# Patient Record
Sex: Male | Born: 1960 | Race: White | Hispanic: No | Marital: Single | Smoking: Former smoker
Health system: Southern US, Community
[De-identification: ages and names within clinical notes are randomized; demographics above are authoritative.]

## PROBLEM LIST (undated history)

## (undated) DIAGNOSIS — C20 Malignant neoplasm of rectum: Secondary | ICD-10-CM

## (undated) DIAGNOSIS — M179 Osteoarthritis of knee, unspecified: Secondary | ICD-10-CM

## (undated) DIAGNOSIS — K625 Hemorrhage of anus and rectum: Secondary | ICD-10-CM

## (undated) DIAGNOSIS — C801 Malignant (primary) neoplasm, unspecified: Secondary | ICD-10-CM

## (undated) DIAGNOSIS — R45851 Suicidal ideations: Secondary | ICD-10-CM

## (undated) DIAGNOSIS — F4320 Adjustment disorder, unspecified: Secondary | ICD-10-CM

## (undated) DIAGNOSIS — F418 Other specified anxiety disorders: Secondary | ICD-10-CM

## (undated) HISTORY — DX: Malignant (primary) neoplasm, unspecified: C80.1

## (undated) HISTORY — PX: ENTEROSTOMY CLOSURE: SUR260

## (undated) HISTORY — PX: HAND TENDON SURGERY: SHX663

## (undated) HISTORY — PX: CREATION / REVISION OF ILEOSTOMY / JEJUNOSTOMY: SUR354

## (undated) HISTORY — PX: PROCTECTOMY: SHX315

## (undated) HISTORY — PX: TONSILLECTOMY: SUR1361

## (undated) HISTORY — PX: FLEXIBLE SIGMOIDOSCOPY: SHX1649

---

## 2003-03-28 HISTORY — PX: OPEN REDUCTION INTERNAL FIXATION (ORIF) TIBIA/FIBULA FRACTURE: SHX5992

## 2004-01-24 ENCOUNTER — Emergency Department: Payer: Self-pay | Admitting: Emergency Medicine

## 2004-06-07 ENCOUNTER — Emergency Department: Payer: Self-pay | Admitting: Emergency Medicine

## 2005-12-02 ENCOUNTER — Emergency Department: Payer: Self-pay | Admitting: Emergency Medicine

## 2007-04-08 ENCOUNTER — Emergency Department: Payer: Self-pay | Admitting: Emergency Medicine
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
## 2008-10-23 ENCOUNTER — Emergency Department: Payer: Self-pay | Admitting: Emergency Medicine

## 2009-11-15 ENCOUNTER — Emergency Department: Payer: Self-pay | Admitting: Emergency Medicine
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
## 2010-03-06 ENCOUNTER — Emergency Department: Payer: Self-pay | Admitting: Emergency Medicine
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
## 2010-03-15 ENCOUNTER — Emergency Department: Payer: Self-pay | Admitting: Emergency Medicine

## 2010-10-11 ENCOUNTER — Emergency Department: Payer: Self-pay | Admitting: Emergency Medicine

## 2015-09-29 ENCOUNTER — Ambulatory Visit
Admission: RE | Admit: 2015-09-29 | Discharge: 2015-09-29 | Disposition: A | Discharge status: Home / Self Care | Payer: Disability Insurance | Source: Ambulatory Visit | Attending: Internal Medicine | Admitting: Internal Medicine

## 2015-09-29 ENCOUNTER — Other Ambulatory Visit: Payer: Self-pay | Admitting: Internal Medicine

## 2015-09-29 DIAGNOSIS — M25562 Pain in left knee: Secondary | ICD-10-CM

## 2015-09-29 DIAGNOSIS — Z9889 Other specified postprocedural states: Secondary | ICD-10-CM | POA: Insufficient documentation

## 2015-09-29 DIAGNOSIS — M1711 Unilateral primary osteoarthritis, right knee: Secondary | ICD-10-CM | POA: Insufficient documentation

## 2015-09-29 DIAGNOSIS — Z96653 Presence of artificial knee joint, bilateral: Secondary | ICD-10-CM

## 2015-09-29 DIAGNOSIS — M25561 Pain in right knee: Secondary | ICD-10-CM

## 2015-09-29 DIAGNOSIS — M2341 Loose body in knee, right knee: Secondary | ICD-10-CM | POA: Diagnosis not present
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
## 2016-07-25 ENCOUNTER — Emergency Department: Payer: Medicaid Other

## 2016-07-25 ENCOUNTER — Emergency Department
Admission: EM | Admit: 2016-07-25 | Discharge: 2016-07-25 | Disposition: A | Discharge status: Home / Self Care | Payer: Medicaid Other | Attending: Emergency Medicine | Admitting: Emergency Medicine

## 2016-07-25 DIAGNOSIS — S0240DA Maxillary fracture, left side, initial encounter for closed fracture: Secondary | ICD-10-CM

## 2016-07-25 DIAGNOSIS — Y999 Unspecified external cause status: Secondary | ICD-10-CM | POA: Diagnosis not present

## 2016-07-25 DIAGNOSIS — S060X9A Concussion with loss of consciousness of unspecified duration, initial encounter: Secondary | ICD-10-CM

## 2016-07-25 DIAGNOSIS — Y9259 Other trade areas as the place of occurrence of the external cause: Secondary | ICD-10-CM | POA: Diagnosis not present

## 2016-07-25 DIAGNOSIS — Z23 Encounter for immunization: Secondary | ICD-10-CM | POA: Insufficient documentation

## 2016-07-25 DIAGNOSIS — Y9389 Activity, other specified: Secondary | ICD-10-CM | POA: Diagnosis not present

## 2016-07-25 DIAGNOSIS — F10120 Alcohol abuse with intoxication, uncomplicated: Secondary | ICD-10-CM | POA: Insufficient documentation

## 2016-07-25 DIAGNOSIS — F10929 Alcohol use, unspecified with intoxication, unspecified: Secondary | ICD-10-CM

## 2016-07-25 DIAGNOSIS — S0990XA Unspecified injury of head, initial encounter: Secondary | ICD-10-CM | POA: Diagnosis present

## 2016-07-25 LAB — HEPATIC FUNCTION PANEL
ALBUMIN: 4.5 g/dL (ref 3.5–5.0)
ALT: 44 U/L (ref 17–63)
AST: 46 U/L — ABNORMAL HIGH (ref 15–41)
Alkaline Phosphatase: 78 U/L (ref 38–126)
Bilirubin, Direct: 0.1 mg/dL (ref 0.1–0.5)
Indirect Bilirubin: 0.4 mg/dL (ref 0.3–0.9)
TOTAL PROTEIN: 7.7 g/dL (ref 6.5–8.1)
Total Bilirubin: 0.5 mg/dL (ref 0.3–1.2)

## 2016-07-25 LAB — BASIC METABOLIC PANEL
Anion gap: 6 (ref 5–15)
BUN: 7 mg/dL (ref 6–20)
CALCIUM: 8.7 mg/dL — AB (ref 8.9–10.3)
CO2: 28 mmol/L (ref 22–32)
CREATININE: 0.75 mg/dL (ref 0.61–1.24)
Chloride: 106 mmol/L (ref 101–111)
GFR calc non Af Amer: >60 mL/min (ref >60)
Glucose, Bld: 106 mg/dL — ABNORMAL HIGH (ref 65–99)
Potassium: 4 mmol/L (ref 3.5–5.1)
SODIUM: 140 mmol/L (ref 135–145)

## 2016-07-25 LAB — CBC WITH DIFFERENTIAL/PLATELET
BASOS ABS: 0.1 10*3/uL (ref 0–0.1)
Basophils Relative: 1 %
EOS PCT: 0 %
Eosinophils Absolute: 0 10*3/uL (ref 0–0.7)
HEMATOCRIT: 40.1 % (ref 40.0–52.0)
Hemoglobin: 14 g/dL (ref 13.0–18.0)
LYMPHS PCT: 19 %
Lymphs Abs: 1.7 10*3/uL (ref 1.0–3.6)
MCH: 33.1 pg (ref 26.0–34.0)
MCHC: 34.9 g/dL (ref 32.0–36.0)
MCV: 95 fL (ref 80.0–100.0)
Monocytes Absolute: 0.5 10*3/uL (ref 0.2–1.0)
Monocytes Relative: 6 %
NEUTROS ABS: 6.5 10*3/uL (ref 1.4–6.5)
Neutrophils Relative %: 74 %
PLATELETS: 258 10*3/uL (ref 150–440)
RBC: 4.22 MIL/uL — ABNORMAL LOW (ref 4.40–5.90)
RDW: 13.4 % (ref 11.5–14.5)
WBC: 8.8 10*3/uL (ref 3.8–10.6)

## 2016-07-25 LAB — PROTIME-INR
INR: 1.03
Prothrombin Time: 13.5 seconds (ref 11.4–15.2)

## 2016-07-25 LAB — ETHANOL: ALCOHOL ETHYL (B): 226 mg/dL — AB (ref <5)
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
## 2016-07-25 MED ORDER — HYDROCODONE-ACETAMINOPHEN 5-325 MG PO TABS
2.0000 | ORAL_TABLET | Freq: Once | ORAL | Status: AC
  Administered 2016-07-25: 2 via ORAL
  Filled 2016-07-25: qty 2

## 2016-07-25 MED ORDER — AMOXICILLIN-POT CLAVULANATE 875-125 MG PO TABS
1.0000 | ORAL_TABLET | Freq: Once | ORAL | Status: AC
  Administered 2016-07-25: 1 via ORAL
  Filled 2016-07-25: qty 1

## 2016-07-25 MED ORDER — TETANUS-DIPHTH-ACELL PERTUSSIS 5-2.5-18.5 LF-MCG/0.5 IM SUSP
0.5000 mL | Freq: Once | INTRAMUSCULAR | Status: AC
  Administered 2016-07-25: 0.5 mL via INTRAMUSCULAR
  Filled 2016-07-25: qty 0.5

## 2016-07-25 MED ORDER — HYDROCODONE-ACETAMINOPHEN 5-325 MG PO TABS: 1.0000 | ORAL_TABLET | ORAL | 0 refills | Status: DC | PRN

## 2016-07-25 MED ORDER — AMOXICILLIN-POT CLAVULANATE 875-125 MG PO TABS: 1.0000 | ORAL_TABLET | Freq: Two times a day (BID) | ORAL | 0 refills | Status: AC
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
## 2016-09-11 DIAGNOSIS — B1920 Unspecified viral hepatitis C without hepatic coma: Secondary | ICD-10-CM | POA: Insufficient documentation

## 2019-01-07 ENCOUNTER — Other Ambulatory Visit: Payer: Self-pay

## 2019-01-07 DIAGNOSIS — Z20822 Contact with and (suspected) exposure to covid-19: Secondary | ICD-10-CM

## 2019-01-09 ENCOUNTER — Telehealth: Payer: Self-pay | Admitting: General Practice

## 2019-01-09 LAB — NOVEL CORONAVIRUS, NAA: SARS-CoV-2, NAA: NOT DETECTED

## 2019-01-09 NOTE — Telephone Encounter (Signed)
Negative COVID results given. Patient results "NOT Detected." Caller expressed understanding. ° °

## 2019-12-03 ENCOUNTER — Encounter: Payer: Self-pay | Admitting: *Deleted

## 2020-07-28 DIAGNOSIS — F319 Bipolar disorder, unspecified: Secondary | ICD-10-CM | POA: Insufficient documentation

## 2020-10-15 DIAGNOSIS — I609 Nontraumatic subarachnoid hemorrhage, unspecified: Secondary | ICD-10-CM

## 2020-10-15 HISTORY — DX: Nontraumatic subarachnoid hemorrhage, unspecified: I60.9

## 2020-10-15 HISTORY — PX: CLOSED REDUCTION HUMERAL EPICONDYLE FRACTURE: SUR222

## 2020-10-15 HISTORY — PX: CLOSED REDUCTION CLAVICULAR: SHX5006

## 2020-10-16 DIAGNOSIS — S065XAA Traumatic subdural hemorrhage with loss of consciousness status unknown, initial encounter: Secondary | ICD-10-CM | POA: Insufficient documentation

## 2020-10-19 DIAGNOSIS — F19980 Other psychoactive substance use, unspecified with psychoactive substance-induced anxiety disorder: Secondary | ICD-10-CM | POA: Insufficient documentation

## 2020-10-21 DIAGNOSIS — S42009A Fracture of unspecified part of unspecified clavicle, initial encounter for closed fracture: Secondary | ICD-10-CM | POA: Insufficient documentation

## 2020-11-22 ENCOUNTER — Other Ambulatory Visit: Payer: Self-pay | Admitting: Neurosurgery

## 2020-11-22 DIAGNOSIS — S065X9A Traumatic subdural hemorrhage with loss of consciousness of unspecified duration, initial encounter: Secondary | ICD-10-CM

## 2020-11-22 DIAGNOSIS — S065XAA Traumatic subdural hemorrhage with loss of consciousness status unknown, initial encounter: Secondary | ICD-10-CM

## 2020-12-13 ENCOUNTER — Other Ambulatory Visit: Payer: Self-pay

## 2020-12-13 ENCOUNTER — Ambulatory Visit
Admission: RE | Admit: 2020-12-13 | Discharge: 2020-12-13 | Disposition: A | Discharge status: Home / Self Care | Payer: Medicaid Other | Source: Ambulatory Visit | Attending: Neurosurgery | Admitting: Neurosurgery

## 2020-12-13 DIAGNOSIS — S065XAA Traumatic subdural hemorrhage with loss of consciousness status unknown, initial encounter: Secondary | ICD-10-CM

## 2020-12-13 DIAGNOSIS — S065X9A Traumatic subdural hemorrhage with loss of consciousness of unspecified duration, initial encounter: Secondary | ICD-10-CM | POA: Diagnosis present

## 2020-12-13 IMAGING — CT CT HEAD W/O CM
3 of 4 series · 13 of 47 positions shown, 15 images · non-contrast
Comparison: [DATE]

CLINICAL DATA: Dizziness

EXAM:
CT HEAD WITHOUT CONTRAST
TECHNIQUE: Contiguous axial images were obtained from the base of the skull
through the vertex without intravenous contrast.

[Series 2: axial st head 5.00 ax · axial · 0.33mm/px · z∈[-627,-529]mm · 7 of 28 slices shown, 9 images]
[im 4/28  brain]
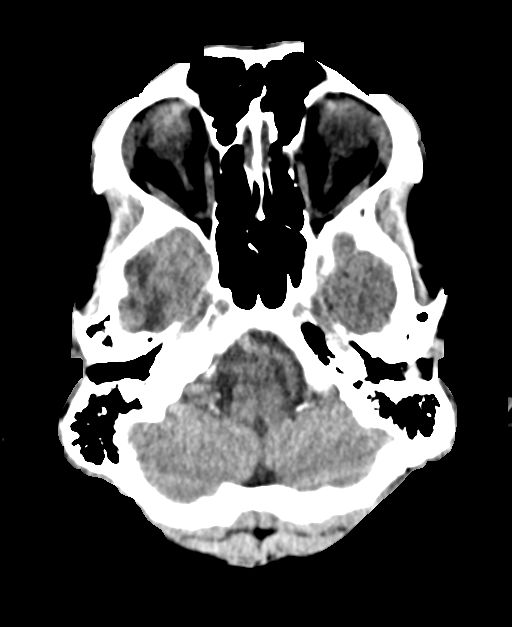
[im 4/28  bone]
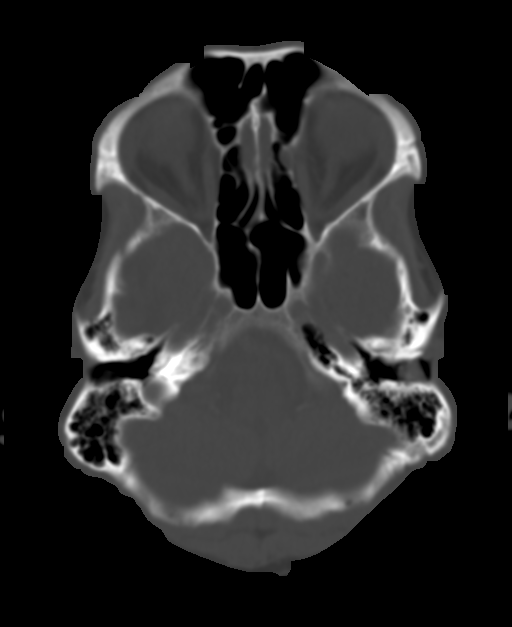
[im 7/28  brain]
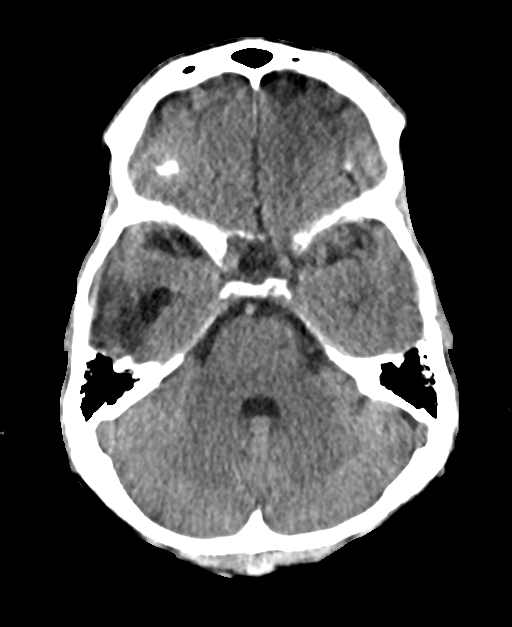
[im 11/28  brain]
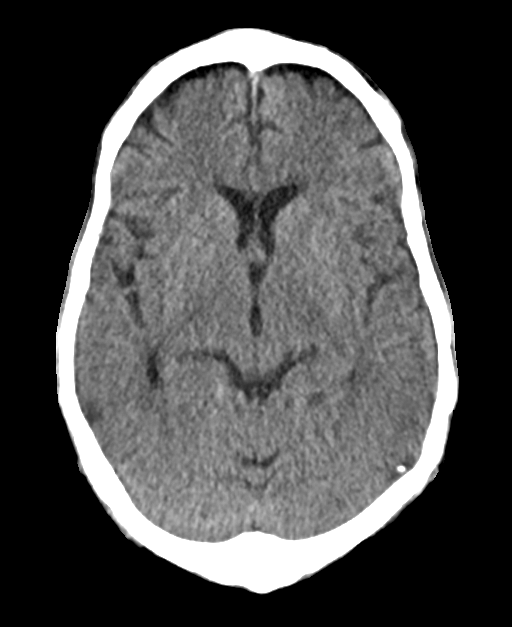
[im 14/28  brain]
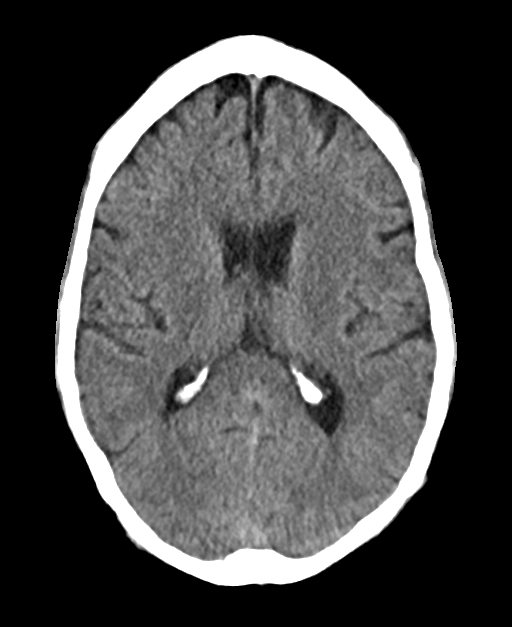
[im 17/28  brain]
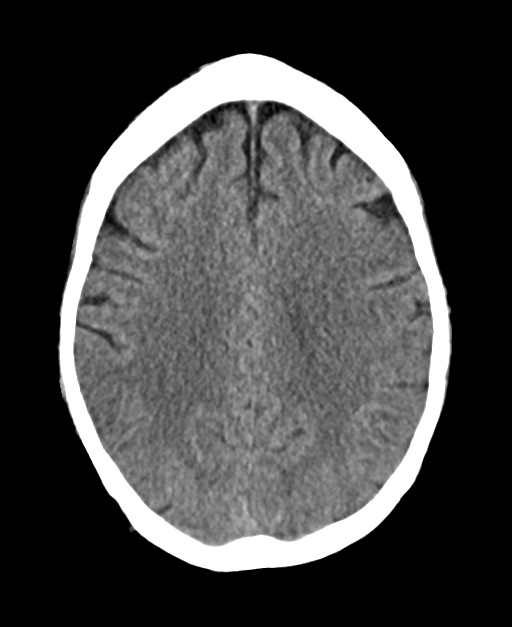
[im 17/28  bone]
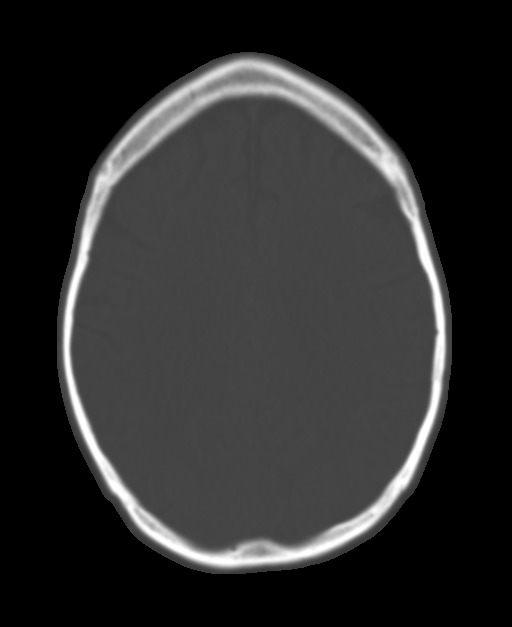
[im 21/28  brain]
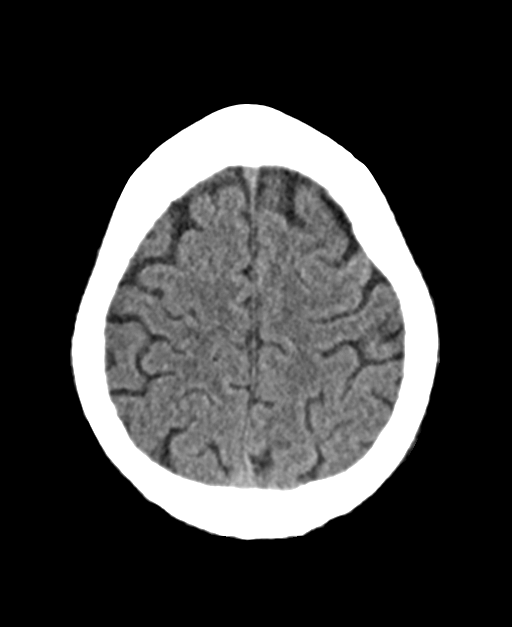
[im 24/28  brain]
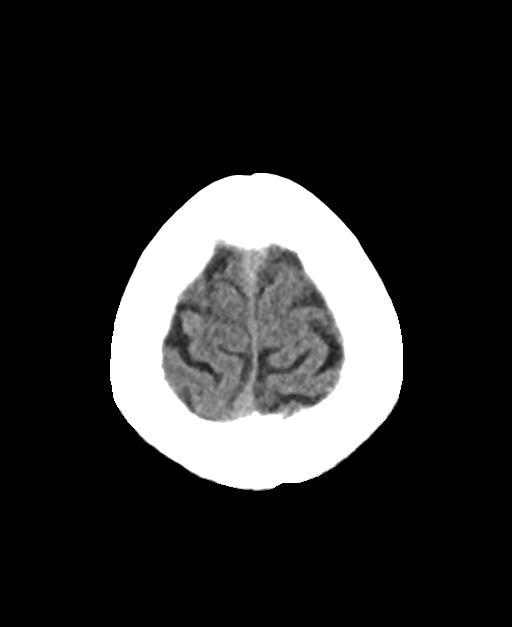

[Series 6: coronals head 3.00 cor · coronal · 0.28mm/px · 3 of 69 slices shown]
[im 23/69  brain]
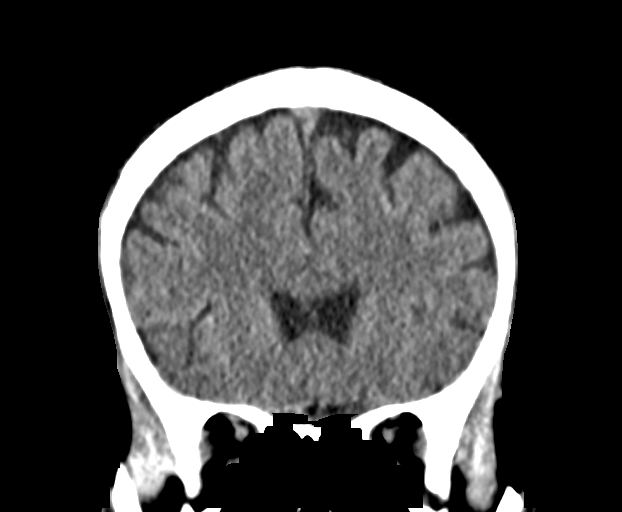
[im 31/69  brain]
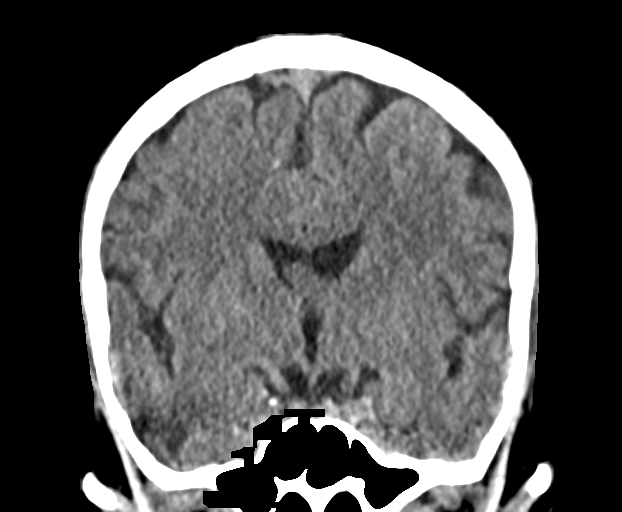
[im 38/69  brain]
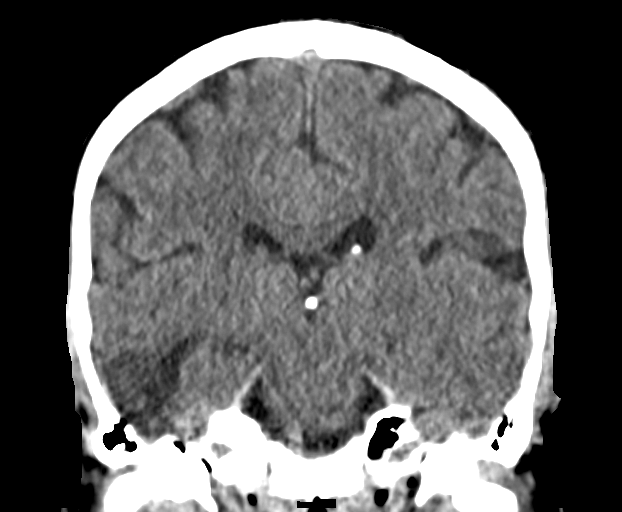

[Series 8: sagittals head 3.00 sag · sagittal · 0.28mm/px · 3 of 57 slices shown]
[im 19/57  brain]
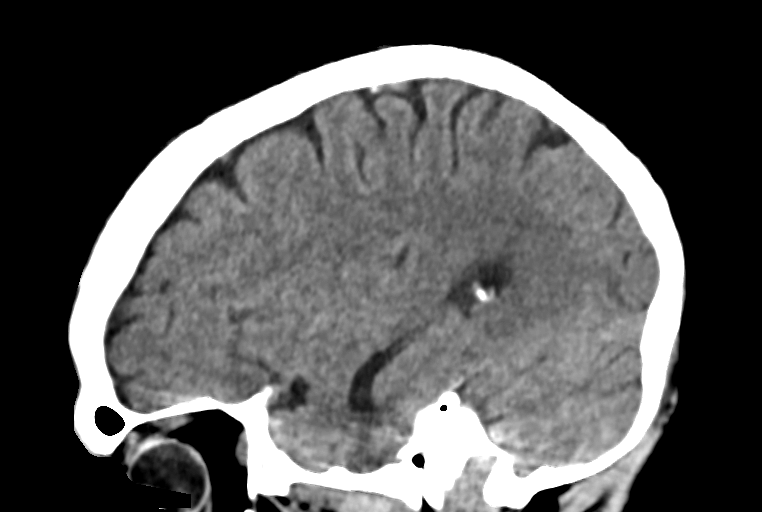
[im 29/57  brain]
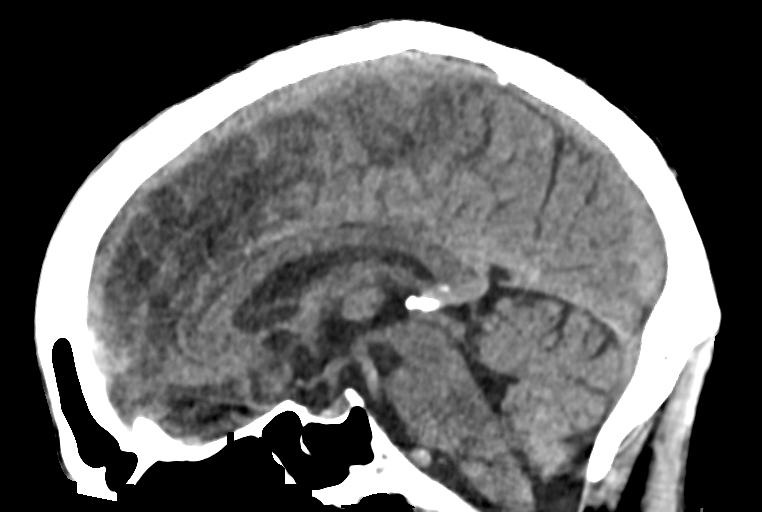
[im 38/57  brain]
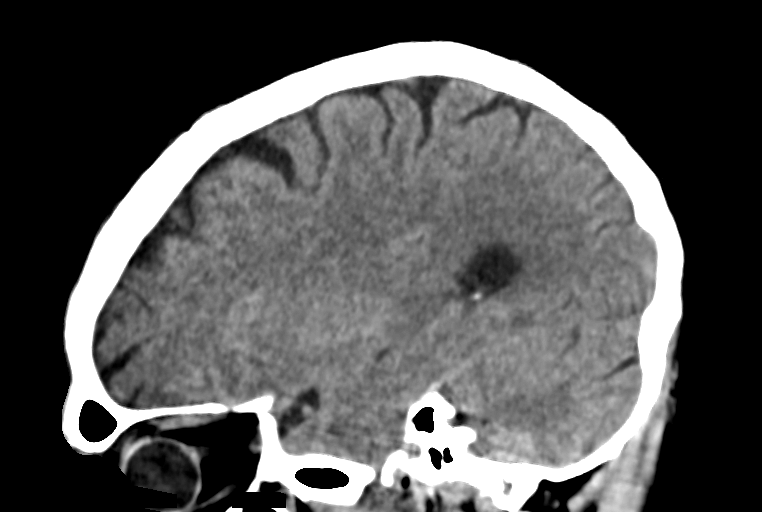

[13 of 47 positions shown; findings below may reference images not displayed]

FINDINGS: Brain: Low-density in the right temporal lobe compatible with
infarct. This is age indeterminate, favor subacute to chronic. The
temporal horn of the right lateral ventricle is mildly dilated,
likely related to ex vacuo dilatation of the infarct. No hemorrhage
or hydrocephalus. No mass effect or midline shift.

Vascular: No hyperdense vessel or unexpected calcification.

Skull: No acute calvarial abnormality.

Sinuses/Orbits: Old left medial orbital wall blowout fracture,
stable since prior study. No acute findings

Other: None
IMPRESSION: Age indeterminate right temporal infarct, favor subacute to chronic
given dilatation of the temporal horn of the lateral ventricle. This
could be further evaluated with MRI if felt clinically indicated.

These results will be called to the ordering clinician or
representative by the Radiologist Assistant, and communication
documented in the PACS or [REDACTED].

## 2020-12-15 ENCOUNTER — Other Ambulatory Visit: Payer: Self-pay | Admitting: Neurosurgery

## 2020-12-15 DIAGNOSIS — H9313 Tinnitus, bilateral: Secondary | ICD-10-CM

## 2020-12-15 DIAGNOSIS — S065XAA Traumatic subdural hemorrhage with loss of consciousness status unknown, initial encounter: Secondary | ICD-10-CM

## 2020-12-15 DIAGNOSIS — S065X9A Traumatic subdural hemorrhage with loss of consciousness of unspecified duration, initial encounter: Secondary | ICD-10-CM

## 2020-12-29 ENCOUNTER — Ambulatory Visit
Admission: RE | Admit: 2020-12-29 | Discharge: 2020-12-29 | Disposition: A | Discharge status: Home / Self Care | Payer: Medicaid Other | Source: Ambulatory Visit | Attending: Neurosurgery | Admitting: Neurosurgery

## 2020-12-29 ENCOUNTER — Other Ambulatory Visit: Payer: Self-pay

## 2020-12-29 DIAGNOSIS — H9313 Tinnitus, bilateral: Secondary | ICD-10-CM | POA: Insufficient documentation

## 2020-12-29 DIAGNOSIS — S065XAA Traumatic subdural hemorrhage with loss of consciousness status unknown, initial encounter: Secondary | ICD-10-CM | POA: Diagnosis present

## 2020-12-29 IMAGING — MR MR HEAD W/O CM
12 series · 44 of 48 positions shown · non-contrast
Comparison: Prior head CT examinations [DATE] and earlier.

CLINICAL DATA: Subdural hematoma.  Tinnitus of both ears.

EXAM:
MRI HEAD WITHOUT CONTRAST
TECHNIQUE: Multiplanar, multiecho pulse sequences of the brain and surrounding
structures were obtained without intravenous contrast.

[Series 5: ax dwi_tracew · axial · 3.0mm · 0.65mm/px · z∈[-76,+78]mm · 4 of 48 slices shown]
[im 1/48]
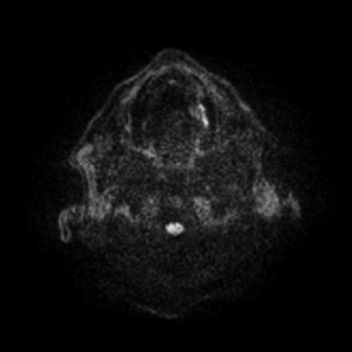
[im 16/48]
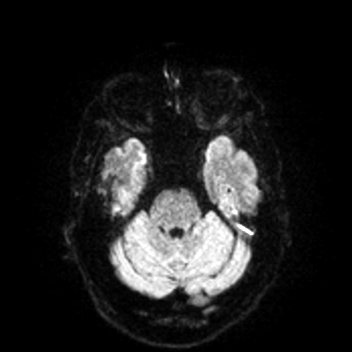
[im 32/48]
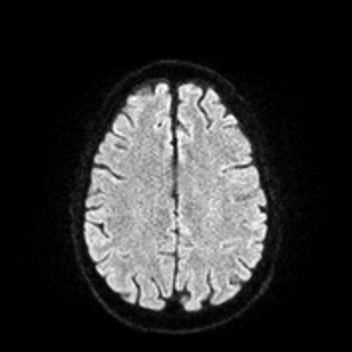
[im 48/48]
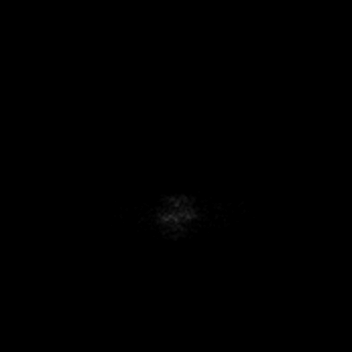

[Series 6: ax dwi_adc · axial · 3.0mm · 0.65mm/px · z∈[-76,+78]mm · 4 of 48 slices shown]
[im 1/48]
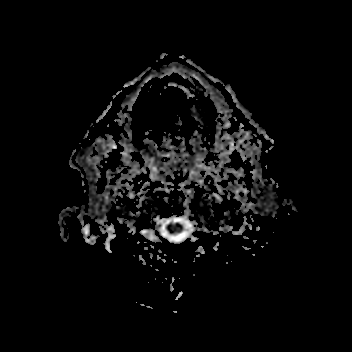
[im 16/48]
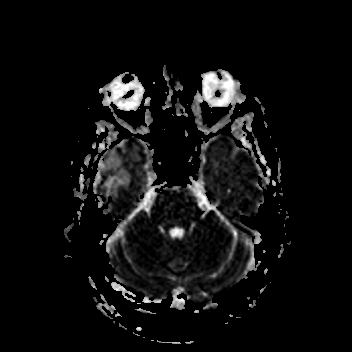
[im 32/48]
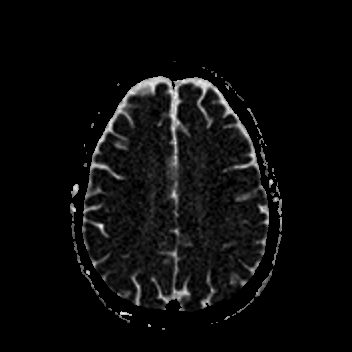
[im 48/48]
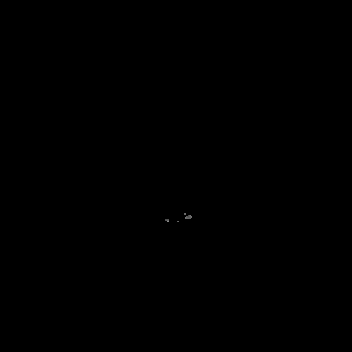

[Series 7: cor dwi_tracew · coronal · 5.0mm · 0.60mm/px · 3 of 38 slices shown]
[im 1/38]
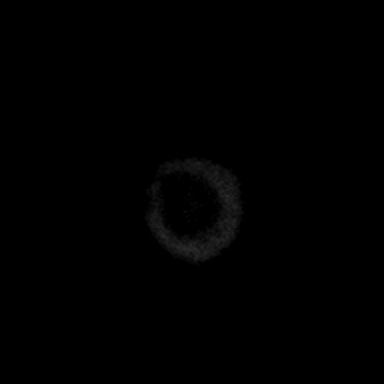
[im 19/38]
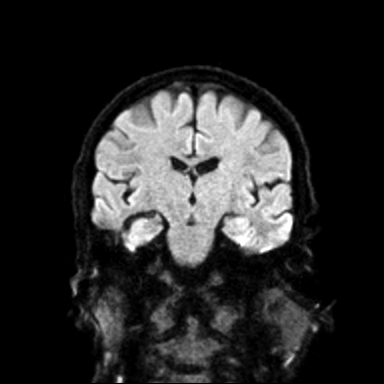
[im 38/38]
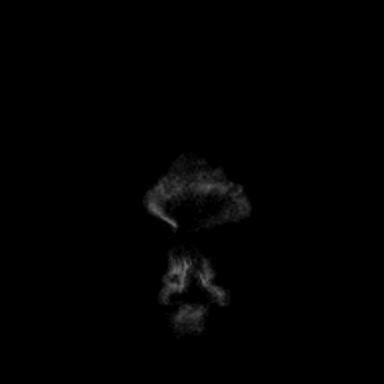

[Series 8: cor dwi_adc · coronal · 5.0mm · 0.60mm/px · 3 of 38 slices shown]
[im 1/38]
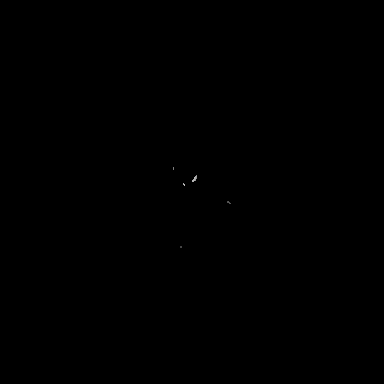
[im 19/38]
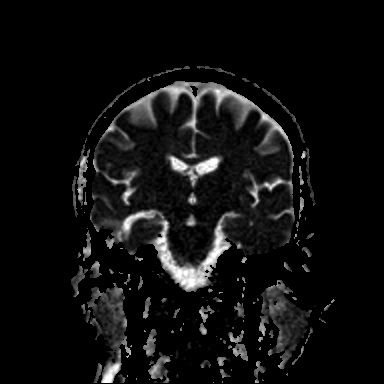
[im 38/38]
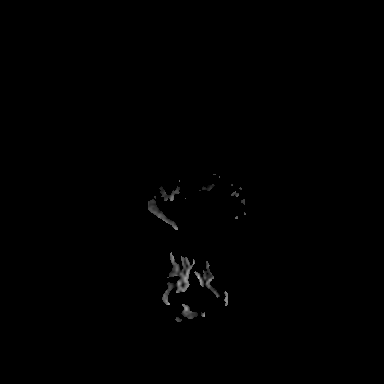

[Series 9: T1 · sagittal · 5.0mm · 0.62mm/px · 2 of 23 slices shown (1 of 2)]
[im 1/23]
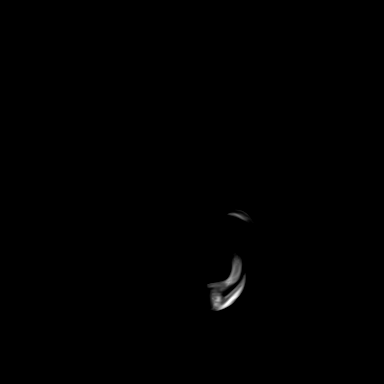
[im 23/23]
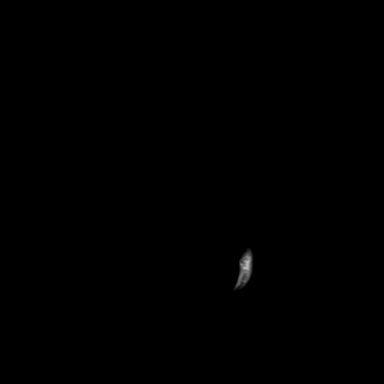

[Series 10: T2 · axial · 5.0mm · 0.53mm/px · z∈[-77,+77]mm · 2 of 27 slices shown (1 of 2)]
[im 1/27]
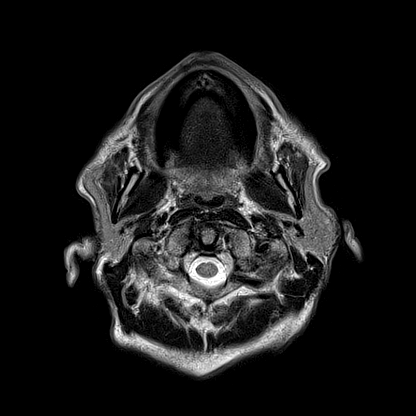
[im 27/27]
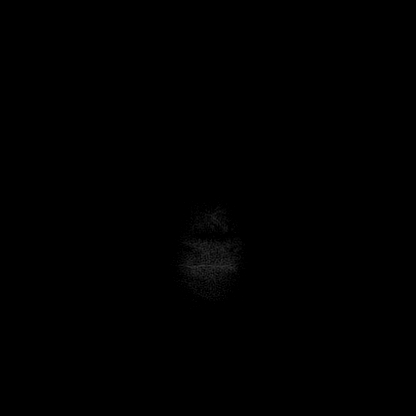

[Series 11: mag_images · axial · 3.0mm · 0.90mm/px · z∈[-87,+88]mm · 4 of 60 slices shown]
[im 1/60]
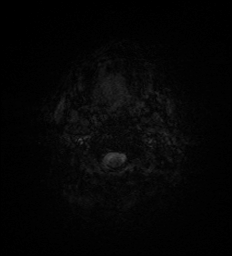
[im 20/60]
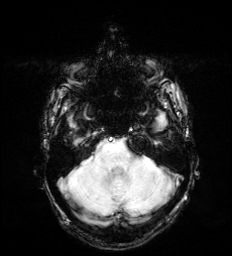
[im 40/60]
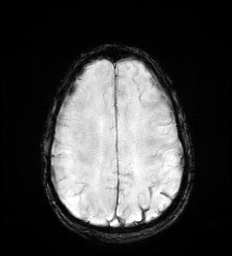
[im 60/60]
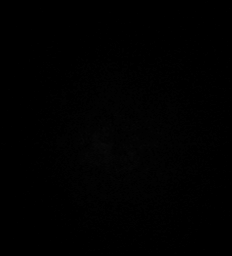

[Series 12: pha_images · axial · 3.0mm · 0.90mm/px · z∈[-87,+88]mm · 4 of 58 slices shown]
[im 1/58]
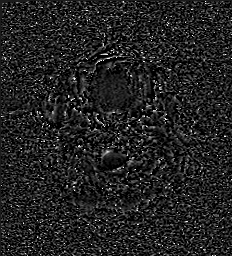
[im 20/58]
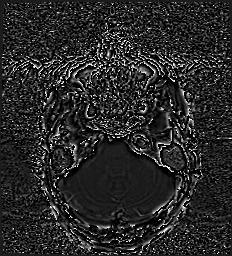
[im 39/58]
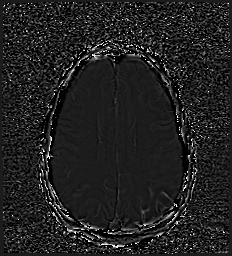
[im 58/58]
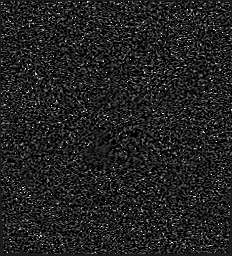

[Series 13: swi_images · axial · 3.0mm · 0.90mm/px · z∈[-87,+88]mm · 4 of 60 slices shown]
[im 1/60]
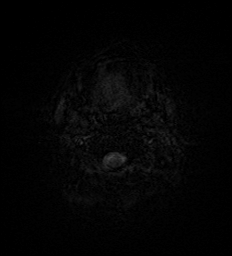
[im 20/60]
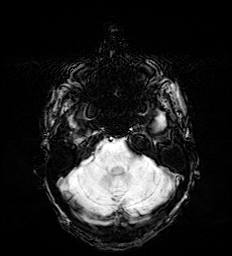
[im 40/60]
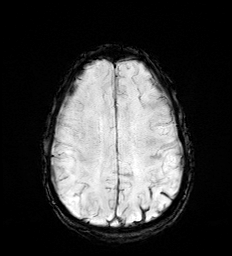
[im 60/60]
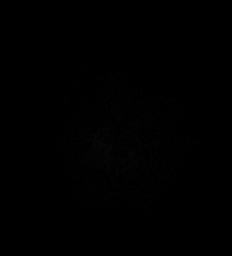

[Series 15: FLAIR · axial · 3.0mm · 0.53mm/px · z∈[-80,+80]mm · 4 of 55 slices shown]
[im 1/55]
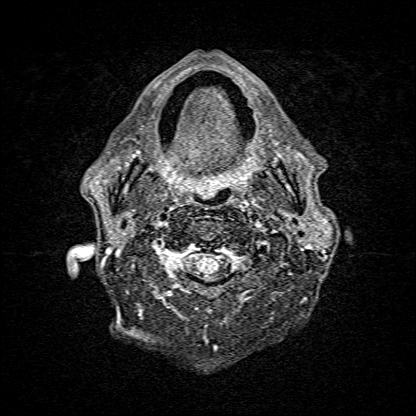
[im 19/55]
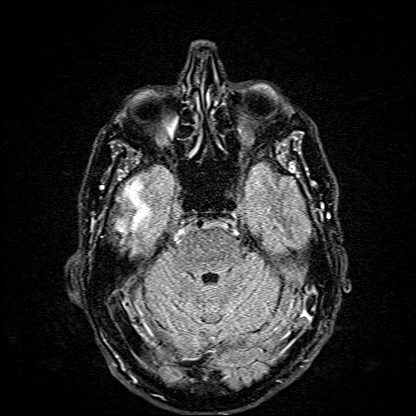
[im 37/55]
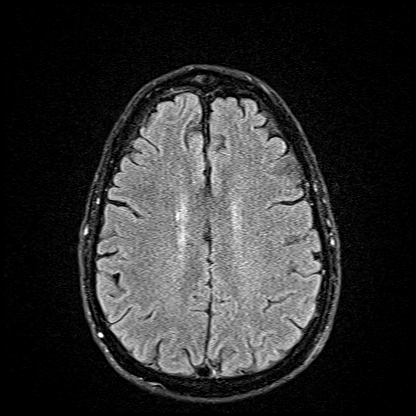
[im 55/55]
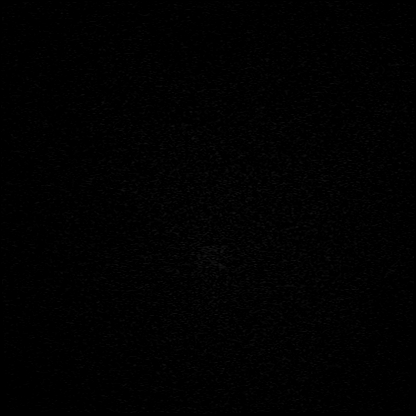

[Series 16: T1 · axial · 1.0mm · 0.98mm/px · z∈[-76,+81]mm · 8 of 160 slices shown (2 of 2)]
[im 1/160]
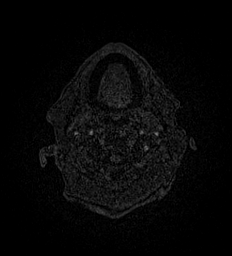
[im 29/160]
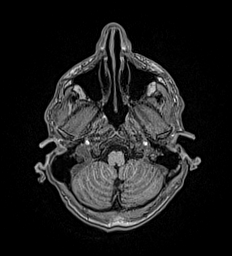
[im 44/160]
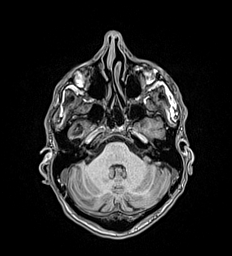
[im 73/160]
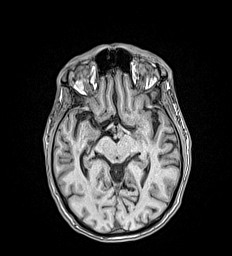
[im 87/160]
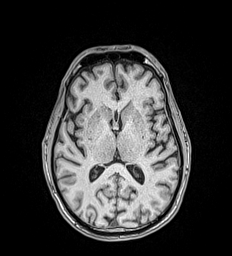
[im 116/160]
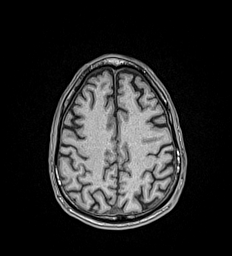
[im 131/160]
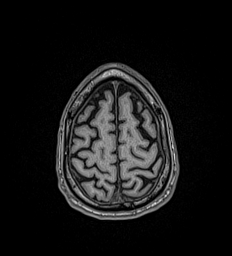
[im 160/160]
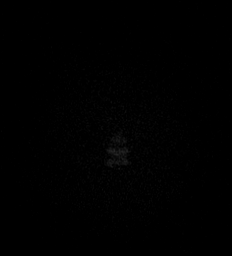

[Series 17: T2 · coronal · 5.0mm · 0.57mm/px · 2 of 29 slices shown (2 of 2)]
[im 1/29]
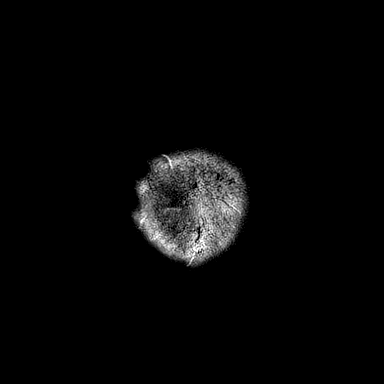
[im 29/29]
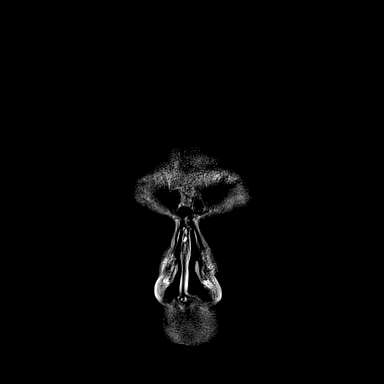

[44 of 48 positions shown; findings below may reference images not displayed]

FINDINGS: Brain:

Redemonstrated focus of chronic encephalomalacia/gliosis within the
lateral right temporal lobe with associated chronic hemosiderin
deposition. This may be posttraumatic in etiology or may reflect a
chronic infarct. Associated ex vacuo dilatation of the temporal horn
of the right lateral ventricle.

Background cerebral volume is normal for age.

Curvilinear SWI signal loss along the posterior aspects of the left
greater than right cerebral hemispheres and along the cerebellum and
brainstem, compatible with sequela of remote subarachnoid
hemorrhage.

There is no acute infarct.

No evidence of an intracranial mass.

No extra-axial fluid collection.

No midline shift.

Vascular: Maintained flow voids within the proximal large arterial
vessels.

Skull and upper cervical spine: No focal suspicious marrow lesion.

Sinuses/Orbits: Visualized orbits show no acute finding.
Redemonstrated chronic medially displaced fracture deformity of the
left lamina papyracea. Trace scattered paranasal sinus mucosal
thickening at the imaged levels.
IMPRESSION: No evidence of acute intracranial abnormality.

Redemonstrated focus of chronic encephalomalacia/gliosis within the
lateral right temporal lobe. This may be posttraumatic in etiology
or may reflect a chronic infarct.

Curvilinear SWI signal loss along the posterior aspects of the left
greater than right cerebral hemispheres, and along the cerebellum
and brainstem, compatible with sequela of remote subarachnoid
hemorrhage.

Otherwise unremarkable non-contrast MRI appearance of the brain for
age.

## 2021-01-09 ENCOUNTER — Other Ambulatory Visit: Payer: Self-pay

## 2021-01-09 ENCOUNTER — Inpatient Hospital Stay
Admission: EM | Admit: 2021-01-09 | Discharge: 2021-01-13 | DRG: 374 | Disposition: A | Discharge status: Home / Self Care | Payer: Medicaid Other | Attending: Internal Medicine | Admitting: Internal Medicine

## 2021-01-09 DIAGNOSIS — Z681 Body mass index (BMI) 19 or less, adult: Secondary | ICD-10-CM

## 2021-01-09 DIAGNOSIS — F419 Anxiety disorder, unspecified: Secondary | ICD-10-CM | POA: Diagnosis present

## 2021-01-09 DIAGNOSIS — K6289 Other specified diseases of anus and rectum: Secondary | ICD-10-CM | POA: Diagnosis present

## 2021-01-09 DIAGNOSIS — Z20822 Contact with and (suspected) exposure to covid-19: Secondary | ICD-10-CM | POA: Diagnosis present

## 2021-01-09 DIAGNOSIS — K625 Hemorrhage of anus and rectum: Secondary | ICD-10-CM | POA: Diagnosis present

## 2021-01-09 DIAGNOSIS — E43 Unspecified severe protein-calorie malnutrition: Secondary | ICD-10-CM | POA: Insufficient documentation

## 2021-01-09 DIAGNOSIS — F32A Depression, unspecified: Secondary | ICD-10-CM | POA: Diagnosis present

## 2021-01-09 DIAGNOSIS — D500 Iron deficiency anemia secondary to blood loss (chronic): Secondary | ICD-10-CM | POA: Diagnosis present

## 2021-01-09 DIAGNOSIS — C7802 Secondary malignant neoplasm of left lung: Secondary | ICD-10-CM | POA: Diagnosis present

## 2021-01-09 DIAGNOSIS — C20 Malignant neoplasm of rectum: Principal | ICD-10-CM

## 2021-01-09 DIAGNOSIS — Z87891 Personal history of nicotine dependence: Secondary | ICD-10-CM

## 2021-01-09 DIAGNOSIS — Z818 Family history of other mental and behavioral disorders: Secondary | ICD-10-CM

## 2021-01-09 DIAGNOSIS — K921 Melena: Secondary | ICD-10-CM | POA: Diagnosis present

## 2021-01-09 DIAGNOSIS — C19 Malignant neoplasm of rectosigmoid junction: Secondary | ICD-10-CM

## 2021-01-09 DIAGNOSIS — K922 Gastrointestinal hemorrhage, unspecified: Secondary | ICD-10-CM

## 2021-01-09 DIAGNOSIS — K3189 Other diseases of stomach and duodenum: Secondary | ICD-10-CM | POA: Diagnosis present

## 2021-01-09 HISTORY — DX: Osteoarthritis of knee, unspecified: M17.9

## 2021-01-09 HISTORY — DX: Other specified anxiety disorders: F41.8

## 2021-01-09 HISTORY — DX: Hemorrhage of anus and rectum: K62.5

## 2021-01-09 LAB — TYPE AND SCREEN
ABO/RH(D): O POS
Antibody Screen: NEGATIVE

## 2021-01-09 LAB — COMPREHENSIVE METABOLIC PANEL
ALT: 23 U/L (ref 0–44)
AST: 25 U/L (ref 15–41)
Albumin: 4.6 g/dL (ref 3.5–5.0)
Alkaline Phosphatase: 100 U/L (ref 38–126)
Anion gap: 8 (ref 5–15)
BUN: 14 mg/dL (ref 8–23)
CO2: 28 mmol/L (ref 22–32)
Calcium: 9.5 mg/dL (ref 8.9–10.3)
Chloride: 103 mmol/L (ref 98–111)
Creatinine, Ser: 0.78 mg/dL (ref 0.61–1.24)
GFR, Estimated: >60 mL/min (ref >60)
Glucose, Bld: 132 mg/dL — ABNORMAL HIGH (ref 70–99)
Potassium: 3.9 mmol/L (ref 3.5–5.1)
Sodium: 139 mmol/L (ref 135–145)
Total Bilirubin: 0.7 mg/dL (ref 0.3–1.2)
Total Protein: 7.5 g/dL (ref 6.5–8.1)

## 2021-01-09 LAB — CBC
HCT: 39.6 % (ref 39.0–52.0)
Hemoglobin: 13.5 g/dL (ref 13.0–17.0)
MCH: 32 pg (ref 26.0–34.0)
MCHC: 34.1 g/dL (ref 30.0–36.0)
MCV: 93.8 fL (ref 80.0–100.0)
Platelets: 267 10*3/uL (ref 150–400)
RBC: 4.22 MIL/uL (ref 4.22–5.81)
RDW: 12.5 % (ref 11.5–15.5)
WBC: 12.1 10*3/uL — ABNORMAL HIGH (ref 4.0–10.5)
nRBC: 0 % (ref 0.0–0.2)

## 2021-01-09 LAB — RESP PANEL BY RT-PCR (FLU A&B, COVID) ARPGX2
Influenza A by PCR: NEGATIVE
Influenza B by PCR: NEGATIVE
SARS Coronavirus 2 by RT PCR: NEGATIVE

## 2021-01-09 LAB — HEMOGLOBIN AND HEMATOCRIT, BLOOD
HCT: 33.2 % — ABNORMAL LOW (ref 39.0–52.0)
Hemoglobin: 11.6 g/dL — ABNORMAL LOW (ref 13.0–17.0)

## 2021-01-09 MED ORDER — SODIUM CHLORIDE 0.9 % IV SOLN: 250.0000 mL | INTRAVENOUS | Status: DC | PRN

## 2021-01-09 MED ORDER — ACETAMINOPHEN 325 MG PO TABS: 650.0000 mg | ORAL_TABLET | Freq: Four times a day (QID) | ORAL | Status: DC | PRN

## 2021-01-09 MED ORDER — LORAZEPAM 0.5 MG PO TABS
0.5000 mg | ORAL_TABLET | Freq: Three times a day (TID) | ORAL | Status: DC
  Administered 2021-01-09 – 2021-01-10 (×3): 0.5 mg via ORAL
  Filled 2021-01-09 (×3): qty 1

## 2021-01-09 MED ORDER — SODIUM CHLORIDE 0.9% FLUSH
3.0000 mL | Freq: Two times a day (BID) | INTRAVENOUS | Status: DC
  Administered 2021-01-09 – 2021-01-13 (×6): 3 mL via INTRAVENOUS

## 2021-01-09 MED ORDER — ACETAMINOPHEN 650 MG RE SUPP
650.0000 mg | Freq: Four times a day (QID) | RECTAL | Status: DC | PRN
  Filled 2021-01-09: qty 1

## 2021-01-09 MED ORDER — SODIUM CHLORIDE 0.9% FLUSH: 3.0000 mL | INTRAVENOUS | Status: DC | PRN
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
## 2021-01-09 NOTE — ED Provider Notes (Signed)
[facility] Emergency Department Provider Note  Time seen: 3:07 PM  I have reviewed the triage vital signs and the nursing notes.   HISTORY  Chief Complaint Melena   HPI [name] [name] is a 60 y.o. male with no significant past medical history who presents to the emergency department for GI bleeding.  According to the patient over the past 2 months or so he has noted intermittently a small amount of blood at times when he wipes.  He states however this morning he has had 2 large bowel movements of nothing but blood per patient.  States the blood filled the toilet.  Patient denies any abdominal pain denies any nausea or vomiting..  Patient states he has never had a colonoscopy.  Does not take any blood thinners.  History reviewed. No pertinent past medical history.  There are no problems to display for this patient.   History reviewed. No pertinent surgical history.  Prior to Admission medications   Medication Sig Start Date End Date Taking? Authorizing Provider  HYDROcodone-acetaminophen (NORCO) 5-325 MG tablet Take 1 tablet by mouth every 4 (four) hours as needed for moderate pain. 07/25/16   Darel Hong, MD    No Known Allergies  History reviewed. No pertinent family history.  Social History    Review of Systems Constitutional: Negative for fever. Cardiovascular: Negative for chest pain. Respiratory: Negative for shortness of breath. Gastrointestinal: Negative for abdominal pain positive for GI bleed x2 this morning. Genitourinary: Negative for urinary compaints Musculoskeletal: Negative for musculoskeletal complaints Neurological: Negative for headache All other ROS negative  ____________________________________________   PHYSICAL EXAM:  VITAL SIGNS: ED Triage Vitals  Enc Vitals Group     BP 01/09/21 1314 (!) 125/93     Pulse Rate 01/09/21 1314 88     Resp 01/09/21 1314 19     Temp 01/09/21 1314 98.6 F (37 C)     Temp Source  01/09/21 1314 Oral     SpO2 01/09/21 1314 100 %     Weight 01/09/21 1315 135 lb (61.2 kg)     Height 01/09/21 1315 5\' 6"  (1.676 m)     Head Circumference --      Peak Flow --      Pain Score 01/09/21 1314 0     Pain Loc --      Pain Edu? --      Excl. in [place]? --    Constitutional: Alert and oriented. Well appearing and in no distress. Eyes: Normal exam ENT      Head: Normocephalic and atraumatic.      Mouth/Throat: Mucous membranes are moist. Cardiovascular: Normal rate, regular rhythm. Respiratory: Normal respiratory effort without tachypnea nor retractions. Breath sounds are clear  Gastrointestinal: Soft and nontender. No distention.  Rectal examination shows no obvious hemorrhoids, gross blood on exam, guaiac positive.  Formed stool noted in the rectal vault. Musculoskeletal: Nontender with normal range of motion in all extremities.  Neurologic:  Normal speech and language. No gross focal neurologic deficits  Skin:  Skin is warm, dry and intact.  Psychiatric: Mood and affect are normal.   ____________________________________________  INITIAL IMPRESSION / ASSESSMENT AND PLAN / ED COURSE  Pertinent labs & imaging results that were available during my care of the patient were reviewed by me and considered in my medical decision making (see chart for details).   Patient presents to the emergency department for rectal bleeding.  Rectal bleeding states has been intermittent over the past 2 months  however this morning he has had 2 large bowel movements of nothing but blood per patient.  Gross blood on rectal examination although formed stool noted in the rectal vault.  Patient's lab work is reassuring with a normal hemoglobin 13.5 however given the 2 large bloody bowel movements with gross blood on rectal examination we will admit to the hospitalist, likely GI consultation, trending hemoglobins and further work-up.  [name] [name] was evaluated in Emergency Department on 01/09/2021 for the  symptoms described in the history of present illness. He was evaluated in the context of the global COVID-19 pandemic, which necessitated consideration that the patient might be at risk for infection with the SARS-CoV-2 virus that causes COVID-19. Institutional protocols and algorithms that pertain to the evaluation of patients at risk for COVID-19 are in a state of rapid change based on information released by regulatory bodies including the CDC and federal and state organizations. These policies and algorithms were followed during the patient's care in the ED.  ____________________________________________   FINAL CLINICAL IMPRESSION(S) / ED DIAGNOSES  GI bleed   Harvest Dark, MD 01/09/21 1521

## 2021-01-10 DIAGNOSIS — K625 Hemorrhage of anus and rectum: Secondary | ICD-10-CM | POA: Diagnosis not present

## 2021-01-10 DIAGNOSIS — E43 Unspecified severe protein-calorie malnutrition: Secondary | ICD-10-CM | POA: Insufficient documentation

## 2021-01-10 LAB — HIV ANTIBODY (ROUTINE TESTING W REFLEX): HIV Screen 4th Generation wRfx: NONREACTIVE

## 2021-01-10 LAB — HEMOGLOBIN AND HEMATOCRIT, BLOOD
HCT: 34.7 % — ABNORMAL LOW (ref 39.0–52.0)
HCT: 35.9 % — ABNORMAL LOW (ref 39.0–52.0)
Hemoglobin: 12.2 g/dL — ABNORMAL LOW (ref 13.0–17.0)
Hemoglobin: 12.4 g/dL — ABNORMAL LOW (ref 13.0–17.0)

## 2021-01-10 LAB — HEMOGLOBIN A1C
Hgb A1c MFr Bld: 5.1 % (ref 4.8–5.6)
Mean Plasma Glucose: 100 mg/dL

## 2021-01-10 MED ORDER — BISACODYL 5 MG PO TBEC
10.0000 mg | DELAYED_RELEASE_TABLET | Freq: Once | ORAL | Status: AC
  Administered 2021-01-10: 10 mg via ORAL
  Filled 2021-01-10: qty 2

## 2021-01-10 MED ORDER — PEG 3350-KCL-NA BICARB-NACL 420 G PO SOLR
2000.0000 mL | Freq: Once | ORAL | Status: AC
  Administered 2021-01-11: 2000 mL via ORAL
  Filled 2021-01-10: qty 4000

## 2021-01-10 MED ORDER — HYDROXYZINE HCL 25 MG PO TABS
25.0000 mg | ORAL_TABLET | Freq: Three times a day (TID) | ORAL | Status: DC | PRN
  Filled 2021-01-10: qty 1

## 2021-01-10 MED ORDER — BOOST / RESOURCE BREEZE PO LIQD CUSTOM
1.0000 | Freq: Three times a day (TID) | ORAL | Status: DC
  Administered 2021-01-11 – 2021-01-12 (×2): 1 via ORAL

## 2021-01-10 NOTE — Progress Notes (Signed)
Initial Nutrition Assessment  DOCUMENTATION CODES:   Severe malnutrition in context of social or environmental circumstances  INTERVENTION:   Boost Breeze po TID, each supplement provides 250 kcal and 9 grams of protein  Pt at high refeed risk; recommend monitor potassium, magnesium and phosphorus labs daily until stable  NUTRITION DIAGNOSIS:   Severe Malnutrition related to social / environmental circumstances as evidenced by moderate fat depletion, severe fat depletion, moderate muscle depletion, severe muscle depletion.  GOAL:   Patient will meet greater than or equal to 90% of their needs  MONITOR:   PO intake, Supplement acceptance, Labs, Diet advancement, Weight trends, Skin, I & O's  REASON FOR ASSESSMENT:   Malnutrition Screening Tool    ASSESSMENT:   60 year-old caucasian male with history of hepatitis C status post treatment, SAH (July 2022), anxiety and depression who is admitted with hematochezia.  Met with pt in room today. Pt reports good appetite and oral intake pta. Pt reports that he is hungry today and wants to eat. Pt currently on clear liquid diet for EGD/colonoscopy tomorrow.  Pt reports that despite eating well at home and drinking chocolate Ensure that he has continued to loose weight over the past 3 months. Pt reports that his UBW is ~145lbs; this would be a 23lbs(16%) over the past 3 months which would be significant. There is no documented weight history in chart to confirm weight loss. RD will add supplements to help pt meet his estimated needs. Pt prefers peach Boost Breeze and chocolate Ensure once his diet is advanced.   Medications reviewed and include:   Labs reviewed: Hgb 12.4(L), Hct 35.9(L)  NUTRITION - FOCUSED PHYSICAL EXAM:  Flowsheet Row Most Recent Value  Orbital Region Moderate depletion  Upper Arm Region Severe depletion  Thoracic and Lumbar Region Severe depletion  Buccal Region Moderate depletion  Temple Region Moderate  depletion  Clavicle Bone Region Severe depletion  Clavicle and Acromion Bone Region Severe depletion  Scapular Bone Region Severe depletion  Dorsal Hand Severe depletion  Patellar Region Severe depletion  Anterior Thigh Region Severe depletion  Posterior Calf Region Severe depletion  Edema (RD Assessment) None  Hair Reviewed  Eyes Reviewed  Mouth Reviewed  Skin Reviewed  Nails Reviewed   Diet Order:   Diet Order             Diet clear liquid Room service appropriate? Yes; Fluid consistency: Thin  Diet effective now                  EDUCATION NEEDS:   Education needs have been addressed  Skin:  Skin Assessment: Reviewed RN Assessment  Last BM:  10/16- type 6  Height:   Ht Readings from Last 1 Encounters:  01/09/21 5' 6"  (1.676 m)    Weight:   Wt Readings from Last 1 Encounters:  01/09/21 55.4 kg    Ideal Body Weight:  64.5 kg  BMI:  Body mass index is 19.72 kg/m.  Estimated Nutritional Needs:   Kcal:  1700-2000kcal/day  Protein:  85-100g/day  Fluid:  1.7-1.9L/day  Koleen Distance MS, RD, LDN Please refer to [facility] for RD and/or RD on-call/weekend/after hours pager

## 2021-01-10 NOTE — Progress Notes (Addendum)
PROGRESS NOTE    [name] [name]  CHY:[id number] DOB: 07/28/1960 DOA: 01/09/2021 PCP: [facility] [place]  214A/214A-AA   Assessment & Plan:   Active Problems:   BRBPR (bright red blood per rectum)   Anxiety and depression   Protein-calorie malnutrition, severe   [name] [name] is a 60 y.o. male with medical history significant of no significant past medical history who presents to the emergency department for GI bleeding.  According to the patient over the past 2 months or so he has noted intermittently a small amount of blood at times when he wipes.  He states however this morning he has had 2 large bowel movements of nothing but blood per patient.  States the blood filled the toilet.  Patient denies any abdominal pain denies any nausea or vomiting.  Patient states he has never had a colonoscopy.  Does not take any blood thinners.    BRBPR  --painless.  Likely diverticular bleeds --Hgb currently stable ~12. Plan: --EGD and colonoscopy tomorrow   Anxiety and depression  - admits to significant anxiety as a long term problem. Also gives history of depression. Both symptoms are worse since his brothers suicide and the loss of his dog. He reports that he has been given SSRI medications in the past which made him feel suicidal --pt was started on scheduled ativan on admission, however, pt does not have current Rx for ativan Plan: --d/c ativan --atarax PRN for anxiety  Severe malnutrition in context of social or environmental  --supplements per dietician    DVT prophylaxis: SCD/Compression stockings Code Status: Full code  Family Communication:  Level of care: Med-Surg Dispo:   The patient is from: home Anticipated d/c is to: home Anticipated d/c date is: 1-2 days Patient currently is not medically ready to d/c due to: pending EGD and colonoscopy    Subjective and Interval History:  Pt reported having a BM this morning with a toilet full of blood, and was  very worried.     Objective: Vitals:   01/09/21 2119 01/09/21 2318 01/10/21 0439 01/10/21 0822  BP: 104/74  94/61 92/68  Pulse: 62  68 70  Resp: 18  16   Temp: 98.1 F (36.7 C)  98.1 F (36.7 C) 98.1 F (36.7 C)  TempSrc: Oral  Oral Oral  SpO2: 100%  98% 97%  Weight:  55.4 kg    Height:  5\' 6"  (1.676 m)      Intake/Output Summary (Last 24 hours) at 01/10/2021 1606 Last data filed at 01/10/2021 1409 Gross per 24 hour  Intake 480 ml  Output 300 ml  Net 180 ml   Filed Weights   01/09/21 1315 01/09/21 2318  Weight: 61.2 kg 55.4 kg    Examination:   Constitutional: NAD, AAOx3 HEENT: conjunctivae and lids normal, EOMI CV: No cyanosis.   RESP: normal respiratory effort, on RA Extremities: No effusions, edema in BLE SKIN: warm, dry Neuro: II - XII grossly intact.   Psych: nervous mood and affect.  Appropriate judgement and reason   Data Reviewed: I have personally reviewed following labs and imaging studies  CBC: Recent Labs  Lab 01/09/21 1314 01/09/21 1850 01/10/21 0152 01/10/21 0951  WBC 12.1*  --   --   --   HGB 13.5 11.6* 12.2* 12.4*  HCT 39.6 33.2* 34.7* 35.9*  MCV 93.8  --   --   --   PLT 267  --   --   --    Basic  Metabolic Panel: Recent Labs  Lab 01/09/21 1314  NA 139  K 3.9  CL 103  CO2 28  GLUCOSE 132*  BUN 14  CREATININE 0.78  CALCIUM 9.5   GFR: Estimated Creatinine Clearance: 75 mL/min (by C-G formula based on SCr of 0.78 mg/dL). Liver Function Tests: Recent Labs  Lab 01/09/21 1314  AST 25  ALT 23  ALKPHOS 100  BILITOT 0.7  PROT 7.5  ALBUMIN 4.6   No results for input(s): LIPASE, AMYLASE in the last 168 hours. No results for input(s): AMMONIA in the last 168 hours. Coagulation Profile: No results for input(s): INR, PROTIME in the last 168 hours. Cardiac Enzymes: No results for input(s): CKTOTAL, CKMB, CKMBINDEX, TROPONINI in the last 168 hours. BNP (last 3 results) No results for input(s): PROBNP in the last 8760  hours. HbA1C: No results for input(s): HGBA1C in the last 72 hours. CBG: No results for input(s): GLUCAP in the last 168 hours. Lipid Profile: No results for input(s): CHOL, HDL, LDLCALC, TRIG, CHOLHDL, LDLDIRECT in the last 72 hours. Thyroid Function Tests: No results for input(s): TSH, T4TOTAL, FREET4, T3FREE, THYROIDAB in the last 72 hours. Anemia Panel: No results for input(s): VITAMINB12, FOLATE, FERRITIN, TIBC, IRON, RETICCTPCT in the last 72 hours. Sepsis Labs: No results for input(s): PROCALCITON, LATICACIDVEN in the last 168 hours.  Recent Results (from the past 240 hour(s))  Resp Panel by RT-PCR (Flu A&B, Covid) Nasopharyngeal Swab     Status: None   Collection Time: 01/09/21  3:11 PM   Specimen: Nasopharyngeal Swab; Nasopharyngeal(NP) swabs in vial transport medium  Result Value Ref Range Status   SARS Coronavirus 2 by RT PCR NEGATIVE NEGATIVE Final    Comment: (NOTE) SARS-CoV-2 target nucleic acids are NOT DETECTED.  The SARS-CoV-2 RNA is generally detectable in upper respiratory specimens during the acute phase of infection. The lowest concentration of SARS-CoV-2 viral copies this assay can detect is 138 copies/mL. A negative result does not preclude SARS-Cov-2 infection and should not be used as the sole basis for treatment or other patient management decisions. A negative result may occur with  improper specimen collection/handling, submission of specimen other than nasopharyngeal swab, presence of viral mutation(s) within the areas targeted by this assay, and inadequate number of viral copies(<138 copies/mL). A negative result must be combined with clinical observations, patient history, and epidemiological information. The expected result is Negative.  Fact Sheet for Patients:  EntrepreneurPulse.com.au  Fact Sheet for Healthcare Providers:  IncredibleEmployment.be  This test is no t yet approved or cleared by the [place] FDA and  has been authorized for detection and/or diagnosis of SARS-CoV-2 by FDA under an Emergency Use Authorization (EUA). This EUA will remain  in effect (meaning this test can be used) for the duration of the COVID-19 declaration under Section 564(b)(1) of the Act, 21 U.S.C.section 360bbb-3(b)(1), unless the authorization is terminated  or revoked sooner.       Influenza A by PCR NEGATIVE NEGATIVE Final   Influenza B by PCR NEGATIVE NEGATIVE Final    Comment: (NOTE) The Xpert Xpress SARS-CoV-2/FLU/RSV plus assay is intended as an aid in the diagnosis of influenza from Nasopharyngeal swab specimens and should not be used as a sole basis for treatment. Nasal washings and aspirates are unacceptable for Xpert Xpress SARS-CoV-2/FLU/RSV testing.  Fact Sheet for Patients: EntrepreneurPulse.com.au  Fact Sheet for Healthcare Providers: IncredibleEmployment.be  This test is not yet approved or cleared by the [place] FDA and has been authorized for detection and/or  diagnosis of SARS-CoV-2 by FDA under an Emergency Use Authorization (EUA). This EUA will remain in effect (meaning this test can be used) for the duration of the COVID-19 declaration under Section 564(b)(1) of the Act, 21 U.S.C. section 360bbb-3(b)(1), unless the authorization is terminated or revoked.  Performed at [facility], [address], [place], [place] [address]       Radiology Studies: No results found.   Scheduled Meds:  feeding supplement  1 Container Oral TID BM   LORazepam  0.5 mg Oral TID   polyethylene glycol-electrolytes  2,000 mL Oral Once   sodium chloride flush  3 mL Intravenous Q12H   Continuous Infusions:  sodium chloride       LOS: 0 days     Enzo Bi, MD [facility] If 7PM-7AM, please contact night-coverage 01/10/2021, 4:06 PM
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
## 2021-01-11 ENCOUNTER — Observation Stay: Payer: Medicaid Other | Admitting: Anesthesiology

## 2021-01-11 ENCOUNTER — Encounter
Admission: EM | Disposition: A | Discharge status: Home / Self Care | Payer: Self-pay | Source: Home / Self Care | Attending: Hospitalist

## 2021-01-11 ENCOUNTER — Encounter: Payer: Self-pay | Admitting: Internal Medicine

## 2021-01-11 DIAGNOSIS — K625 Hemorrhage of anus and rectum: Secondary | ICD-10-CM | POA: Diagnosis not present

## 2021-01-11 HISTORY — PX: COLONOSCOPY WITH PROPOFOL: SHX5780

## 2021-01-11 HISTORY — PX: ESOPHAGOGASTRODUODENOSCOPY (EGD) WITH PROPOFOL: SHX5813

## 2021-01-11 LAB — BASIC METABOLIC PANEL
Anion gap: 6 (ref 5–15)
BUN: 11 mg/dL (ref 8–23)
CO2: 27 mmol/L (ref 22–32)
Calcium: 9 mg/dL (ref 8.9–10.3)
Chloride: 104 mmol/L (ref 98–111)
Creatinine, Ser: 0.59 mg/dL — ABNORMAL LOW (ref 0.61–1.24)
GFR, Estimated: >60 mL/min (ref >60)
Glucose, Bld: 97 mg/dL (ref 70–99)
Potassium: 3.7 mmol/L (ref 3.5–5.1)
Sodium: 137 mmol/L (ref 135–145)

## 2021-01-11 LAB — CBC
HCT: 35.4 % — ABNORMAL LOW (ref 39.0–52.0)
Hemoglobin: 12.5 g/dL — ABNORMAL LOW (ref 13.0–17.0)
MCH: 33.2 pg (ref 26.0–34.0)
MCHC: 35.3 g/dL (ref 30.0–36.0)
MCV: 93.9 fL (ref 80.0–100.0)
Platelets: 222 10*3/uL (ref 150–400)
RBC: 3.77 MIL/uL — ABNORMAL LOW (ref 4.22–5.81)
RDW: 12.3 % (ref 11.5–15.5)
WBC: 9.8 10*3/uL (ref 4.0–10.5)
nRBC: 0 % (ref 0.0–0.2)

## 2021-01-11 LAB — MAGNESIUM: Magnesium: 1.9 mg/dL (ref 1.7–2.4)

## 2021-01-11 LAB — PROTIME-INR
INR: 1.1 (ref 0.8–1.2)
Prothrombin Time: 13.9 seconds (ref 11.4–15.2)

## 2021-01-11 SURGERY — ESOPHAGOGASTRODUODENOSCOPY (EGD) WITH PROPOFOL
Anesthesia: General

## 2021-01-11 MED ORDER — PROPOFOL 500 MG/50ML IV EMUL
INTRAVENOUS | Status: AC
  Filled 2021-01-11: qty 50

## 2021-01-11 MED ORDER — PHENYLEPHRINE HCL (PRESSORS) 10 MG/ML IV SOLN
INTRAVENOUS | Status: DC | PRN
  Administered 2021-01-11 (×2): 80 ug via INTRAVENOUS

## 2021-01-11 MED ORDER — PROPOFOL 10 MG/ML IV BOLUS
INTRAVENOUS | Status: DC | PRN
  Administered 2021-01-11: 60 mg via INTRAVENOUS

## 2021-01-11 MED ORDER — SODIUM CHLORIDE 0.9 % IV SOLN: INTRAVENOUS | Status: DC

## 2021-01-11 MED ORDER — PROPOFOL 500 MG/50ML IV EMUL
INTRAVENOUS | Status: DC | PRN
  Administered 2021-01-11: 150 ug/kg/min via INTRAVENOUS

## 2021-01-11 MED ORDER — LORAZEPAM 0.5 MG PO TABS
0.5000 mg | ORAL_TABLET | Freq: Three times a day (TID) | ORAL | Status: DC | PRN
  Administered 2021-01-11 – 2021-01-13 (×3): 0.5 mg via ORAL
  Filled 2021-01-11 (×3): qty 1

## 2021-01-11 NOTE — Progress Notes (Signed)
PROGRESS NOTE    [name] [name]  EXB:[id number] DOB: November 11, 1960 DOA: 01/09/2021 PCP: [facility]  214A/214A-AA   Assessment & Plan:   Active Problems:   BRBPR (bright red blood per rectum)   Anxiety and depression   Protein-calorie malnutrition, severe   [name] [name] is a 60 y.o. male with medical history significant of no significant past medical history who presents to the emergency department for GI bleeding.  According to the patient over the past 2 months or so he has noted intermittently a small amount of blood at times when he wipes.  He states however morning of presentation he has had 2 large bowel movements of nothing but blood per patient.  States the blood filled the toilet.  Patient denies any abdominal pain denies any nausea or vomiting.  Patient states he has never had a colonoscopy.  Does not take any blood thinners.    BRBPR 2/2 rectal mass --Hgb currently stable ~12. Plan: --EGD today, notable for Erythematous duodenopathy --colonoscopy today, found rectal mass --consult oncology today, Dr. Patrecia Pace to see   Anxiety and depression  - admits to significant anxiety as a long term problem. Also gives history of depression. Both symptoms are worse since his brothers suicide and the loss of his dog. He reports that he has been given SSRI medications in the past which made him feel suicidal --pt was started on scheduled ativan on admission, however, pt does not have current Rx for ativan Plan: --resume ativan PRN, as pt is extremely anxious after the rectal mass finding, and refused Atarax  Severe malnutrition in context of social or environmental  --noted weight loss over the past several months --supplements per dietician    DVT prophylaxis: SCD/Compression stockings Code Status: Full code  Family Communication:  Level of care: Med-Surg Dispo:   The patient is from: home Anticipated d/c is to: home Anticipated d/c date is: 1-2  days  Patient currently is not medically ready to d/c due to: pending oncology consult   Subjective and Interval History:  Colonoscopy today found rectal mass.  Pt was understandably upset and anxious with the result.  Oncology consulted.   Objective: Vitals:   01/11/21 1418 01/11/21 1428 01/11/21 1438 01/11/21 1512  BP: 99/68 114/83 119/82 126/85  Pulse: 61 66 (!) 57 64  Resp: 13 (P) 18 18 18   Temp:    97.8 F (36.6 C)  TempSrc:      SpO2: 100% 99% 99% 100%  Weight:      Height:        Intake/Output Summary (Last 24 hours) at 01/11/2021 1541 Last data filed at 01/11/2021 1357 Gross per 24 hour  Intake 980 ml  Output --  Net 980 ml   Filed Weights   01/09/21 1315 01/09/21 2318  Weight: 61.2 kg 55.4 kg    Examination:   Constitutional: NAD, AAOx3 HEENT: conjunctivae and lids normal, EOMI CV: No cyanosis.   RESP: normal respiratory effort, on RA Neuro: II - XII grossly intact.   Psych: anxious mood and affect.     Data Reviewed: I have personally reviewed following labs and imaging studies  CBC: Recent Labs  Lab 01/09/21 1314 01/09/21 1850 01/10/21 0152 01/10/21 0951 01/11/21 0544  WBC 12.1*  --   --   --  9.8  HGB 13.5 11.6* 12.2* 12.4* 12.5*  HCT 39.6 33.2* 34.7* 35.9* 35.4*  MCV 93.8  --   --   --  93.9  PLT  267  --   --   --  765   Basic Metabolic Panel: Recent Labs  Lab 01/09/21 1314 01/11/21 0544  NA 139 137  K 3.9 3.7  CL 103 104  CO2 28 27  GLUCOSE 132* 97  BUN 14 11  CREATININE 0.78 0.59*  CALCIUM 9.5 9.0  MG  --  1.9   GFR: Estimated Creatinine Clearance: 75 mL/min (A) (by C-G formula based on SCr of 0.59 mg/dL (L)). Liver Function Tests: Recent Labs  Lab 01/09/21 1314  AST 25  ALT 23  ALKPHOS 100  BILITOT 0.7  PROT 7.5  ALBUMIN 4.6   No results for input(s): LIPASE, AMYLASE in the last 168 hours. No results for input(s): AMMONIA in the last 168 hours. Coagulation Profile: Recent Labs  Lab 01/11/21 0544  INR 1.1    Cardiac Enzymes: No results for input(s): CKTOTAL, CKMB, CKMBINDEX, TROPONINI in the last 168 hours. BNP (last 3 results) No results for input(s): PROBNP in the last 8760 hours. HbA1C: Recent Labs    01/10/21 0152  HGBA1C 5.1   CBG: No results for input(s): GLUCAP in the last 168 hours. Lipid Profile: No results for input(s): CHOL, HDL, LDLCALC, TRIG, CHOLHDL, LDLDIRECT in the last 72 hours. Thyroid Function Tests: No results for input(s): TSH, T4TOTAL, FREET4, T3FREE, THYROIDAB in the last 72 hours. Anemia Panel: No results for input(s): VITAMINB12, FOLATE, FERRITIN, TIBC, IRON, RETICCTPCT in the last 72 hours. Sepsis Labs: No results for input(s): PROCALCITON, LATICACIDVEN in the last 168 hours.  Recent Results (from the past 240 hour(s))  Resp Panel by RT-PCR (Flu A&B, Covid) Nasopharyngeal Swab     Status: None   Collection Time: 01/09/21  3:11 PM   Specimen: Nasopharyngeal Swab; Nasopharyngeal(NP) swabs in vial transport medium  Result Value Ref Range Status   SARS Coronavirus 2 by RT PCR NEGATIVE NEGATIVE Final    Comment: (NOTE) SARS-CoV-2 target nucleic acids are NOT DETECTED.  The SARS-CoV-2 RNA is generally detectable in upper respiratory specimens during the acute phase of infection. The lowest concentration of SARS-CoV-2 viral copies this assay can detect is 138 copies/mL. A negative result does not preclude SARS-Cov-2 infection and should not be used as the sole basis for treatment or other patient management decisions. A negative result may occur with  improper specimen collection/handling, submission of specimen other than nasopharyngeal swab, presence of viral mutation(s) within the areas targeted by this assay, and inadequate number of viral copies(<138 copies/mL). A negative result must be combined with clinical observations, patient history, and epidemiological information. The expected result is Negative.  Fact Sheet for Patients:   EntrepreneurPulse.com.au  Fact Sheet for Healthcare Providers:  IncredibleEmployment.be  This test is no t yet approved or cleared by the [place] FDA and  has been authorized for detection and/or diagnosis of SARS-CoV-2 by FDA under an Emergency Use Authorization (EUA). This EUA will remain  in effect (meaning this test can be used) for the duration of the COVID-19 declaration under Section 564(b)(1) of the Act, 21 U.S.C.section 360bbb-3(b)(1), unless the authorization is terminated  or revoked sooner.       Influenza A by PCR NEGATIVE NEGATIVE Final   Influenza B by PCR NEGATIVE NEGATIVE Final    Comment: (NOTE) The Xpert Xpress SARS-CoV-2/FLU/RSV plus assay is intended as an aid in the diagnosis of influenza from Nasopharyngeal swab specimens and should not be used as a sole basis for treatment. Nasal washings and aspirates are unacceptable for Xpert Xpress SARS-CoV-2/FLU/RSV testing.  Fact Sheet for Patients: EntrepreneurPulse.com.au  Fact Sheet for Healthcare Providers: IncredibleEmployment.be  This test is not yet approved or cleared by the [place] FDA and has been authorized for detection and/or diagnosis of SARS-CoV-2 by FDA under an Emergency Use Authorization (EUA). This EUA will remain in effect (meaning this test can be used) for the duration of the COVID-19 declaration under Section 564(b)(1) of the Act, 21 U.S.C. section 360bbb-3(b)(1), unless the authorization is terminated or revoked.  Performed at [facility], [address], [place], [place] [address]       Radiology Studies: No results found.   Scheduled Meds:  feeding supplement  1 Container Oral TID BM   sodium chloride flush  3 mL Intravenous Q12H   Continuous Infusions:  sodium chloride       LOS: 0 days     Enzo Bi, MD [facility] If 7PM-7AM, please contact  night-coverage 01/11/2021, 3:41 PM

## 2021-01-11 NOTE — Op Note (Signed)
[facility] Gastroenterology Patient Name: [name] [name] Procedure Date: 01/11/2021 1:09 PM MRN: [id number] Account #: [id number] Date of Birth: 1960/11/26 Admit Type: Outpatient Age: 60 Room: [facility] ENDO ROOM 1 Gender: Male Note Status: Finalized Instrument Name: Colonoscope [id number] Procedure:             Colonoscopy Indications:           Hematochezia, Weight loss Providers:             Annamaria Helling DO, DO Medicines:             Monitored Anesthesia Care Complications:         No immediate complications. Estimated blood loss:                         Minimal. Procedure:             Pre-Anesthesia Assessment:                        - Prior to the procedure, a History and Physical was                         performed, and patient medications and allergies were                         reviewed. The patient is competent. The risks and                         benefits of the procedure and the sedation options and                         risks were discussed with the patient. All questions                         were answered and informed consent was obtained.                         Patient identification and proposed procedure were                         verified by the physician, the nurse, the anesthetist                         and the technician in the endoscopy suite. Mental                         Status Examination: alert and oriented. Airway                         Examination: normal oropharyngeal airway and neck                         mobility. Respiratory Examination: clear to                         auscultation. CV Examination: RRR, no murmurs, no S3                         or S4. Prophylactic Antibiotics: The patient does not  require prophylactic antibiotics. Prior                         Anticoagulants: The patient has taken no previous                         anticoagulant or antiplatelet agents. ASA Grade                          Assessment: III - A patient with severe systemic                         disease. After reviewing the risks and benefits, the                         patient was deemed in satisfactory condition to                         undergo the procedure. The anesthesia plan was to use                         monitored anesthesia care (MAC). Immediately prior to                         administration of medications, the patient was                         re-assessed for adequacy to receive sedatives. The                         heart rate, respiratory rate, oxygen saturations,                         blood pressure, adequacy of pulmonary ventilation, and                         response to care were monitored throughout the                         procedure. The physical status of the patient was                         re-assessed after the procedure.                        After obtaining informed consent, the colonoscope was                         passed under direct vision. Throughout the procedure,                         the patient's blood pressure, pulse, and oxygen                         saturations were monitored continuously. The                         Colonoscope was introduced through the anus and  advanced to the the terminal ileum, with                         identification of the appendiceal orifice and IC                         valve. The colonoscopy was performed without                         difficulty. The patient tolerated the procedure well.                         The terminal ileum, ileocecal valve, appendiceal                         orifice, and rectum were photographed. The quality of                         the bowel preparation was evaluated using the BBPS                         [facility] Bowel Preparation Scale) with scores of: Right                         Colon = 2 (minor amount of residual staining, small                          fragments of stool and/or opaque liquid, but mucosa                         seen well), Transverse Colon = 3 (entire mucosa seen                         well with no residual staining, small fragments of                         stool or opaque liquid) and Left Colon = 3 (entire                         mucosa seen well with no residual staining, small                         fragments of stool or opaque liquid). The total BBPS                         score equals 8. The quality of the bowel preparation                         was excellent. Findings:      The digital rectal exam findings include palpable rectal mass. Pertinent       negatives include normal sphincter tone.      The terminal ileum appeared normal.      A 15 mm polyp was found at 30 cm proximal to the anus. The polyp was       sessile. The polyp was removed with a piecemeal technique using a cold       snare. Resection and retrieval  were complete. Estimated blood loss was       minimal.      An ulcerated partially obstructing large mass was found in the rectum.       The mass was almost completely circumferential. The mass measured ten cm       in length. Oozing was present. Biopsies were taken with a cold forceps       for histology. Estimated blood loss was minimal.      Retroflexion not performed given rectal mass      The exam was otherwise without abnormality on direct and retroflexion       views. Impression:            - Palpable rectal mass found on digital rectal exam.                        - The examined portion of the ileum was normal.                        - One 15 mm polyp at 30 cm proximal to the anus,                         removed piecemeal using a cold snare. Resected and                         retrieved.                        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 Biopsied.                        - The examination was otherwise normal on direct and                          retroflexion views. Recommendation:        - Return patient to hospital ward for ongoing care.                        - Resume regular diet.                        - Continue present medications.                        - Await pathology results.                        - Repeat colonoscopy in 6 months for piecemeal                         polypectomy, or after mass removal.                        - Refer to a colo-rectal surgeon at the next available                         appointment.                        -  Refer to an oncologist at the next available                         appointment. Procedure Code(s):     --- Professional ---                        [phone], Colonoscopy, flexible; with removal of                         tumor(s), polyp(s), or other lesion(s) by snare                         technique                        45380, 98, Colonoscopy, flexible; with biopsy, single                         or multiple Diagnosis Code(s):     --- Professional ---                        K62.89, Other specified diseases of anus and rectum                        K63.5, Polyp of colon                        D49.0, Neoplasm of unspecified behavior of digestive                         system                        K56.690, Other partial intestinal obstruction                        K92.1, Melena (includes Hematochezia)                        R63.4, Abnormal weight loss CPT copyright 2019 American Medical Association. All rights reserved. The codes documented in this report are preliminary and upon coder review may  be revised to meet current compliance requirements. Attending Participation:      I personally performed the entire procedure. Volney American, DO Annamaria Helling DO, DO 01/11/2021 2:20:23 PM This report has been signed electronically. Number of Addenda: 0 Note Initiated On: 01/11/2021 1:09 PM Scope Withdrawal Time: 0 hours 19 minutes 23 seconds  Total  Procedure Duration: 0 hours 26 minutes 44 seconds  Estimated Blood Loss:  Estimated blood loss was minimal.      [facility]

## 2021-01-11 NOTE — Anesthesia Postprocedure Evaluation (Signed)
Anesthesia Post Note  Patient: [name] [name]  Procedure(s) Performed: ESOPHAGOGASTRODUODENOSCOPY (EGD) WITH PROPOFOL COLONOSCOPY WITH PROPOFOL  Patient location during evaluation: Phase II Anesthesia Type: General Level of consciousness: awake and alert, awake and oriented Pain management: pain level controlled Vital Signs Assessment: post-procedure vital signs reviewed and stable Respiratory status: spontaneous breathing, nonlabored ventilation and respiratory function stable Cardiovascular status: blood pressure returned to baseline and stable Postop Assessment: no apparent nausea or vomiting Anesthetic complications: no   No notable events documented.   Last Vitals:  Vitals:   01/11/21 1428 01/11/21 1438  BP: 114/83 119/82  Pulse: 66 (!) 57  Resp: (P) 18 18  Temp:    SpO2: 99% 99%    Last Pain:  Vitals:   01/11/21 1438  TempSrc:   PainSc: 0-No pain                 Phill Mutter

## 2021-01-11 NOTE — Transfer of Care (Signed)
Immediate Anesthesia Transfer of Care Note  Patient: [name] [name]  Procedure(s) Performed: ESOPHAGOGASTRODUODENOSCOPY (EGD) WITH PROPOFOL COLONOSCOPY WITH PROPOFOL  Patient Location: PACU  Anesthesia Type:General  Level of Consciousness: awake, alert  and oriented  Airway & Oxygen Therapy: Patient Spontanous Breathing and Patient connected to nasal cannula oxygen  Post-op Assessment: Report given to RN and Post -op Vital signs reviewed and stable  Post vital signs: Reviewed and stable  Last Vitals:  Vitals Value Taken Time  BP 90/61 01/11/21 1408  Temp    Pulse 63 01/11/21 1410  Resp 20 01/11/21 1410  SpO2 99 % 01/11/21 1410  Vitals shown include unvalidated device data.  Last Pain:  Vitals:   01/11/21 1306  TempSrc: Temporal  PainSc: 0-No pain         Complications: No notable events documented.

## 2021-01-11 NOTE — Op Note (Signed)
[facility] Gastroenterology Patient Name: [name] [name] Procedure Date: 01/11/2021 1:09 PM MRN: [id number] Account #: [id number] Date of Birth: 05/10/1960 Admit Type: Outpatient Age: 60 Room: [facility] ENDO ROOM 1 Gender: Male Note Status: Finalized Instrument Name: Upper Endoscope [id number] Procedure:             Upper GI endoscopy Indications:           Iron deficiency anemia secondary to chronic blood                         loss, Hematochezia, Weight loss Providers:             Annamaria Helling DO, DO Medicines:             Monitored Anesthesia Care Complications:         No immediate complications. Estimated blood loss:                         Minimal. Procedure:             Pre-Anesthesia Assessment:                        - Prior to the procedure, a History and Physical was                         performed, and patient medications and allergies were                         reviewed. The patient is competent. The risks and                         benefits of the procedure and the sedation options and                         risks were discussed with the patient. All questions                         were answered and informed consent was obtained.                         Patient identification and proposed procedure were                         verified by the physician, the nurse, the anesthetist                         and the technician in the endoscopy suite. Mental                         Status Examination: alert and oriented. Airway                         Examination: normal oropharyngeal airway and neck                         mobility. Respiratory Examination: clear to                         auscultation. CV Examination: RRR, no murmurs, no  S3                         or S4. Prophylactic Antibiotics: The patient does not                         require prophylactic antibiotics. Prior                         Anticoagulants: The patient has taken no  previous                         anticoagulant or antiplatelet agents. ASA Grade                         Assessment: III - A patient with severe systemic                         disease. After reviewing the risks and benefits, the                         patient was deemed in satisfactory condition to                         undergo the procedure. The anesthesia plan was to use                         monitored anesthesia care (MAC). Immediately prior to                         administration of medications, the patient was                         re-assessed for adequacy to receive sedatives. The                         heart rate, respiratory rate, oxygen saturations,                         blood pressure, adequacy of pulmonary ventilation, and                         response to care were monitored throughout the                         procedure. The physical status of the patient was                         re-assessed after the procedure.                        After obtaining informed consent, the endoscope was                         passed under direct vision. Throughout the procedure,                         the patient's blood pressure, pulse, and oxygen  saturations were monitored continuously. The Endoscope                         was introduced through the mouth, and advanced to the                         second part of duodenum. The upper GI endoscopy was                         accomplished without difficulty. The patient tolerated                         the procedure well. Findings:      Localized moderately erythematous mucosa without active bleeding and       with no stigmata of bleeding was found in the duodenal bulb. Biopsies       for histology were taken with a cold forceps for evaluation of celiac       disease. Estimated blood loss was minimal.      Normal mucosa was found in the entire examined stomach. Biopsies were       taken with a  cold forceps for Helicobacter pylori testing. Estimated       blood loss was minimal.      The Z-line was regular and was found 40 cm from the incisors.      Esophagogastric landmarks were identified: the Z-line was found at 40 cm       from the incisors.      The exam of the esophagus was otherwise normal. Impression:            - Erythematous duodenopathy. Biopsied.                        - Normal mucosa was found in the entire stomach.                         Biopsied.                        - Z-line regular, 40 cm from the incisors.                        - Esophagogastric landmarks identified. Recommendation:        - Return patient to hospital ward for ongoing care.                        - Resume previous diet.                        - Continue present medications.                        - Await pathology results.                        - The findings and recommendations were discussed with                         the patient's primary physician. Procedure Code(s):     --- Professional ---  16109, Esophagogastroduodenoscopy, flexible,                         transoral; with biopsy, single or multiple Diagnosis Code(s):     --- Professional ---                        K31.89, Other diseases of stomach and duodenum                        D50.0, Iron deficiency anemia secondary to blood loss                         (chronic)                        K92.1, Melena (includes Hematochezia)                        R63.4, Abnormal weight loss CPT copyright 2019 American Medical Association. All rights reserved. The codes documented in this report are preliminary and upon coder review may  be revised to meet current compliance requirements. Attending Participation:      I personally performed the entire procedure. Volney American, DO Annamaria Helling DO, DO 01/11/2021 2:11:16 PM This report has been signed electronically. Number of Addenda: 0 Note Initiated On:  01/11/2021 1:09 PM Estimated Blood Loss:  Estimated blood loss was minimal.      [facility]
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
## 2021-01-11 NOTE — Interval H&P Note (Signed)
History and Physical Interval Note: Progress note and consult note from 01/11/21 and 01/10/21 respectively  were reviewed and there was no interval change after seeing and examining the patient.  Written consent was obtained from the patient after discussion of risks, benefits, and alternatives. Patient has consented to proceed with Esophagogastroduodenoscopy and Colonoscopy with possible intervention   01/11/2021 11:24 AM  [name] [name]  has presented today for surgery, with the diagnosis of hematochezia.  The various methods of treatment have been discussed with the patient and family. After consideration of risks, benefits and other options for treatment, the patient has consented to  Procedure(s): ESOPHAGOGASTRODUODENOSCOPY (EGD) WITH PROPOFOL (N/A) COLONOSCOPY WITH PROPOFOL (N/A) as a surgical intervention.  The patient's history has been reviewed, patient examined, no change in status, stable for surgery.  I have reviewed the patient's chart and labs.  Questions were answered to the patient's satisfaction.     Annamaria Helling

## 2021-01-11 NOTE — H&P (View-Only) (Signed)
[facility] Inpatient Follow-up/Progress Note   Patient ID: [name] [name] is a 60 y.o. male.  Overnight Events / Subjective Findings Patient completed his colonoscopy prep overnight in anticipation of his procedures today.  He notes abdominal cramping post prep.  He did not notice any further bleeding.  His hemoglobin has remained stable to improved.  No nausea or vomiting.  He has been n.p.o. since midnight.  No other acute GI complaints  Review of Systems  Constitutional:  Positive for unexpected weight change. Negative for activity change, appetite change, chills, fatigue and fever.  HENT:  Negative for trouble swallowing and voice change.   Respiratory:  Negative for shortness of breath.   Cardiovascular:  Negative for chest pain and palpitations.  Gastrointestinal:  Positive for abdominal pain (cramping). Negative for abdominal distention, anal bleeding, blood in stool, constipation, diarrhea, nausea and vomiting.  Musculoskeletal:  Negative for arthralgias and myalgias.  Skin:  Negative for color change and pallor.  Neurological:  Negative for dizziness, syncope and weakness.  Psychiatric/Behavioral:  Negative for confusion. The patient is not nervous/anxious.   All other systems reviewed and are negative.   Medications  Current Facility-Administered Medications:    0.9 %  sodium chloride infusion, 250 mL, Intravenous, PRN, Norins, Heinz Knuckles, MD   acetaminophen (TYLENOL) tablet 650 mg, 650 mg, Oral, Q6H PRN **OR** acetaminophen (TYLENOL) suppository 650 mg, 650 mg, Rectal, Q6H PRN, Norins, Heinz Knuckles, MD   feeding supplement (BOOST / RESOURCE BREEZE) liquid 1 Container, 1 Container, Oral, TID BM, Enzo Bi, MD   hydrOXYzine (ATARAX/VISTARIL) tablet 25 mg, 25 mg, Oral, TID PRN, Enzo Bi, MD   sodium chloride flush (NS) 0.9 % injection 3 mL, 3 mL, Intravenous, Q12H, Norins, Heinz Knuckles, MD, 3 mL at 01/11/21 0048   sodium chloride flush (NS) 0.9 % injection 3 mL, 3 mL,  Intravenous, PRN, Norins, Heinz Knuckles, MD  sodium chloride      sodium chloride, acetaminophen **OR** acetaminophen, hydrOXYzine, sodium chloride flush   Objective    Vitals:   01/10/21 0439 01/10/21 0822 01/10/21 2039 01/11/21 0412  BP: 94/61 92/68 100/69 95/69  Pulse: 68 70 62 68  Resp: 16  16 16   Temp: 98.1 F (36.7 C) 98.1 F (36.7 C) 98.4 F (36.9 C) 97.8 F (36.6 C)  TempSrc: Oral Oral Oral Oral  SpO2: 98% 97% 97% 96%  Weight:      Height:         Physical Exam Vitals and nursing note reviewed.  Constitutional:      General: He is not in acute distress.    Appearance: Normal appearance. He is ill-appearing (chronically). He is not toxic-appearing or diaphoretic.     Comments: Thin and frail appearing  HENT:     Head: Normocephalic and atraumatic.     Nose: Nose normal.     Mouth/Throat:     Mouth: Mucous membranes are moist.     Pharynx: Oropharynx is clear.  Eyes:     General: No scleral icterus.    Extraocular Movements: Extraocular movements intact.  Cardiovascular:     Rate and Rhythm: Normal rate and regular rhythm.     Heart sounds: Normal heart sounds. No murmur heard.   No friction rub. No gallop.  Pulmonary:     Effort: Pulmonary effort is normal. No respiratory distress.     Breath sounds: Normal breath sounds. No wheezing, rhonchi or rales.  Abdominal:     General: Abdomen is flat. Bowel sounds are normal.  There is no distension.     Palpations: Abdomen is soft.     Tenderness: There is no abdominal tenderness. There is no guarding or rebound.  Musculoskeletal:     Cervical back: Neck supple.     Right lower leg: No edema.     Left lower leg: No edema.  Skin:    General: Skin is warm and dry.     Coloration: Skin is not jaundiced or pale.  Neurological:     General: No focal deficit present.     Mental Status: He is alert and oriented to person, place, and time. Mental status is at baseline.  Psychiatric:        Mood and Affect: Mood  normal.        Behavior: Behavior normal.        Thought Content: Thought content normal.        Judgment: Judgment normal.     Laboratory Data Recent Labs  Lab 01/09/21 1314 01/09/21 1850 01/10/21 0152 01/10/21 0951 01/11/21 0544  WBC 12.1*  --   --   --  9.8  HGB 13.5   < > 12.2* 12.4* 12.5*  HCT 39.6   < > 34.7* 35.9* 35.4*  PLT 267  --   --   --  222   < > = values in this interval not displayed.   Recent Labs  Lab 01/09/21 1314 01/11/21 0544  NA 139 137  K 3.9 3.7  CL 103 104  CO2 28 27  BUN 14 11  CREATININE 0.78 0.59*  CALCIUM 9.5 9.0  PROT 7.5  --   BILITOT 0.7  --   ALKPHOS 100  --   ALT 23  --   AST 25  --   GLUCOSE 132* 97   Recent Labs  Lab 01/11/21 0544  INR 1.1      Imaging Studies: No results found.  Assessment:   # Hematochezia/ Lower GI Bleed -Suspect diverticular bleed given painless nature and now slowing down.  Differential includes hemorrhoidal, polypoid, malignancy or AVM -Hemoglobin is remained relatively stable during hospitalization.  Vital signs of been stable throughout his hospitalization as well -Colonoscopy nave.  Has never undergone upper endoscopy other # Abnormal weight loss -20 pounds in 3 months # [facility]- July 2022 # HCV s/p treatment   Plan:  Completed preparation overnight with transparent stools No further signs of bleeding.  Hemoglobin has been stable. Plan for EGD and colonoscopy today N.p.o. status until procedure -Monitor H&H; transfusion and resuscitation as per primary team -Morning labs reviewed -DVT prophylaxis -Outpatient liver disease follow-up  Esophagogastroduodenoscopy and colonoscopy with possible biopsy, control of bleeding, polypectomy, and interventions as necessary has been discussed with the patient/patient representative. Informed consent was obtained from the patient/patient representative after explaining the indication, nature, and risks of the procedure including but not limited to  death, bleeding, perforation, missed neoplasm/lesions, cardiorespiratory compromise, and reaction to medications. Opportunity for questions was given and appropriate answers were provided. Patient/patient representative has verbalized understanding is amenable to undergoing the procedure.   I personally performed the service.  Management of other medical comorbidities as per primary team  Thank you for allowing [place] to participate in this patient's care. Please don't hesitate to call if any questions or concerns arise.   Annamaria Helling, DO [facility]  Portions of the record may have been created with voice recognition software. Occasional wrong-word or 'sound-a-like' substitutions may have occurred due to the inherent limitations of voice  recognition software.  Read the chart carefully and recognize, using context, where substitutions may have occurred.

## 2021-01-11 NOTE — Progress Notes (Signed)
[facility] Inpatient Follow-up/Progress Note   Patient ID: [name] [name] is a 60 y.o. male.  Overnight Events / Subjective Findings Patient completed his colonoscopy prep overnight in anticipation of his procedures today.  He notes abdominal cramping post prep.  He did not notice any further bleeding.  His hemoglobin has remained stable to improved.  No nausea or vomiting.  He has been n.p.o. since midnight.  No other acute GI complaints  Review of Systems  Constitutional:  Positive for unexpected weight change. Negative for activity change, appetite change, chills, fatigue and fever.  HENT:  Negative for trouble swallowing and voice change.   Respiratory:  Negative for shortness of breath.   Cardiovascular:  Negative for chest pain and palpitations.  Gastrointestinal:  Positive for abdominal pain (cramping). Negative for abdominal distention, anal bleeding, blood in stool, constipation, diarrhea, nausea and vomiting.  Musculoskeletal:  Negative for arthralgias and myalgias.  Skin:  Negative for color change and pallor.  Neurological:  Negative for dizziness, syncope and weakness.  Psychiatric/Behavioral:  Negative for confusion. The patient is not nervous/anxious.   All other systems reviewed and are negative.   Medications  Current Facility-Administered Medications:    0.9 %  sodium chloride infusion, 250 mL, Intravenous, PRN, Norins, Heinz Knuckles, MD   acetaminophen (TYLENOL) tablet 650 mg, 650 mg, Oral, Q6H PRN **OR** acetaminophen (TYLENOL) suppository 650 mg, 650 mg, Rectal, Q6H PRN, Norins, Heinz Knuckles, MD   feeding supplement (BOOST / RESOURCE BREEZE) liquid 1 Container, 1 Container, Oral, TID BM, Enzo Bi, MD   hydrOXYzine (ATARAX/VISTARIL) tablet 25 mg, 25 mg, Oral, TID PRN, Enzo Bi, MD   sodium chloride flush (NS) 0.9 % injection 3 mL, 3 mL, Intravenous, Q12H, Norins, Heinz Knuckles, MD, 3 mL at 01/11/21 0048   sodium chloride flush (NS) 0.9 % injection 3 mL, 3 mL,  Intravenous, PRN, Norins, Heinz Knuckles, MD  sodium chloride      sodium chloride, acetaminophen **OR** acetaminophen, hydrOXYzine, sodium chloride flush   Objective    Vitals:   01/10/21 0439 01/10/21 0822 01/10/21 2039 01/11/21 0412  BP: 94/61 92/68 100/69 95/69  Pulse: 68 70 62 68  Resp: 16  16 16   Temp: 98.1 F (36.7 C) 98.1 F (36.7 C) 98.4 F (36.9 C) 97.8 F (36.6 C)  TempSrc: Oral Oral Oral Oral  SpO2: 98% 97% 97% 96%  Weight:      Height:         Physical Exam Vitals and nursing note reviewed.  Constitutional:      General: He is not in acute distress.    Appearance: Normal appearance. He is ill-appearing (chronically). He is not toxic-appearing or diaphoretic.     Comments: Thin and frail appearing  HENT:     Head: Normocephalic and atraumatic.     Nose: Nose normal.     Mouth/Throat:     Mouth: Mucous membranes are moist.     Pharynx: Oropharynx is clear.  Eyes:     General: No scleral icterus.    Extraocular Movements: Extraocular movements intact.  Cardiovascular:     Rate and Rhythm: Normal rate and regular rhythm.     Heart sounds: Normal heart sounds. No murmur heard.   No friction rub. No gallop.  Pulmonary:     Effort: Pulmonary effort is normal. No respiratory distress.     Breath sounds: Normal breath sounds. No wheezing, rhonchi or rales.  Abdominal:     General: Abdomen is flat. Bowel sounds are normal.  There is no distension.     Palpations: Abdomen is soft.     Tenderness: There is no abdominal tenderness. There is no guarding or rebound.  Musculoskeletal:     Cervical back: Neck supple.     Right lower leg: No edema.     Left lower leg: No edema.  Skin:    General: Skin is warm and dry.     Coloration: Skin is not jaundiced or pale.  Neurological:     General: No focal deficit present.     Mental Status: He is alert and oriented to person, place, and time. Mental status is at baseline.  Psychiatric:        Mood and Affect: Mood  normal.        Behavior: Behavior normal.        Thought Content: Thought content normal.        Judgment: Judgment normal.     Laboratory Data Recent Labs  Lab 01/09/21 1314 01/09/21 1850 01/10/21 0152 01/10/21 0951 01/11/21 0544  WBC 12.1*  --   --   --  9.8  HGB 13.5   < > 12.2* 12.4* 12.5*  HCT 39.6   < > 34.7* 35.9* 35.4*  PLT 267  --   --   --  222   < > = values in this interval not displayed.   Recent Labs  Lab 01/09/21 1314 01/11/21 0544  NA 139 137  K 3.9 3.7  CL 103 104  CO2 28 27  BUN 14 11  CREATININE 0.78 0.59*  CALCIUM 9.5 9.0  PROT 7.5  --   BILITOT 0.7  --   ALKPHOS 100  --   ALT 23  --   AST 25  --   GLUCOSE 132* 97   Recent Labs  Lab 01/11/21 0544  INR 1.1      Imaging Studies: No results found.  Assessment:   # Hematochezia/ Lower GI Bleed -Suspect diverticular bleed given painless nature and now slowing down.  Differential includes hemorrhoidal, polypoid, malignancy or AVM -Hemoglobin is remained relatively stable during hospitalization.  Vital signs of been stable throughout his hospitalization as well -Colonoscopy nave.  Has never undergone upper endoscopy other # Abnormal weight loss -20 pounds in 3 months # [facility]- July 2022 # HCV s/p treatment   Plan:  Completed preparation overnight with transparent stools No further signs of bleeding.  Hemoglobin has been stable. Plan for EGD and colonoscopy today N.p.o. status until procedure -Monitor H&H; transfusion and resuscitation as per primary team -Morning labs reviewed -DVT prophylaxis -Outpatient liver disease follow-up  Esophagogastroduodenoscopy and colonoscopy with possible biopsy, control of bleeding, polypectomy, and interventions as necessary has been discussed with the patient/patient representative. Informed consent was obtained from the patient/patient representative after explaining the indication, nature, and risks of the procedure including but not limited to  death, bleeding, perforation, missed neoplasm/lesions, cardiorespiratory compromise, and reaction to medications. Opportunity for questions was given and appropriate answers were provided. Patient/patient representative has verbalized understanding is amenable to undergoing the procedure.   I personally performed the service.  Management of other medical comorbidities as per primary team  Thank you for allowing [place] to participate in this patient's care. Please don't hesitate to call if any questions or concerns arise.   Annamaria Helling, DO [facility]  Portions of the record may have been created with voice recognition software. Occasional wrong-word or 'sound-a-like' substitutions may have occurred due to the inherent limitations of voice  recognition software.  Read the chart carefully and recognize, using context, where substitutions may have occurred.

## 2021-01-11 NOTE — Progress Notes (Addendum)
Pt underwent EGD and Colonoscopy today. Colonoscopy revealing almost circumferential, non-obstructing rectal mass. This was palpated on DRE as well.  These findings were communicated to the patient and the hospitalist team. Plan for Oncology and Colorectal surgery consults. Pt asked that these findings be communicated with his son, Dorothea Ogle, however- the phone number is on his cell phone which is not currently with him in endoscopy. Will go by his room the retrieve number to call Dorothea Ogle. Stat order placed on rectal mass pathology. No further GI interventions at this point in time.  GI to sign off. Available as needed. Please do not hesitate to call regarding questions or concerns.  Annamaria Helling, DO [facility]    Addendum- Have attempted to call Dorothea Ogle twice (at [phone]) as of 15:18 today. Unfortunately, there is no answer and the voice mailbox is full and unable to leave callback number. Will try again at later time

## 2021-01-12 ENCOUNTER — Observation Stay: Payer: Medicaid Other

## 2021-01-12 ENCOUNTER — Encounter: Payer: Self-pay | Admitting: Internal Medicine

## 2021-01-12 DIAGNOSIS — K6289 Other specified diseases of anus and rectum: Secondary | ICD-10-CM | POA: Diagnosis not present

## 2021-01-12 DIAGNOSIS — Z818 Family history of other mental and behavioral disorders: Secondary | ICD-10-CM | POA: Diagnosis not present

## 2021-01-12 DIAGNOSIS — E43 Unspecified severe protein-calorie malnutrition: Secondary | ICD-10-CM | POA: Diagnosis present

## 2021-01-12 DIAGNOSIS — D500 Iron deficiency anemia secondary to blood loss (chronic): Secondary | ICD-10-CM | POA: Diagnosis present

## 2021-01-12 DIAGNOSIS — K625 Hemorrhage of anus and rectum: Secondary | ICD-10-CM

## 2021-01-12 DIAGNOSIS — Z87891 Personal history of nicotine dependence: Secondary | ICD-10-CM | POA: Diagnosis not present

## 2021-01-12 DIAGNOSIS — C20 Malignant neoplasm of rectum: Principal | ICD-10-CM

## 2021-01-12 DIAGNOSIS — K921 Melena: Secondary | ICD-10-CM | POA: Diagnosis present

## 2021-01-12 DIAGNOSIS — F419 Anxiety disorder, unspecified: Secondary | ICD-10-CM | POA: Diagnosis present

## 2021-01-12 DIAGNOSIS — K3189 Other diseases of stomach and duodenum: Secondary | ICD-10-CM | POA: Diagnosis present

## 2021-01-12 DIAGNOSIS — C7802 Secondary malignant neoplasm of left lung: Secondary | ICD-10-CM | POA: Diagnosis present

## 2021-01-12 DIAGNOSIS — F32A Depression, unspecified: Secondary | ICD-10-CM | POA: Diagnosis present

## 2021-01-12 DIAGNOSIS — Z20822 Contact with and (suspected) exposure to covid-19: Secondary | ICD-10-CM | POA: Diagnosis present

## 2021-01-12 DIAGNOSIS — Z681 Body mass index (BMI) 19 or less, adult: Secondary | ICD-10-CM | POA: Diagnosis not present

## 2021-01-12 LAB — CBC
HCT: 32.7 % — ABNORMAL LOW (ref 39.0–52.0)
Hemoglobin: 11.3 g/dL — ABNORMAL LOW (ref 13.0–17.0)
MCH: 32.4 pg (ref 26.0–34.0)
MCHC: 34.6 g/dL (ref 30.0–36.0)
MCV: 93.7 fL (ref 80.0–100.0)
Platelets: 242 10*3/uL (ref 150–400)
RBC: 3.49 MIL/uL — ABNORMAL LOW (ref 4.22–5.81)
RDW: 12.5 % (ref 11.5–15.5)
WBC: 9.8 10*3/uL (ref 4.0–10.5)
nRBC: 0 % (ref 0.0–0.2)

## 2021-01-12 LAB — MAGNESIUM: Magnesium: 1.7 mg/dL (ref 1.7–2.4)

## 2021-01-12 LAB — BASIC METABOLIC PANEL
Anion gap: 6 (ref 5–15)
BUN: 13 mg/dL (ref 8–23)
CO2: 25 mmol/L (ref 22–32)
Calcium: 8.7 mg/dL — ABNORMAL LOW (ref 8.9–10.3)
Chloride: 107 mmol/L (ref 98–111)
Creatinine, Ser: 0.59 mg/dL — ABNORMAL LOW (ref 0.61–1.24)
GFR, Estimated: >60 mL/min (ref >60)
Glucose, Bld: 90 mg/dL (ref 70–99)
Potassium: 3.4 mmol/L — ABNORMAL LOW (ref 3.5–5.1)
Sodium: 138 mmol/L (ref 135–145)

## 2021-01-12 LAB — SURGICAL PATHOLOGY

## 2021-01-12 IMAGING — CT CT CHEST-ABD-PELV W/ CM
2 of 5 series · 13 of 36 positions shown, 15 images · IV contrast (omnipaque)
Comparison: None.

CLINICAL DATA: Colorectal cancer staging, new diagnosis rectal mass

EXAM:
CT CHEST, ABDOMEN, AND PELVIS WITH CONTRAST
TECHNIQUE: Multidetector CT imaging of the chest, abdomen and pelvis was
performed following the standard protocol during bolus
administration of intravenous contrast.
CONTRAST:  100mL OMNIPAQUE IOHEXOL 300 MG/ML SOLN, additional oral
enteric contrast

[Series 2: cap with · axial · 0.64mm/px · z∈[-50,+490]mm · 10 of 132 slices shown, 12 images]
[im 12/132  mediastinal]
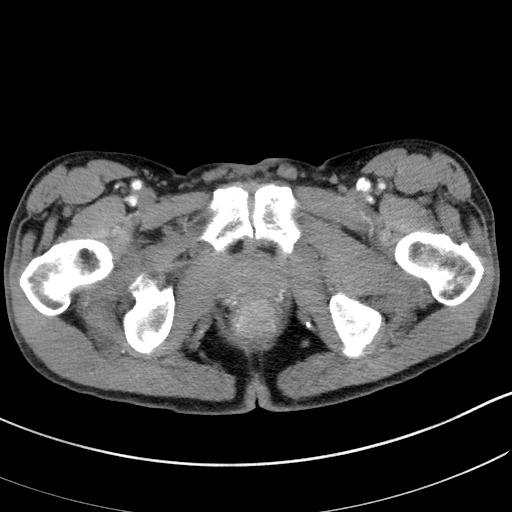
[im 12/132  bone]
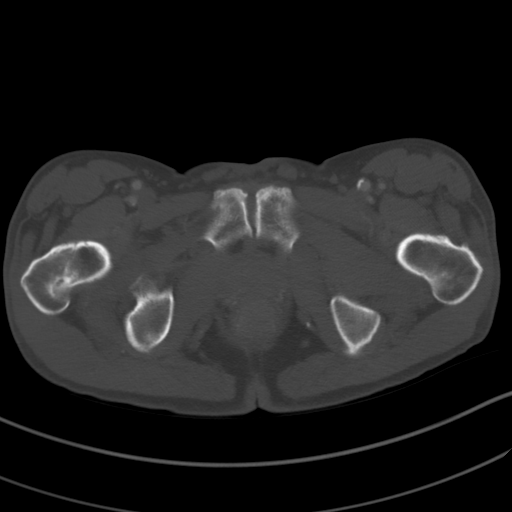
[im 24/132  mediastinal]
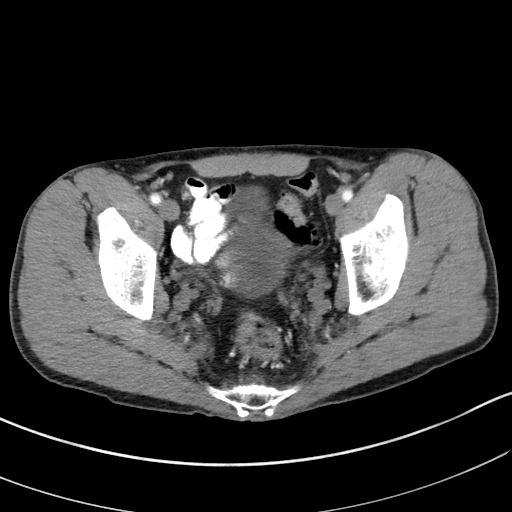
[im 36/132  mediastinal]
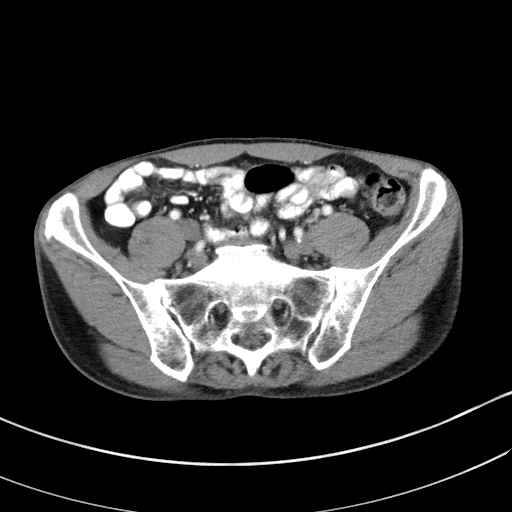
[im 48/132  mediastinal]
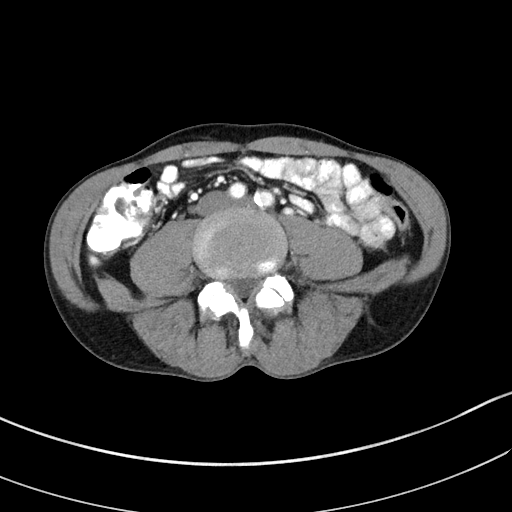
[im 60/132  mediastinal]
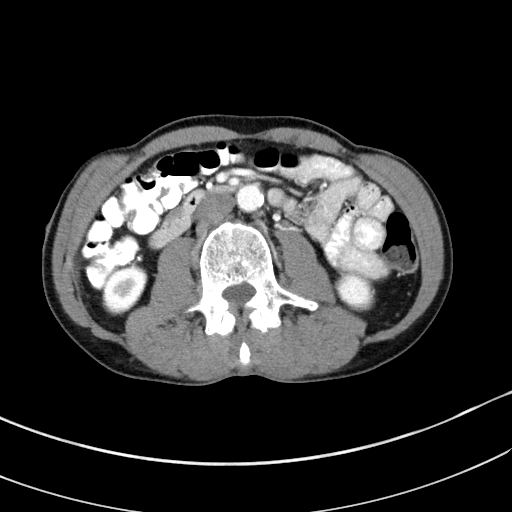
[im 72/132  mediastinal]
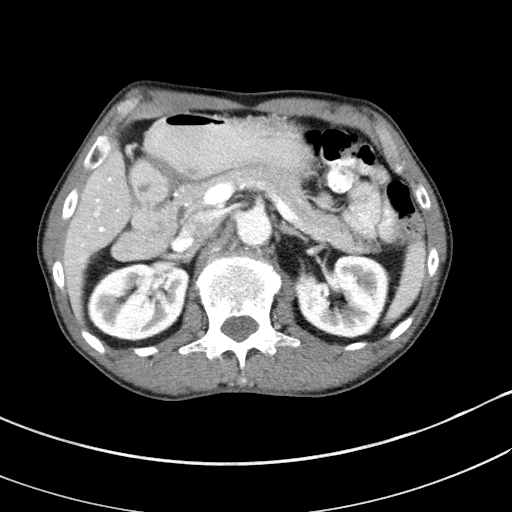
[im 84/132  mediastinal]
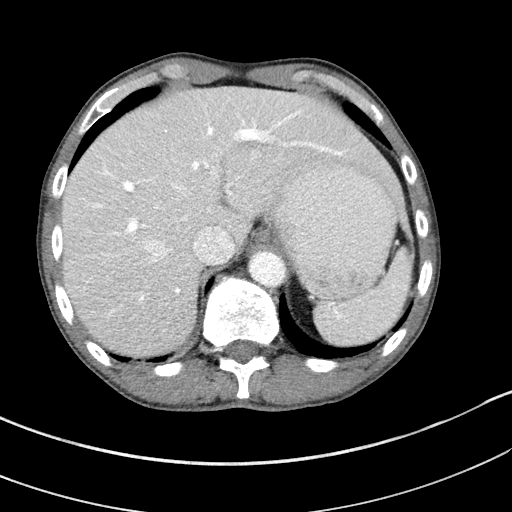
[im 96/132  mediastinal]
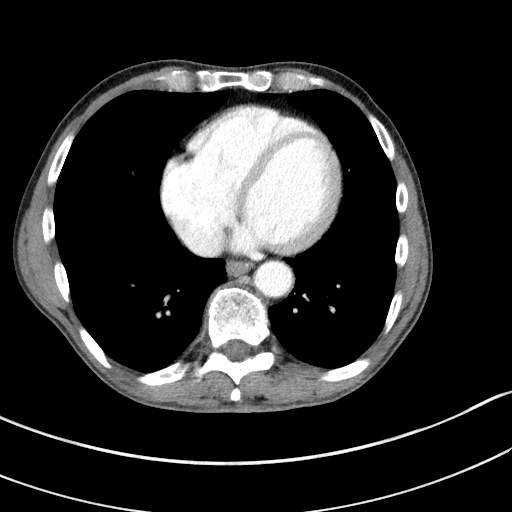
[im 108/132  mediastinal]
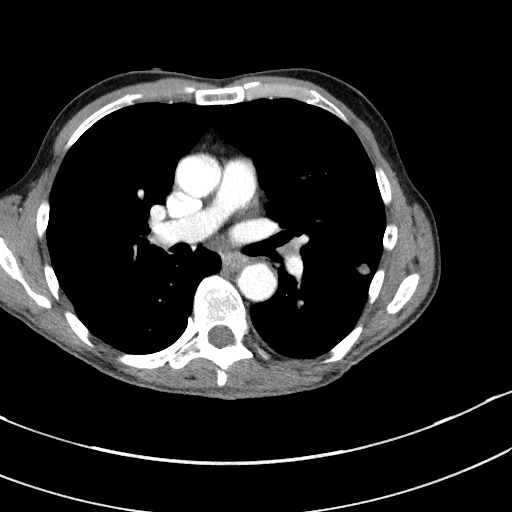
[im 108/132  bone]
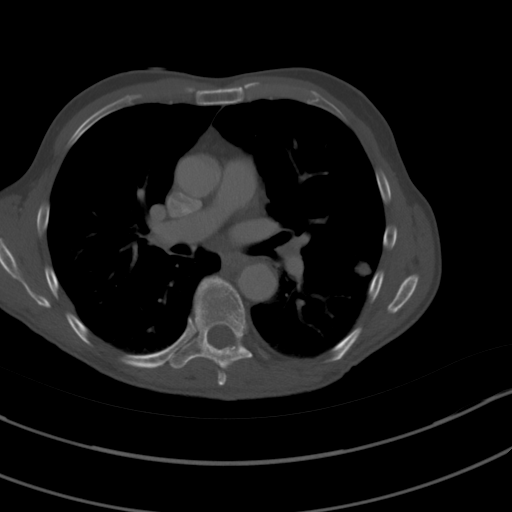
[im 120/132  mediastinal]
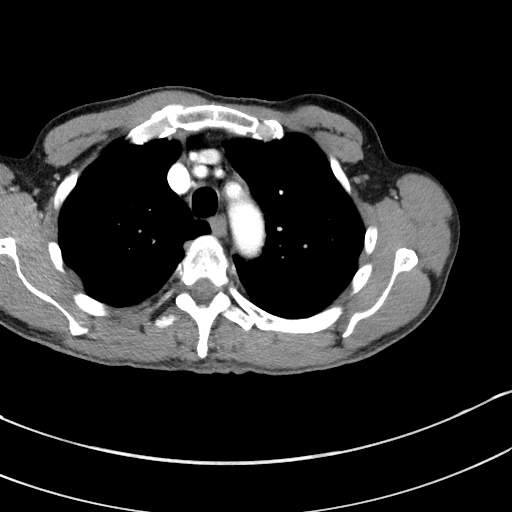

[Series 5: coronals · coronal · 0.61mm/px · 3 of 122 slices shown]
[im 25/122  mediastinal]
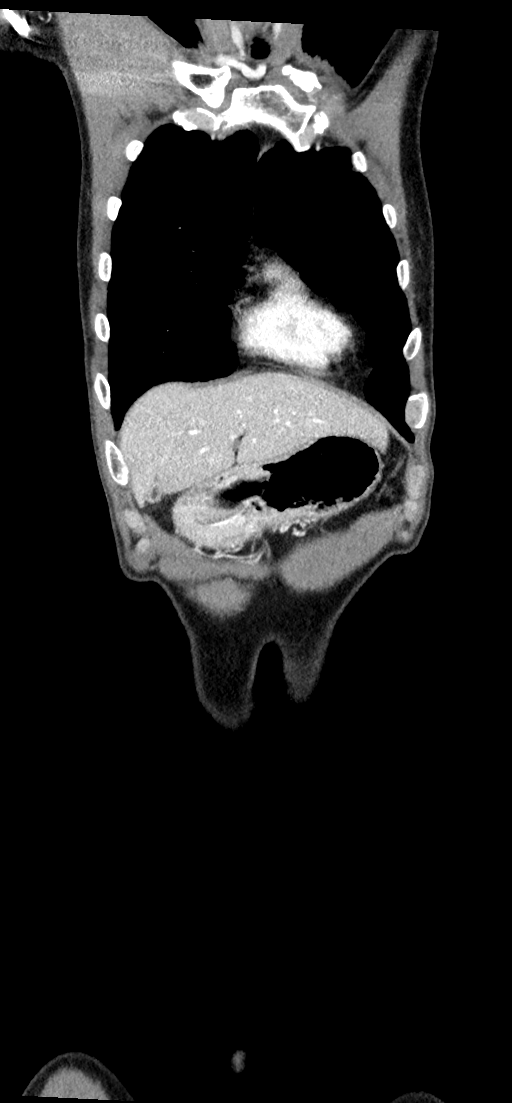
[im 49/122  mediastinal]
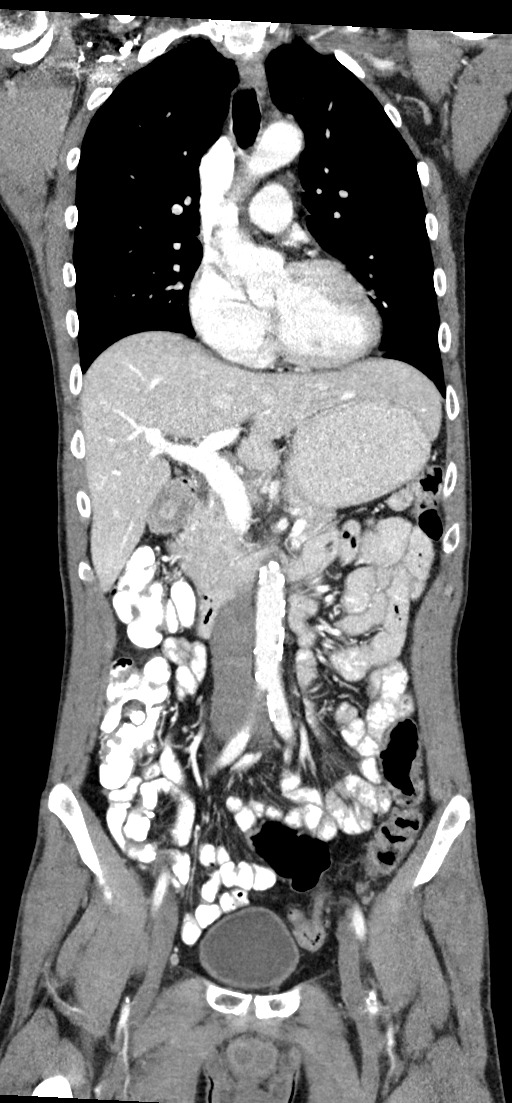
[im 73/122  mediastinal]
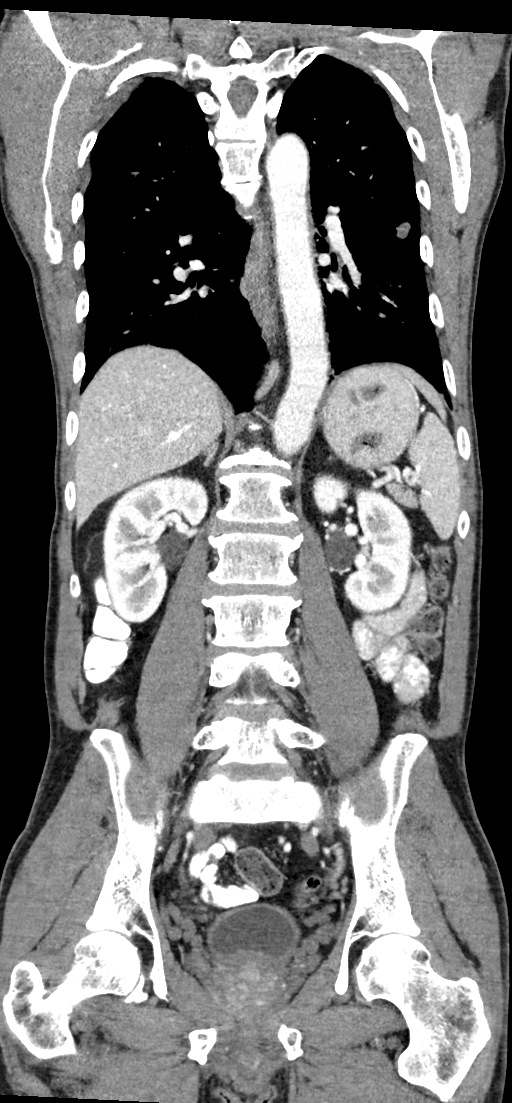

[13 of 36 positions shown; findings below may reference images not displayed]

FINDINGS: CT CHEST FINDINGS

Cardiovascular: Aortic atherosclerosis. Normal heart size. No
pericardial effusion.

Mediastinum/Nodes: No enlarged mediastinal, hilar, or axillary lymph
nodes. Thyroid gland, trachea, and esophagus demonstrate no
significant findings.

Lungs/Pleura: Moderate to severe centrilobular and paraseptal
emphysema. Lobulated nodule of the posterior left upper lobe
abutting the fissure measuring 1.1 x 0.9 cm (series 3, image 61).
Bandlike scarring of the posterior right upper lobe (series 3, image
45). No pleural effusion or pneumothorax.

Musculoskeletal: No chest wall mass or suspicious bone lesions
identified.

CT ABDOMEN PELVIS FINDINGS

Hepatobiliary: No solid liver abnormality is seen. No gallstones,
gallbladder wall thickening, or biliary dilatation.

Pancreas: Unremarkable. No pancreatic ductal dilatation or
surrounding inflammatory changes.

Spleen: Normal in size without significant abnormality.

Adrenals/Urinary Tract: Adrenal glands are unremarkable. Kidneys are
normal, without renal calculi, solid lesion, or hydronephrosis.
Bladder is unremarkable.

Stomach/Bowel: Stomach is within normal limits. Appendix is not
clearly visualized and may be surgically absent. Circumferential
masslike wall thickening of the low rectum, measuring approximately
4.0 x 3.9 x 3.8 cm (series 2, image 118, series 6, image 77).

Vascular/Lymphatic: Aortic atherosclerosis. Pelvic varices. No
enlarged abdominal or pelvic lymph nodes.

Reproductive: Prostatomegaly with median lobe hypertrophy

Other: No abdominal wall hernia or abnormality. No abdominopelvic
ascites.

Musculoskeletal: No acute or significant osseous findings.
IMPRESSION: 1. Circumferential masslike wall thickening of the low rectum,
measuring approximately 4.0 cm, consistent with rectal mass
identified by colonoscopy.
2. Lobulated nodule of the posterior left upper lobe abutting the
fissure measuring 1.1 x 0.9 cm, nonspecific although concerning for
solitary pulmonary metastasis. Given size and composition, this
could likely be characterized by PET-CT for metabolic activity by
PET/CT or alternately sampled for tissue diagnosis.
3. No evidence of metastatic disease in the abdomen or pelvis. No
lymphadenopathy.
4. Emphysema.

Aortic Atherosclerosis ([K4]-[K4]) and Emphysema ([K4]-[K4]).

## 2021-01-12 MED ORDER — IOHEXOL 9 MG/ML PO SOLN
500.0000 mL | ORAL | Status: AC
  Administered 2021-01-12 (×2): 500 mL via ORAL

## 2021-01-12 MED ORDER — POTASSIUM CHLORIDE CRYS ER 20 MEQ PO TBCR
40.0000 meq | EXTENDED_RELEASE_TABLET | Freq: Once | ORAL | Status: AC
  Administered 2021-01-12: 40 meq via ORAL
  Filled 2021-01-12: qty 2

## 2021-01-12 MED ORDER — IOHEXOL 300 MG/ML  SOLN
100.0000 mL | Freq: Once | INTRAMUSCULAR | Status: AC | PRN
  Administered 2021-01-12: 100 mL via INTRAVENOUS

## 2021-01-12 MED ORDER — MAGNESIUM SULFATE 2 GM/50ML IV SOLN
2.0000 g | Freq: Once | INTRAVENOUS | Status: AC
  Administered 2021-01-12: 2 g via INTRAVENOUS
  Filled 2021-01-12: qty 50

## 2021-01-12 MED ORDER — ENSURE ENLIVE PO LIQD
237.0000 mL | Freq: Three times a day (TID) | ORAL | Status: DC
  Administered 2021-01-12 – 2021-01-13 (×4): 237 mL via ORAL
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
## 2021-01-12 NOTE — Progress Notes (Signed)
PROGRESS NOTE    [name]  TLX:[id number] DOB: 09/27/1960 DOA: 01/09/2021 PCP: [facility] [place]    Brief Narrative:  [name] [name] is a 60 y.o. male with medical history significant of no significant past medical history who presents to the emergency department for GI bleeding.  According to the patient over the past 2 months or so he has noted intermittently a small amount of blood at times when he wipes.  He states however morning of presentation he has had 2 large bowel movements of nothing but blood per patient.  States the blood filled the toilet.  Patient denies any abdominal pain denies any nausea or vomiting.  Patient states he has never had a colonoscopy.  Does not take any blood thinners.  Underwent colonoscopy on 10/18.  Tolerated procedure well.  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  Oncology consulted.  CT chest abdomen pelvis ordered.  Possible pulmonary metastasis noted on imaging.  Discussed with oncology.  Recommend radiation oncology evaluation during hospitalization.  This is pending at this time.   Assessment & Plan:   Active Problems:   BRBPR (bright red blood per rectum)   Anxiety and depression   Protein-calorie malnutrition, severe   Rectal mass  BRBPR 2/2 rectal mass --Hgb currently stable ~12. Plan: Status post EGD and colonoscopy.  Rectal mass identified.  Biopsied.  Findings concerning for adenocarcinoma.  Oncology consulted.  Radiation oncology consulted.  Discussed with Dr. Rogue Bussing.  Recommendations appreciated.  Once radiation oncology evaluates patient may be able to discharge on 10/20 with outpatient follow-up.   Anxiety and depression  - admits to significant anxiety as a long term problem. Also gives history of depression. Both symptoms are worse since his brothers suicide and the loss of his dog. He reports that he has been given SSRI medications in the past which made him feel  suicidal --pt was started on scheduled ativan on admission, however, pt does not have current Rx for ativan Plan: --resume ativan PRN, as pt is extremely anxious after the rectal mass finding, and refused Atarax   Severe malnutrition in context of social or environmental  --noted weight loss over the past several months -- Likely secondary to malignancy --Nutrition consult     DVT prophylaxis: SCD Code Status: Full Family Communication: None today Disposition Plan: Status is: Inpatient  Remains inpatient appropriate because: Pending Patient evaluation by radiation oncology.  Anticipated date of discharge 10/20.       Level of care: Med-Surg  Consultants:  Oncology  Procedures:  EGD/colonoscopy  Antimicrobials: None   Subjective: Seen and examined.  Understandably anxious and upset regarding diagnosis of possible malignancy.  Objective: Vitals:   01/11/21 2055 01/12/21 0344 01/12/21 0828 01/12/21 1528  BP: 115/74 94/70 97/66  99/71  Pulse: 74 68 60 72  Resp: 16 16 16 16   Temp: 97.8 F (36.6 C) 98.5 F (36.9 C) 98 F (36.7 C) 98.2 F (36.8 C)  TempSrc: Oral Oral Oral Oral  SpO2: 96% 98% 98% 96%  Weight:      Height:        Intake/Output Summary (Last 24 hours) at 01/12/2021 1554 Last data filed at 01/12/2021 1500 Gross per 24 hour  Intake 340 ml  Output --  Net 340 ml   Filed Weights   01/09/21 1315 01/09/21 2318  Weight: 61.2 kg 55.4 kg    Examination:  General exam: Appears anxious Respiratory system: Clear to auscultation. Respiratory effort normal. Cardiovascular system:  S1-S2, RRR, no murmurs, no pedal edema Gastrointestinal system: Thin, nontender, nondistended, normal bowel sounds Central nervous system: Alert and oriented. No focal neurological deficits. Extremities: Symmetric 5 x 5 power. Skin: No rashes, lesions or ulcers Psychiatry: Judgement and insight appear normal. Mood & affect anxious.     Data Reviewed: I have personally  reviewed following labs and imaging studies  CBC: Recent Labs  Lab 01/09/21 1314 01/09/21 1850 01/10/21 0152 01/10/21 0951 01/11/21 0544 01/12/21 0341  WBC 12.1*  --   --   --  9.8 9.8  HGB 13.5 11.6* 12.2* 12.4* 12.5* 11.3*  HCT 39.6 33.2* 34.7* 35.9* 35.4* 32.7*  MCV 93.8  --   --   --  93.9 93.7  PLT 267  --   --   --  222 381   Basic Metabolic Panel: Recent Labs  Lab 01/09/21 1314 01/11/21 0544 01/12/21 0341  NA 139 137 138  K 3.9 3.7 3.4*  CL 103 104 107  CO2 28 27 25   GLUCOSE 132* 97 90  BUN 14 11 13   CREATININE 0.78 0.59* 0.59*  CALCIUM 9.5 9.0 8.7*  MG  --  1.9 1.7   GFR: Estimated Creatinine Clearance: 75 mL/min (A) (by C-G formula based on SCr of 0.59 mg/dL (L)). Liver Function Tests: Recent Labs  Lab 01/09/21 1314  AST 25  ALT 23  ALKPHOS 100  BILITOT 0.7  PROT 7.5  ALBUMIN 4.6   No results for input(s): LIPASE, AMYLASE in the last 168 hours. No results for input(s): AMMONIA in the last 168 hours. Coagulation Profile: Recent Labs  Lab 01/11/21 0544  INR 1.1   Cardiac Enzymes: No results for input(s): CKTOTAL, CKMB, CKMBINDEX, TROPONINI in the last 168 hours. BNP (last 3 results) No results for input(s): PROBNP in the last 8760 hours. HbA1C: Recent Labs    01/10/21 0152  HGBA1C 5.1   CBG: No results for input(s): GLUCAP in the last 168 hours. Lipid Profile: No results for input(s): CHOL, HDL, LDLCALC, TRIG, CHOLHDL, LDLDIRECT in the last 72 hours. Thyroid Function Tests: No results for input(s): TSH, T4TOTAL, FREET4, T3FREE, THYROIDAB in the last 72 hours. Anemia Panel: No results for input(s): VITAMINB12, FOLATE, FERRITIN, TIBC, IRON, RETICCTPCT in the last 72 hours. Sepsis Labs: No results for input(s): PROCALCITON, LATICACIDVEN in the last 168 hours.  Recent Results (from the past 240 hour(s))  Resp Panel by RT-PCR (Flu A&B, Covid) Nasopharyngeal Swab     Status: None   Collection Time: 01/09/21  3:11 PM   Specimen:  Nasopharyngeal Swab; Nasopharyngeal(NP) swabs in vial transport medium  Result Value Ref Range Status   SARS Coronavirus 2 by RT PCR NEGATIVE NEGATIVE Final    Comment: (NOTE) SARS-CoV-2 target nucleic acids are NOT DETECTED.  The SARS-CoV-2 RNA is generally detectable in upper respiratory specimens during the acute phase of infection. The lowest concentration of SARS-CoV-2 viral copies this assay can detect is 138 copies/mL. A negative result does not preclude SARS-Cov-2 infection and should not be used as the sole basis for treatment or other patient management decisions. A negative result may occur with  improper specimen collection/handling, submission of specimen other than nasopharyngeal swab, presence of viral mutation(s) within the areas targeted by this assay, and inadequate number of viral copies(<138 copies/mL). A negative result must be combined with clinical observations, patient history, and epidemiological information. The expected result is Negative.  Fact Sheet for Patients:  EntrepreneurPulse.com.au  Fact Sheet for Healthcare Providers:  IncredibleEmployment.be  This test  is no t yet approved or cleared by the [place] and  has been authorized for detection and/or diagnosis of SARS-CoV-2 by FDA under an Emergency Use Authorization (EUA). This EUA will remain  in effect (meaning this test can be used) for the duration of the COVID-19 declaration under Section 564(b)(1) of the Act, 21 U.S.C.section 360bbb-3(b)(1), unless the authorization is terminated  or revoked sooner.       Influenza A by PCR NEGATIVE NEGATIVE Final   Influenza B by PCR NEGATIVE NEGATIVE Final    Comment: (NOTE) The Xpert Xpress SARS-CoV-2/FLU/RSV plus assay is intended as an aid in the diagnosis of influenza from Nasopharyngeal swab specimens and should not be used as a sole basis for treatment. Nasal washings and aspirates are unacceptable for  Xpert Xpress SARS-CoV-2/FLU/RSV testing.  Fact Sheet for Patients: EntrepreneurPulse.com.au  Fact Sheet for Healthcare Providers: IncredibleEmployment.be  This test is not yet approved or cleared by the [place] FDA and has been authorized for detection and/or diagnosis of SARS-CoV-2 by FDA under an Emergency Use Authorization (EUA). This EUA will remain in effect (meaning this test can be used) for the duration of the COVID-19 declaration under Section 564(b)(1) of the Act, 21 U.S.C. section 360bbb-3(b)(1), unless the authorization is terminated or revoked.  Performed at [facility], [address] [place], [place] [address]          Radiology Studies: CT CHEST ABDOMEN PELVIS W CONTRAST  Result Date: 01/12/2021 CLINICAL DATA:  Colorectal cancer staging, new diagnosis rectal mass EXAM: CT CHEST, ABDOMEN, AND PELVIS WITH CONTRAST TECHNIQUE: Multidetector CT imaging of the chest, abdomen and pelvis was performed following the standard protocol during bolus administration of intravenous contrast. CONTRAST:  190mL OMNIPAQUE IOHEXOL 300 MG/ML SOLN, additional oral enteric contrast COMPARISON:  None. FINDINGS: CT CHEST FINDINGS Cardiovascular: Aortic atherosclerosis. Normal heart size. No pericardial effusion. Mediastinum/Nodes: No enlarged mediastinal, hilar, or axillary lymph nodes. Thyroid gland, trachea, and esophagus demonstrate no significant findings. Lungs/Pleura: Moderate to severe centrilobular and paraseptal emphysema. Lobulated nodule of the posterior left upper lobe abutting the fissure measuring 1.1 x 0.9 cm (series 3, image 61). Bandlike scarring of the posterior right upper lobe (series 3, image 45). No pleural effusion or pneumothorax. Musculoskeletal: No chest wall mass or suspicious bone lesions identified. CT ABDOMEN PELVIS FINDINGS Hepatobiliary: No solid liver abnormality is seen. No gallstones, gallbladder wall  thickening, or biliary dilatation. Pancreas: Unremarkable. No pancreatic ductal dilatation or surrounding inflammatory changes. Spleen: Normal in size without significant abnormality. Adrenals/Urinary Tract: Adrenal glands are unremarkable. Kidneys are normal, without renal calculi, solid lesion, or hydronephrosis. Bladder is unremarkable. Stomach/Bowel: Stomach is within normal limits. Appendix is not clearly visualized and may be surgically absent. Circumferential masslike wall thickening of the low rectum, measuring approximately 4.0 x 3.9 x 3.8 cm (series 2, image 118, series 6, image 77). Vascular/Lymphatic: Aortic atherosclerosis. Pelvic varices. No enlarged abdominal or pelvic lymph nodes. Reproductive: Prostatomegaly with median lobe hypertrophy Other: No abdominal wall hernia or abnormality. No abdominopelvic ascites. Musculoskeletal: No acute or significant osseous findings. IMPRESSION: 1. Circumferential masslike wall thickening of the low rectum, measuring approximately 4.0 cm, [name], [name], this could likely be characterized by PET-CT for metabolic activity by PET/CT or alternately sampled for tissue diagnosis. 3. No evidence of metastatic disease in  the abdomen or pelvis. No lymphadenopathy. 4. Emphysema. Aortic Atherosclerosis (ICD10-I70.0) and Emphysema (ICD10-J43.9). Electronically Signed   By: Delanna Ahmadi M.D.   On: 01/12/2021 12:56        Scheduled Meds:  feeding supplement  237 mL Oral TID BM   sodium chloride flush  3 mL Intravenous Q12H   Continuous Infusions:  sodium chloride       LOS: 0 days    Time spent: 25 minutes    Sidney Ace, MD [facility]   If 7PM-7AM, please contact night-coverage  01/12/2021, 3:54 PM

## 2021-01-12 NOTE — Assessment & Plan Note (Addendum)
#  60 year old male patient is currently admitted to hospital for bright red blood per rectum-colonoscopy noted to have rectal mass  #Rectal mass-s/p biopsy-positive for adenocarcinoma.  CT scan abdomen pelvis-approximately 1 cm left upper lobe lung nodule; otherwise no evidence of distant metastatic disease noted.  Discussed with patient that he will need an MRI of the rectum to further evaluate the disease; and also will need a PET scan for further evaluation.  Also discussed evaluation with radiation oncology.  See below.  #Bright red blood per rectum-secondary to rectal mass; hemoglobin stable around 11-stable.  #Had a long discussion with family regarding the need for neoadjuvant therapy-chemotherapy/5-FU radiation-followed by definitive surgery.  Patient is quite emotional/tearful.  Patient had family members who had a rough time to chemotherapy/radiation.  We will have the patient follow-up in the clinic-to discuss the treatment plan-including port placement/chemotherapy/radiation therapy/surgical plan.  Discussed with Dr. Donella Stade.  Discussed with Dr.Sreenath.   # 40 minutes face-to-face with the patient discussing the above plan of care; more than 50% of time spent on prognosis/ natural history; counseling and coordination.

## 2021-01-13 ENCOUNTER — Ambulatory Visit: Payer: Medicaid Other | Admitting: Radiation Oncology

## 2021-01-13 ENCOUNTER — Other Ambulatory Visit: Payer: Self-pay | Admitting: Internal Medicine

## 2021-01-13 DIAGNOSIS — K625 Hemorrhage of anus and rectum: Secondary | ICD-10-CM | POA: Diagnosis not present

## 2021-01-13 DIAGNOSIS — C20 Malignant neoplasm of rectum: Secondary | ICD-10-CM

## 2021-01-13 LAB — CBC
HCT: 34.8 % — ABNORMAL LOW (ref 39.0–52.0)
Hemoglobin: 11.9 g/dL — ABNORMAL LOW (ref 13.0–17.0)
MCH: 31.8 pg (ref 26.0–34.0)
MCHC: 34.2 g/dL (ref 30.0–36.0)
MCV: 93 fL (ref 80.0–100.0)
Platelets: 258 K/uL (ref 150–400)
RBC: 3.74 MIL/uL — ABNORMAL LOW (ref 4.22–5.81)
RDW: 12.4 % (ref 11.5–15.5)
WBC: 9.2 K/uL (ref 4.0–10.5)
nRBC: 0 % (ref 0.0–0.2)

## 2021-01-13 LAB — CEA: CEA: 10.7 ng/mL — ABNORMAL HIGH (ref 0.0–4.7)

## 2021-01-13 LAB — BASIC METABOLIC PANEL WITH GFR
Anion gap: 7 (ref 5–15)
BUN: 18 mg/dL (ref 8–23)
CO2: 25 mmol/L (ref 22–32)
Calcium: 9 mg/dL (ref 8.9–10.3)
Chloride: 105 mmol/L (ref 98–111)
Creatinine, Ser: 0.77 mg/dL (ref 0.61–1.24)
GFR, Estimated: >60 mL/min
Glucose, Bld: 100 mg/dL — ABNORMAL HIGH (ref 70–99)
Potassium: 4.1 mmol/L (ref 3.5–5.1)
Sodium: 137 mmol/L (ref 135–145)

## 2021-01-13 LAB — MAGNESIUM: Magnesium: 1.8 mg/dL (ref 1.7–2.4)

## 2021-01-13 MED ORDER — ALPRAZOLAM 0.25 MG PO TABS: 0.2500 mg | ORAL_TABLET | Freq: Two times a day (BID) | ORAL | 0 refills | Status: DC | PRN

## 2021-01-13 MED ORDER — ALPRAZOLAM 0.25 MG PO TABS: 0.2500 mg | ORAL_TABLET | Freq: Three times a day (TID) | ORAL | Status: DC | PRN

## 2021-01-13 MED ORDER — PANTOPRAZOLE SODIUM 40 MG PO TBEC: 40.0000 mg | DELAYED_RELEASE_TABLET | Freq: Every day | ORAL | 0 refills | Status: DC

## 2021-01-13 NOTE — Progress Notes (Signed)
[name] [name]   DOB:01-Sep-1960   DX#:[id number]    Subjective: Denies any nausea vomiting.  Complains of inability to have a bowel movement.  Denies having blood per rectum.  No chest pain.  Objective:  Vitals:   01/13/21 0423 01/13/21 0829  BP: 103/74 116/77  Pulse: 73 63  Resp: 16 17  Temp: 97.8 F (36.6 C) 97.8 F (36.6 C)  SpO2: 98% 95%     Intake/Output Summary (Last 24 hours) at 01/13/2021 2234 Last data filed at 01/13/2021 1427 Gross per 24 hour  Intake 480 ml  Output --  Net 480 ml    Physical Exam Vitals and nursing note reviewed.  HENT:     Head: Normocephalic and atraumatic.     Mouth/Throat:     Pharynx: Oropharynx is clear.  Eyes:     Extraocular Movements: Extraocular movements intact.     Pupils: Pupils are equal, round, and reactive to light.  Cardiovascular:     Rate and Rhythm: Normal rate and regular rhythm.  Pulmonary:     Comments: Decreased breath sounds bilaterally.  Abdominal:     Palpations: Abdomen is soft.  Musculoskeletal:        General: Normal range of motion.     Cervical back: Normal range of motion.  Skin:    General: Skin is warm.  Neurological:     General: No focal deficit present.     Mental Status: He is alert and oriented to person, place, and time.  Psychiatric:        Behavior: Behavior normal.        Judgment: Judgment normal.     Labs:  Lab Results  Component Value Date   WBC 9.2 01/13/2021   HGB 11.9 (L) 01/13/2021   HCT 34.8 (L) 01/13/2021   MCV 93.0 01/13/2021   PLT 258 01/13/2021   NEUTROABS 6.5 07/25/2016    Lab Results  Component Value Date   NA 137 01/13/2021   K 4.1 01/13/2021   CL 105 01/13/2021   CO2 25 01/13/2021    Studies:  CT CHEST ABDOMEN PELVIS W CONTRAST  Result Date: 01/12/2021 CLINICAL DATA:  Colorectal cancer staging, new diagnosis rectal mass EXAM: CT CHEST, ABDOMEN, AND PELVIS WITH CONTRAST TECHNIQUE: Multidetector CT imaging of the chest, abdomen and pelvis was performed  following the standard protocol during bolus administration of intravenous contrast. CONTRAST:  155mL OMNIPAQUE IOHEXOL 300 MG/ML SOLN, additional oral enteric contrast COMPARISON:  None. FINDINGS: CT CHEST FINDINGS Cardiovascular: Aortic atherosclerosis. Normal heart size. No pericardial effusion. Mediastinum/Nodes: No enlarged mediastinal, hilar, or axillary lymph nodes. Thyroid gland, trachea, and esophagus demonstrate no significant findings. Lungs/Pleura: Moderate to severe centrilobular and paraseptal emphysema. Lobulated nodule of the posterior left upper lobe abutting the fissure measuring 1.1 x 0.9 cm (series 3, image 61). Bandlike scarring of the posterior right upper lobe (series 3, image 45). No pleural effusion or pneumothorax. Musculoskeletal: No chest wall mass or suspicious bone lesions identified. CT ABDOMEN PELVIS FINDINGS Hepatobiliary: No solid liver abnormality is seen. No gallstones, gallbladder wall thickening, or biliary dilatation. Pancreas: Unremarkable. No pancreatic ductal dilatation or surrounding inflammatory changes. Spleen: Normal in size without significant abnormality. Adrenals/Urinary Tract: Adrenal glands are unremarkable. Kidneys are normal, without renal calculi, solid lesion, or hydronephrosis. Bladder is unremarkable. Stomach/Bowel: Stomach is within normal limits. Appendix is not clearly visualized and may be surgically absent. Circumferential masslike wall thickening of the low rectum, measuring approximately 4.0 x 3.9 x 3.8 cm (series 2, image 118,  series 6, image 77). Vascular/Lymphatic: Aortic atherosclerosis. Pelvic varices. No enlarged abdominal or pelvic lymph nodes. Reproductive: Prostatomegaly with median lobe hypertrophy Other: No abdominal wall hernia or abnormality. No abdominopelvic ascites. Musculoskeletal: No acute or significant osseous findings. IMPRESSION: 1. Circumferential masslike wall thickening of the low rectum, measuring approximately 4.0 cm,  [name], [name], this could likely be characterized by PET-CT for metabolic activity by PET/CT or alternately sampled for tissue diagnosis. 3. No evidence of metastatic disease in the abdomen or pelvis. No lymphadenopathy. 4. Emphysema. Aortic Atherosclerosis (ICD10-I70.0) and Emphysema (ICD10-J43.9). Electronically Signed   By: Delanna Ahmadi M.D.   On: 01/12/2021 12:56    Rectal cancer [facility]) #60 year old male patient is currently admitted to hospital for bright red blood per rectum-colonoscopy noted to have rectal mass  #Rectal mass-s/p biopsy-positive for adenocarcinoma.  CT scan abdomen pelvis-approximately 1 cm left upper lobe lung nodule; otherwise no evidence of distant metastatic disease noted.  Discussed with patient that he will need an MRI of the rectum to further evaluate the disease; and also will need a PET scan for further evaluation.  Also discussed evaluation with radiation oncology.  See below.  #Bright red blood per rectum-secondary to rectal mass; hemoglobin stable around 11-stable.  #Had a long discussion with family regarding the need for neoadjuvant therapy-chemotherapy/5-FU radiation-followed by definitive surgery.  Patient is quite emotional/tearful.  Patient had family members who had a rough time to chemotherapy/radiation.  We will have the patient follow-up in the clinic-to discuss the treatment plan-including port placement/chemotherapy/radiation therapy/surgical plan.  Discussed with Dr. Donella Stade.  Discussed with Dr.Sreenath.   # 40 minutes face-to-face with the patient discussing the above plan of care; more than 50% of time spent on prognosis/ natural history; counseling and coordination.   Cammie Sickle, MD 01/13/2021  10:34 PM

## 2021-01-13 NOTE — Progress Notes (Signed)
C- please schedule the PET and MRI after kristie speaks to pt.   M-appointment on on 10/27- MD; labs- cbc/cmp- Dr.B

## 2021-01-13 NOTE — TOC CM/SW Note (Signed)
Patient has orders to discharge home today. Chart reviewed. PCP is [place]. On room air. No wounds. No TOC needs identified. CSW signing off.  [name] [name], [name]
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
## 2021-01-13 NOTE — Discharge Summary (Signed)
Physician Discharge Summary  [name] [name] GEX:[id number] DOB: 1960-08-16 DOA: 01/09/2021  PCP: [facility] [place] date: 01/09/2021 Discharge date: 01/13/2021  Admitted From: Home Disposition: Home  Recommendations for Outpatient Follow-up:  Follow up with PCP in 1-2 weeks Follow-up with radiation oncology on 10/21 9 AM Follow-up with oncology as directed  Home Health: No Equipment/Devices: None  Discharge Condition: Stable CODE STATUS: Full Diet recommendation: Regular  Brief/Interim Summary: 60 y.o. male with medical history significant of no significant past medical history who presents to the emergency department for GI bleeding.  According to the patient over the past 2 months or so he has noted intermittently a small amount of blood at times when he wipes.  He states however morning of presentation he has had 2 large bowel movements of nothing but blood per patient.  States the blood filled the toilet.  Patient denies any abdominal pain denies any nausea or vomiting.  Patient states he has never had a colonoscopy.  Does not take any blood thinners.  Underwent colonoscopy on 10/18.  Tolerated procedure well.  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  Oncology consulted.  CT chest abdomen pelvis ordered.  Possible pulmonary metastasis noted on imaging.  Discussed with oncology.  Recommend radiation oncology evaluation during hospitalization.  Seen in consultation by radiation oncology during hospitalization.  Recommend initiation of targeted radiation therapy.  Patient will present to radiation oncology clinic on post discharge day #110/21 for CT simulation.  Patient had significant issues with anxiety and labile mood during hospitalization.  Visibly upset regarding diagnosis.  As such we had a lengthy discussion and I agreed to prescribe a small amount of p.o. Xanax at time of discharge to control anxiety symptoms and allow  for better sleep.    Discharge Diagnoses:  Active Problems:   BRBPR (bright red blood per rectum)   Anxiety and depression   Protein-calorie malnutrition, severe   Rectal mass   Rectal cancer (HCC) BRBPR 2/2 rectal mass --Hgb currently stable ~12. Plan: Status post EGD and colonoscopy.  Rectal mass identified.  Biopsied.  Findings concerning for adenocarcinoma.  Oncology consulted.  Radiation oncology consulted.  Discussed with Dr. Rogue Bussing.  Recommendations appreciated.  Patient was evaluated by rad Julio Alm during hospitalization.  Recommend initiation of radiation therapy.  Patient will present to rad [facility] clinic on post discharge day #1 10/21 for CT simulation and further discussion regarding targeted radiation therapy   Anxiety and depression  - admits to significant anxiety as a long term problem. Also gives history of depression. Both symptoms are worse since his brothers suicide and the loss of his dog. He reports that he has been given SSRI medications in the past which made him feel suicidal -- At time of discharge will prescribe small amount of p.o. Xanax to allow for anxiety control and better sleep.   Severe malnutrition in context of social or environmental  --noted weight loss over the past several months -- Likely secondary to malignancy --Nutrition consult   Discharge Instructions  Discharge Instructions     Diet - low sodium heart healthy   Complete by: As directed    Increase activity slowly   Complete by: As directed       Allergies as of 01/13/2021   No Known Allergies      Medication List     STOP taking these medications    HYDROcodone-acetaminophen 5-325 MG tablet Commonly known as: Norco  TAKE these medications    ALPRAZolam 0.25 MG tablet Commonly known as: XANAX Take 1 tablet (0.25 mg total) by mouth 2 (two) times daily as needed for anxiety.   pantoprazole 40 MG tablet Commonly known as: Protonix Take 1 tablet (40 mg total) by  mouth daily.        Follow-up [facility] .   Specialty: General Practice Why: they will contact the patient with an appointment Contact information: [address] [place] [address] [phone]                No Known Allergies  Consultations: Oncology Radiation oncology   Procedures/Studies: MR BRAIN WO CONTRAST  Result Date: 12/29/2020 CLINICAL DATA:  Subdural hematoma.  Tinnitus of both ears. EXAM: MRI HEAD WITHOUT CONTRAST TECHNIQUE: Multiplanar, multiecho pulse sequences of the brain and surrounding structures were obtained without intravenous contrast. COMPARISON:  Prior head CT examinations 12/13/2020 and earlier. FINDINGS: Brain: Redemonstrated focus of chronic encephalomalacia/gliosis within the lateral right temporal lobe with associated chronic hemosiderin deposition. This may be posttraumatic in etiology or may reflect a chronic infarct. Associated ex vacuo dilatation of the temporal horn of the right lateral ventricle. Background cerebral volume is normal for age. Curvilinear SWI signal loss along the posterior aspects of the left greater than right cerebral hemispheres and along the cerebellum and brainstem, compatible with sequela of remote subarachnoid hemorrhage. There is no acute infarct. No evidence of an intracranial mass. No extra-axial fluid collection. No midline shift. Vascular: Maintained flow voids within the proximal large arterial vessels. Skull and upper cervical spine: No focal suspicious marrow lesion. Sinuses/Orbits: Visualized orbits show no acute finding. Redemonstrated chronic medially displaced fracture deformity of the left lamina papyracea. Trace scattered paranasal sinus mucosal thickening at the imaged levels. IMPRESSION: No evidence of acute intracranial abnormality. Redemonstrated focus of chronic encephalomalacia/gliosis within the lateral right temporal lobe. This may be  posttraumatic in etiology or may reflect a chronic infarct. Curvilinear SWI signal loss along the posterior aspects of the left greater than right cerebral hemispheres, and along the cerebellum and brainstem, compatible with sequela of remote subarachnoid hemorrhage. Otherwise unremarkable non-contrast MRI appearance of the brain for age. Electronically Signed   By: Kellie Simmering D.O.   On: 12/29/2020 15:07   CT CHEST ABDOMEN PELVIS W CONTRAST  Result Date: 01/12/2021 CLINICAL DATA:  Colorectal cancer staging, new diagnosis rectal mass EXAM: CT CHEST, ABDOMEN, AND PELVIS WITH CONTRAST TECHNIQUE: Multidetector CT imaging of the chest, abdomen and pelvis was performed following the standard protocol during bolus administration of intravenous contrast. CONTRAST:  172mL OMNIPAQUE IOHEXOL 300 MG/ML SOLN, additional oral enteric contrast COMPARISON:  None. FINDINGS: CT CHEST FINDINGS Cardiovascular: Aortic atherosclerosis. Normal heart size. No pericardial effusion. Mediastinum/Nodes: No enlarged mediastinal, hilar, or axillary lymph nodes. Thyroid gland, trachea, and esophagus demonstrate no significant findings. Lungs/Pleura: Moderate to severe centrilobular and paraseptal emphysema. Lobulated nodule of the posterior left upper lobe abutting the fissure measuring 1.1 x 0.9 cm (series 3, image 61). Bandlike scarring of the posterior right upper lobe (series 3, image 45). No pleural effusion or pneumothorax. Musculoskeletal: No chest wall mass or suspicious bone lesions identified. CT ABDOMEN PELVIS FINDINGS Hepatobiliary: No solid liver abnormality is seen. No gallstones, gallbladder wall thickening, or biliary dilatation. Pancreas: Unremarkable. No pancreatic ductal dilatation or surrounding inflammatory changes. Spleen: Normal in size without significant abnormality. Adrenals/Urinary Tract: Adrenal glands are unremarkable. Kidneys are normal, without renal calculi, solid lesion,  or hydronephrosis. Bladder is  unremarkable. Stomach/Bowel: Stomach is within normal limits. Appendix is not clearly visualized and may be surgically absent. Circumferential masslike wall thickening of the low rectum, measuring approximately 4.0 x 3.9 x 3.8 cm (series 2, image 118, series 6, image 77). Vascular/Lymphatic: Aortic atherosclerosis. Pelvic varices. No enlarged abdominal or pelvic lymph nodes. Reproductive: Prostatomegaly with median lobe hypertrophy Other: No abdominal wall hernia or abnormality. No abdominopelvic ascites. Musculoskeletal: No acute or significant osseous findings. IMPRESSION: 1. Circumferential masslike wall thickening of the low rectum, measuring approximately 4.0 cm, [name], [name], this could likely be characterized by PET-CT for metabolic activity by PET/CT or alternately sampled for tissue diagnosis. 3. No evidence of metastatic disease in the abdomen or pelvis. No lymphadenopathy. 4. Emphysema. Aortic Atherosclerosis (ICD10-I70.0) and Emphysema (ICD10-J43.9). Electronically Signed   By: Delanna Ahmadi M.D.   On: 01/12/2021 12:56      Subjective: Patient seen and examined at the time of discharge.  Stable for discharge from a medical standpoint.  Visibly upset and anxious regarding diagnosis.  Discharge Exam: Vitals:   01/13/21 0423 01/13/21 0829  BP: 103/74 116/77  Pulse: 73 63  Resp: 16 17  Temp: 97.8 F (36.6 C) 97.8 F (36.6 C)  SpO2: 98% 95%   Vitals:   01/12/21 1528 01/12/21 2005 01/13/21 0423 01/13/21 0829  BP: 99/71 96/71 103/74 116/77  Pulse: 72 78 73 63  Resp: 16 15 16 17   Temp: 98.2 F (36.8 C) 98.9 F (37.2 C) 97.8 F (36.6 C) 97.8 F (36.6 C)  TempSrc: Oral Oral Oral Oral  SpO2: 96% 97% 98% 95%  Weight:      Height:        General: Pt is  alert, awake, in emotional distress due to diagnosis Cardiovascular: RRR, S1/S2 +, no rubs, no gallops Respiratory: CTA bilaterally, no wheezing, no rhonchi Abdominal: Soft, NT, ND, bowel sounds + Extremities: no edema, no cyanosis    The results of significant diagnostics from this hospitalization (including imaging, microbiology, ancillary and laboratory) are listed below for reference.     Microbiology: Recent Results (from the past 240 hour(s))  Resp Panel by RT-PCR (Flu A&B, Covid) Nasopharyngeal Swab     Status: None   Collection Time: 01/09/21  3:11 PM   Specimen: Nasopharyngeal Swab; Nasopharyngeal(NP) swabs in vial transport medium  Result Value Ref Range Status   SARS Coronavirus 2 by RT PCR NEGATIVE NEGATIVE Final    Comment: (NOTE) SARS-CoV-2 target nucleic acids are NOT DETECTED.  The SARS-CoV-2 RNA is generally detectable in upper respiratory specimens during the acute phase of infection. The lowest concentration of SARS-CoV-2 viral copies this assay can detect is 138 copies/mL. A negative result does not preclude SARS-Cov-2 infection and should not be used as the sole basis for treatment or other patient management decisions. A negative result may occur with  improper specimen collection/handling, submission of specimen other than nasopharyngeal swab, presence of viral mutation(s) within the areas targeted by this assay, and inadequate number of viral copies(<138 copies/mL). A negative result must be combined with clinical observations, patient history, and epidemiological information. The expected result is Negative.  Fact Sheet for Patients:  EntrepreneurPulse.com.au  Fact Sheet for Healthcare Providers:  IncredibleEmployment.be  This test is no t yet approved or cleared by the [place]  FDA and  has been authorized for detection and/or diagnosis of SARS-CoV-2 by FDA under an Emergency Use Authorization (EUA). This EUA  will remain  in effect (meaning this test can be used) for the duration of the COVID-19 declaration under Section 564(b)(1) of the Act, 21 U.S.C.section 360bbb-3(b)(1), unless the authorization is terminated  or revoked sooner.       Influenza A by PCR NEGATIVE NEGATIVE Final   Influenza B by PCR NEGATIVE NEGATIVE Final    Comment: (NOTE) The Xpert Xpress SARS-CoV-2/FLU/RSV plus assay is intended as an aid in the diagnosis of influenza from Nasopharyngeal swab specimens and should not be used as a sole basis for treatment. Nasal washings and aspirates are unacceptable for Xpert Xpress SARS-CoV-2/FLU/RSV testing.  Fact Sheet for Patients: EntrepreneurPulse.com.au  Fact Sheet for Healthcare Providers: IncredibleEmployment.be  This test is not yet approved or cleared by the [place] FDA and has been authorized for detection and/or diagnosis of SARS-CoV-2 by FDA under an Emergency Use Authorization (EUA). This EUA will remain in effect (meaning this test can be used) for the duration of the COVID-19 declaration under Section 564(b)(1) of the Act, 21 U.S.C. section 360bbb-3(b)(1), unless the authorization is terminated or revoked.  Performed at [facility], [address] [place],  [address]      Labs: BNP (last 3 results) No results for input(s): BNP in the last 8760 hours. Basic Metabolic Panel: Recent Labs  Lab 01/09/21 1314 01/11/21 0544 01/12/21 0341 01/13/21 0443  NA 139 137 138 137  K 3.9 3.7 3.4* 4.1  CL 103 104 107 105  CO2 28 27 25 25   GLUCOSE 132* 97 90 100*  BUN 14 11 13 18   CREATININE 0.78 0.59* 0.59* 0.77  CALCIUM 9.5 9.0 8.7* 9.0  MG  --  1.9 1.7 1.8   Liver Function Tests: Recent Labs  Lab 01/09/21 1314  AST 25  ALT 23  ALKPHOS 100  BILITOT 0.7  PROT 7.5  ALBUMIN 4.6   No results for input(s): LIPASE, AMYLASE in the last 168 hours. No results for input(s): AMMONIA in the last 168  hours. CBC: Recent Labs  Lab 01/09/21 1314 01/09/21 1850 01/10/21 0152 01/10/21 0951 01/11/21 0544 01/12/21 0341 01/13/21 0443  WBC 12.1*  --   --   --  9.8 9.8 9.2  HGB 13.5   < > 12.2* 12.4* 12.5* 11.3* 11.9*  HCT 39.6   < > 34.7* 35.9* 35.4* 32.7* 34.8*  MCV 93.8  --   --   --  93.9 93.7 93.0  PLT 267  --   --   --  222 242 258   < > = values in this interval not displayed.   Cardiac Enzymes: No results for input(s): CKTOTAL, CKMB, CKMBINDEX, TROPONINI in the last 168 hours. BNP: Invalid input(s): POCBNP CBG: No results for input(s): GLUCAP in the last 168 hours. D-Dimer No results for input(s): DDIMER in the last 72 hours. Hgb A1c No results for input(s): HGBA1C in the last 72 hours. Lipid Profile No results for input(s): CHOL, HDL, LDLCALC, TRIG, CHOLHDL, LDLDIRECT in the last 72 hours. Thyroid function studies No results for input(s): TSH, T4TOTAL, T3FREE, THYROIDAB in the last 72 hours.  Invalid input(s): FREET3 Anemia work up No results for input(s): VITAMINB12, FOLATE, FERRITIN, TIBC, IRON, RETICCTPCT in the last 72 hours. Urinalysis No results found for: COLORURINE, APPEARANCEUR, LABSPEC, [place], GLUCOSEU, HGBUR, BILIRUBINUR, KETONESUR, PROTEINUR, UROBILINOGEN, NITRITE, LEUKOCYTESUR Sepsis Labs Invalid input(s): PROCALCITONIN,  WBC,  [facility] Microbiology Recent Results (from the past 240 hour(s))  Resp Panel by RT-PCR (Flu A&B, Covid) Nasopharyngeal Swab     Status: None   Collection Time: 01/09/21  3:11 PM   Specimen: Nasopharyngeal Swab; Nasopharyngeal(NP) swabs in vial transport medium  Result Value Ref Range Status   SARS Coronavirus 2 by RT PCR NEGATIVE NEGATIVE Final    Comment: (NOTE) SARS-CoV-2 target nucleic acids are NOT DETECTED.  The SARS-CoV-2 RNA is generally detectable in upper respiratory specimens during the acute phase of infection. The lowest concentration of SARS-CoV-2 viral copies this assay can detect is 138 copies/mL. A  negative result does not preclude SARS-Cov-2 infection and should not be used as the sole basis for treatment or other patient management decisions. A negative result may occur with  improper specimen collection/handling, submission of specimen other than nasopharyngeal swab, presence of viral mutation(s) within the areas targeted by this assay, and inadequate number of viral copies(<138 copies/mL). A negative result must be combined with clinical observations, patient history, and epidemiological information. The expected result is Negative.  Fact Sheet for Patients:  EntrepreneurPulse.com.au  Fact Sheet for Healthcare Providers:  IncredibleEmployment.be  This test is no t yet approved or cleared by the [place] FDA and  has been authorized for detection and/or diagnosis of SARS-CoV-2 by FDA under an Emergency Use Authorization (EUA). This EUA will remain  in effect (meaning this test can be used) for the duration of the COVID-19 declaration under Section 564(b)(1) of the Act, 21 U.S.C.section 360bbb-3(b)(1), unless the authorization is terminated  or revoked sooner.       Influenza A by PCR NEGATIVE NEGATIVE Final   Influenza B by PCR NEGATIVE NEGATIVE Final    Comment: (NOTE) The Xpert Xpress SARS-CoV-2/FLU/RSV plus assay is intended as an aid in the diagnosis of influenza from Nasopharyngeal swab specimens and should not be used as a sole basis for treatment. Nasal washings and aspirates are unacceptable for Xpert Xpress SARS-CoV-2/FLU/RSV testing.  Fact Sheet for Patients: EntrepreneurPulse.com.au  Fact Sheet for Healthcare Providers: IncredibleEmployment.be  This test is not yet approved or cleared by the [place] FDA and has been authorized for detection and/or diagnosis of SARS-CoV-2 by FDA under an Emergency Use Authorization (EUA). This EUA will remain in effect (meaning this test can  be used) for the duration of the COVID-19 declaration under Section 564(b)(1) of the Act, 21 U.S.C. section 360bbb-3(b)(1), unless the authorization is terminated or revoked.  Performed at [facility], [address] [place], [place] [address]      Time coordinating discharge: Over 30 minutes  SIGNED:   Sidney Ace, MD  [facility] 01/13/2021, 3:59 PM Pager   If 7PM-7AM, please contact night-coverage

## 2021-01-13 NOTE — Progress Notes (Signed)
Patient discharged to home accompanied by family. Patient refused wheelchair. Patient discharged with all pertinent information, prescriptions, and personal belongings.  Patient able to teach back instructions. IV sites d/ced prior to discharge.  No acute distress noted. Verified with primary nurse, no needs prior to discharge.  Care relinquished.

## 2021-01-14 ENCOUNTER — Telehealth: Payer: Self-pay

## 2021-01-14 ENCOUNTER — Ambulatory Visit
Admission: RE | Admit: 2021-01-14 | Discharge: 2021-01-14 | Disposition: A | Discharge status: Home / Self Care | Payer: Medicaid Other | Source: Ambulatory Visit | Attending: Radiation Oncology | Admitting: Radiation Oncology

## 2021-01-14 DIAGNOSIS — F419 Anxiety disorder, unspecified: Secondary | ICD-10-CM | POA: Diagnosis not present

## 2021-01-14 DIAGNOSIS — Z87891 Personal history of nicotine dependence: Secondary | ICD-10-CM | POA: Diagnosis not present

## 2021-01-14 DIAGNOSIS — C20 Malignant neoplasm of rectum: Secondary | ICD-10-CM | POA: Diagnosis present

## 2021-01-14 DIAGNOSIS — Z510 Encounter for antineoplastic radiation therapy: Secondary | ICD-10-CM | POA: Diagnosis present

## 2021-01-14 DIAGNOSIS — D509 Iron deficiency anemia, unspecified: Secondary | ICD-10-CM | POA: Diagnosis not present

## 2021-01-14 NOTE — Telephone Encounter (Signed)
Called to speak with Mr. Bartoszek about upcoming appointments and plan. He asked me to speak with his daughter in law, Kathlee Nations. We went over upcoming appointments with Dr. Rogue Bussing, MRI and PET. I will meet with them when they come 10/26 for radiation and answer any additional questions. Made them aware that surgery will also be reaching out to arrange for PORT placement for chemotherapy infusions. Provided my contact information for future needs.

## 2021-01-17 DIAGNOSIS — Z510 Encounter for antineoplastic radiation therapy: Secondary | ICD-10-CM | POA: Diagnosis not present

## 2021-01-19 ENCOUNTER — Ambulatory Visit: Admission: RE | Admit: 2021-01-19 | Payer: Medicaid Other | Source: Ambulatory Visit

## 2021-01-19 DIAGNOSIS — Z510 Encounter for antineoplastic radiation therapy: Secondary | ICD-10-CM | POA: Diagnosis not present

## 2021-01-20 ENCOUNTER — Inpatient Hospital Stay: Payer: Medicaid Other

## 2021-01-20 ENCOUNTER — Ambulatory Visit
Admission: RE | Admit: 2021-01-20 | Discharge: 2021-01-20 | Disposition: A | Discharge status: Home / Self Care | Payer: Medicaid Other | Source: Ambulatory Visit | Attending: Radiation Oncology | Admitting: Radiation Oncology

## 2021-01-20 ENCOUNTER — Other Ambulatory Visit: Payer: Self-pay

## 2021-01-20 ENCOUNTER — Other Ambulatory Visit (HOSPITAL_COMMUNITY): Payer: Self-pay

## 2021-01-20 ENCOUNTER — Telehealth: Payer: Self-pay

## 2021-01-20 ENCOUNTER — Inpatient Hospital Stay: Payer: Medicaid Other | Attending: Internal Medicine | Admitting: Internal Medicine

## 2021-01-20 ENCOUNTER — Telehealth: Payer: Self-pay | Admitting: Pharmacist

## 2021-01-20 ENCOUNTER — Encounter: Payer: Self-pay | Admitting: Internal Medicine

## 2021-01-20 DIAGNOSIS — D509 Iron deficiency anemia, unspecified: Secondary | ICD-10-CM | POA: Insufficient documentation

## 2021-01-20 DIAGNOSIS — C20 Malignant neoplasm of rectum: Secondary | ICD-10-CM | POA: Diagnosis not present

## 2021-01-20 DIAGNOSIS — E611 Iron deficiency: Secondary | ICD-10-CM | POA: Diagnosis not present

## 2021-01-20 DIAGNOSIS — Z510 Encounter for antineoplastic radiation therapy: Secondary | ICD-10-CM | POA: Insufficient documentation

## 2021-01-20 DIAGNOSIS — Z87891 Personal history of nicotine dependence: Secondary | ICD-10-CM | POA: Insufficient documentation

## 2021-01-20 DIAGNOSIS — F419 Anxiety disorder, unspecified: Secondary | ICD-10-CM | POA: Insufficient documentation

## 2021-01-20 LAB — COMPREHENSIVE METABOLIC PANEL
ALT: 13 U/L (ref 0–44)
AST: 18 U/L (ref 15–41)
Albumin: 4 g/dL (ref 3.5–5.0)
Alkaline Phosphatase: 103 U/L (ref 38–126)
Anion gap: 5 (ref 5–15)
BUN: 8 mg/dL (ref 8–23)
CO2: 29 mmol/L (ref 22–32)
Calcium: 8.9 mg/dL (ref 8.9–10.3)
Chloride: 105 mmol/L (ref 98–111)
Creatinine, Ser: 0.78 mg/dL (ref 0.61–1.24)
GFR, Estimated: >60 mL/min (ref >60)
Glucose, Bld: 97 mg/dL (ref 70–99)
Potassium: 3.7 mmol/L (ref 3.5–5.1)
Sodium: 139 mmol/L (ref 135–145)
Total Bilirubin: 0.3 mg/dL (ref 0.3–1.2)
Total Protein: 6.9 g/dL (ref 6.5–8.1)

## 2021-01-20 LAB — CBC WITH DIFFERENTIAL/PLATELET
Abs Immature Granulocytes: 0.06 10*3/uL (ref 0.00–0.07)
Basophils Absolute: 0.1 10*3/uL (ref 0.0–0.1)
Basophils Relative: 1 %
Eosinophils Absolute: 0.4 10*3/uL (ref 0.0–0.5)
Eosinophils Relative: 3 %
HCT: 33.3 % — ABNORMAL LOW (ref 39.0–52.0)
Hemoglobin: 11 g/dL — ABNORMAL LOW (ref 13.0–17.0)
Immature Granulocytes: 1 %
Lymphocytes Relative: 19 %
Lymphs Abs: 2.5 10*3/uL (ref 0.7–4.0)
MCH: 31.5 pg (ref 26.0–34.0)
MCHC: 33 g/dL (ref 30.0–36.0)
MCV: 95.4 fL (ref 80.0–100.0)
Monocytes Absolute: 0.7 10*3/uL (ref 0.1–1.0)
Monocytes Relative: 6 %
Neutro Abs: 9.1 10*3/uL — ABNORMAL HIGH (ref 1.7–7.7)
Neutrophils Relative %: 70 %
Platelets: 268 10*3/uL (ref 150–400)
RBC: 3.49 MIL/uL — ABNORMAL LOW (ref 4.22–5.81)
RDW: 12.7 % (ref 11.5–15.5)
WBC: 12.8 10*3/uL — ABNORMAL HIGH (ref 4.0–10.5)
nRBC: 0 % (ref 0.0–0.2)

## 2021-01-20 MED ORDER — DIAZEPAM 5 MG PO TABS: ORAL_TABLET | ORAL | 0 refills | Status: DC

## 2021-01-20 MED ORDER — CLONAZEPAM 0.5 MG PO TABS: 0.5000 mg | ORAL_TABLET | Freq: Two times a day (BID) | ORAL | 0 refills | Status: DC | PRN

## 2021-01-20 MED ORDER — CAPECITABINE 500 MG PO TABS
ORAL_TABLET | ORAL | 0 refills | Status: DC
  Filled 2021-01-20: qty 50, 14d supply, fill #0
  Filled 2021-02-04: qty 50, 14d supply, fill #1
  Filled 2021-02-14: qty 30, 8d supply, fill #2
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
## 2021-01-20 NOTE — Progress Notes (Signed)
Pt is very anxious and nervous for the visit.

## 2021-01-20 NOTE — Progress Notes (Signed)
I tried to reach the patient's son to discuss the plan of care. Unable to reach/unable to leave a voicemail.

## 2021-01-20 NOTE — Assessment & Plan Note (Addendum)
#   Rectal cancer- staging pending-however clinically suggestive of locally advanced disease; await MRI for rectal CA staging on 11/29; await PET scan for further evaluation given the lung nodule.  # Again reviewed the treatment options of locally advanced rectal cancer--surgery; chemotherapy and radiation.  Given the patient's  ongoing symptomatic rectal pain/bleeding -I would recommend chemoradiation followed chemotherapy/FOLFOX.  However patient's tolerance to concurrent chemoradiation-we will help me decide if patient would go on to FOLFOX chemotherapy prior to surgery.  Patient will need definitive surgery.   # I would recommend Xeloda-along with concurrent radiation; discussed with pharmacy-1500 mg in the morning and 1000 mg in the evening twice a day Monday through Friday.  Discussed regarding hand-foot syndrome-and also ways to mitigate/prevent.  Discussed with Dr. Donella Stade.  # Discussed the potential side effects including but not limited to-increasing fatigue, nausea vomiting, diarrhea, hair loss, sores in the mouth, increase risk of infection and also neuropathy. Discussed the radiation schedule-Monday through Friday [5 times a week]; possible radiation side effects including skin rash/fatigue; radiation-induced diarrhea.   # Anxiety: recommend Klonipin 0.5 mg BID; recommend Valium for MRI rectum.   #Iron deficient anemia hemoglobin 10-11-secondary to chronic GI bleed iron deficiency; plan Venofer next visit.  #Refer to surgery for port placement-  # DISPOSITION: # labs today- order cbc.cmp; cea # referral for port placement # chemo education re: xeloda/ FOLFOX chemo # follow up in week of Nov 7th- MD; labs- cbc/cmp; possible venofer--Dr.B  #I tried to reach patient's son, Tyler-unable to leave a voicemail.   # 40 minutes face-to-face with the patient discussing the above plan of care; more than 50% of time spent on prognosis/ natural history; counseling and coordination.

## 2021-01-20 NOTE — Telephone Encounter (Signed)
Oral Oncology Pharmacist Encounter  Received new prescription for Xeloda (capecitabine) for the treatment of locally advanced rectal cancer in conjunction with radiation, planned duration until the end of radiation.  BMP from 01/13/21 assessed, no relevant lab abnormalities. Prescription dose and frequency assessed.   Current medication list in Epic reviewed, no DDIs with capecitabine identified.  Evaluated chart and no patient barriers to medication adherence identified.   Prescription has been e-scribed to the [facility] for benefits analysis and approval.  Oral Oncology Clinic will continue to follow for insurance authorization, copayment issues, initial counseling and start date.   Darl Pikes, PharmD, BCPS, BCOP, CPP Hematology/Oncology Clinical Pharmacist Practitioner [facility]/AP [facility] [phone]  01/20/2021 1:52 PM

## 2021-01-20 NOTE — Telephone Encounter (Signed)
Spoke with [name] [name] is currently at work. She still has plans to take him to his MRI on Saturday. He was previously provided instructions for fleets enema. She is also planning on bringing him to his PET scan on 10/31. Went over instructions at his radiation appointment yesterday. I have asked her to accompany him inside for his radiation appointment because he will be meeting with the pharmacist regarding taking Xeloda. She will provide the drug and provide all education relating to it. His upcoming appointment with radiation was also provided yesterday. He has all appointments printed on AVS. All questions answered.

## 2021-01-21 ENCOUNTER — Encounter: Payer: Self-pay | Admitting: Internal Medicine

## 2021-01-21 ENCOUNTER — Other Ambulatory Visit (HOSPITAL_COMMUNITY): Payer: Self-pay

## 2021-01-21 ENCOUNTER — Ambulatory Visit
Admission: RE | Admit: 2021-01-21 | Discharge: 2021-01-21 | Disposition: A | Discharge status: Home / Self Care | Payer: Medicaid Other | Source: Ambulatory Visit | Attending: Radiation Oncology | Admitting: Radiation Oncology

## 2021-01-21 DIAGNOSIS — E611 Iron deficiency: Secondary | ICD-10-CM | POA: Insufficient documentation

## 2021-01-21 DIAGNOSIS — Z510 Encounter for antineoplastic radiation therapy: Secondary | ICD-10-CM | POA: Diagnosis not present

## 2021-01-21 LAB — CEA: CEA: 11.5 ng/mL — ABNORMAL HIGH (ref 0.0–4.7)

## 2021-01-22 ENCOUNTER — Ambulatory Visit
Admission: RE | Admit: 2021-01-22 | Discharge: 2021-01-22 | Disposition: A | Discharge status: Home / Self Care | Payer: Medicaid Other | Source: Ambulatory Visit | Attending: Internal Medicine | Admitting: Internal Medicine

## 2021-01-22 DIAGNOSIS — C20 Malignant neoplasm of rectum: Secondary | ICD-10-CM | POA: Insufficient documentation

## 2021-01-22 IMAGING — MR MR PELVIS W/O CM
8 series · 48 of 48 positions shown · non-contrast
Comparison: PET exam of the same date, reported separately and CT
of the chest, abdomen and pelvis [DATE].

CLINICAL DATA: Colorectal cancer staging in a 60-year-old male
found to have a rectal neoplasm on colonoscopy

EXAM:
MRI PELVIS WITHOUT CONTRAST
TECHNIQUE: Multiplanar multisequence MR imaging of the pelvis was performed. No
intravenous contrast was administered. Ultrasound gel was
administered per rectum to optimize tumor evaluation.

[Series 2: T2 · sagittal · 3.0mm · 0.94mm/px · 5 of 35 slices shown (1 of 5)]
[im 1/35]
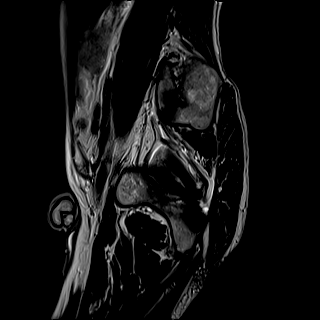
[im 9/35]
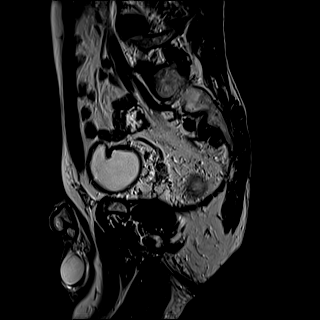
[im 18/35]
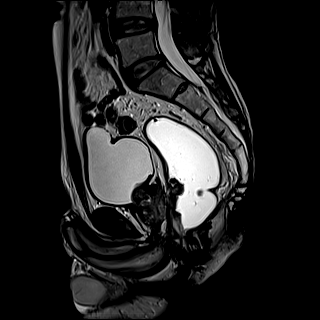
[im 26/35]
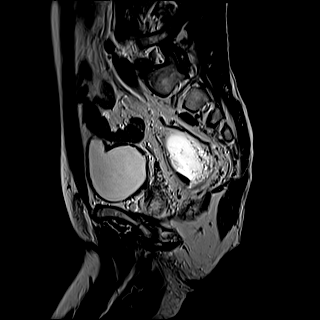
[im 35/35]
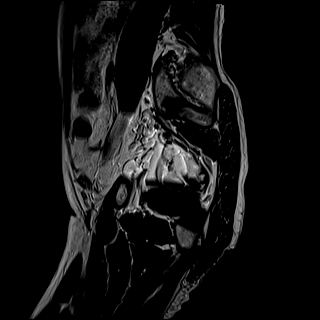

[Series 3: T2 · coronal · 3.0mm · 0.56mm/px · 6 of 45 slices shown (2 of 5)]
[im 1/45]
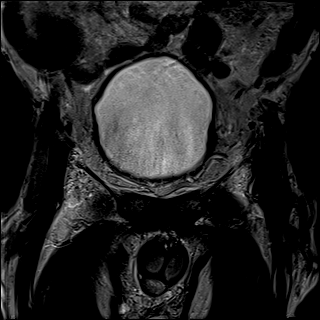
[im 9/45]
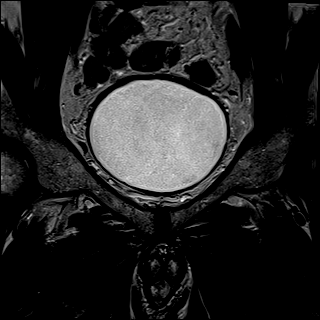
[im 18/45]
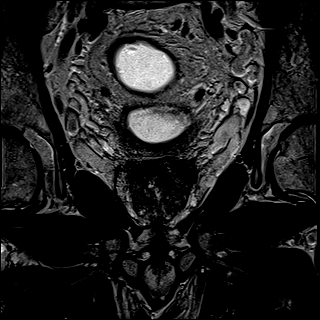
[im 27/45]
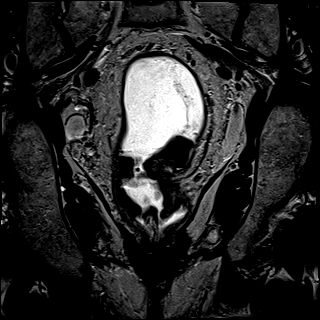
[im 36/45]
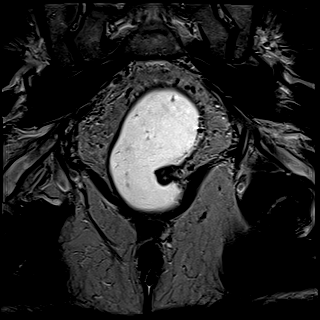
[im 45/45]
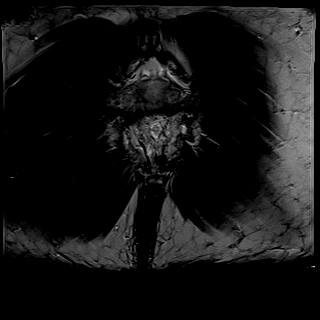

[Series 4: T2 · axial · 5.0mm · 1.19mm/px · z∈[-160,+68]mm · 6 of 39 slices shown (3 of 5)]
[im 1/39]
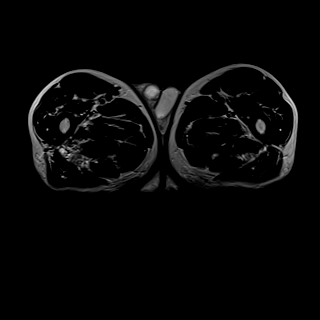
[im 8/39]
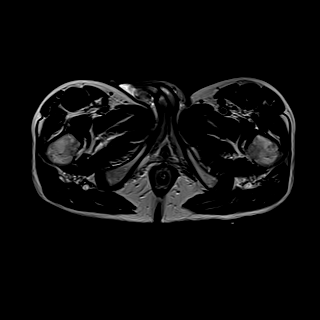
[im 16/39]
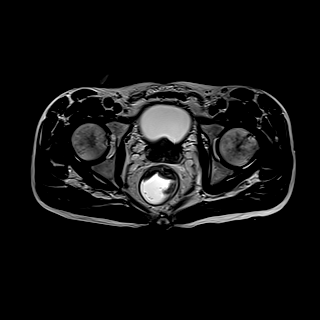
[im 23/39]
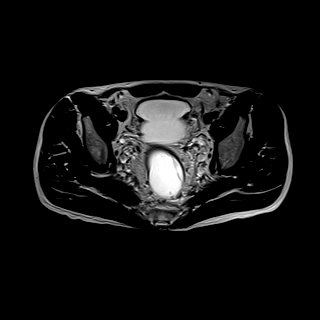
[im 31/39]
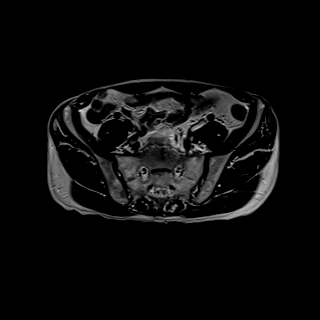
[im 39/39]
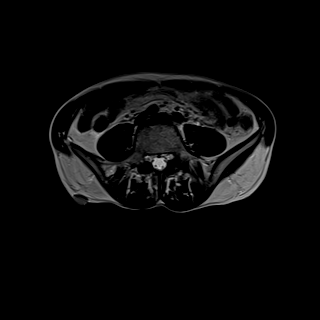

[Series 5: ax dwi_tracew · axial · 3.0mm · 1.48mm/px · z∈[-116,-44]mm · 11 of 75 slices shown]
[im 1/75]
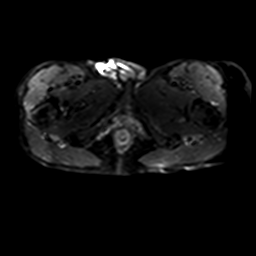
[im 8/75]
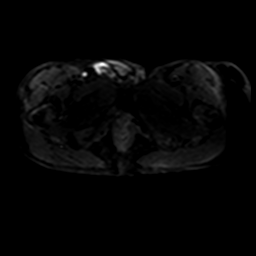
[im 15/75]
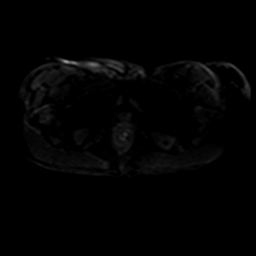
[im 23/75]
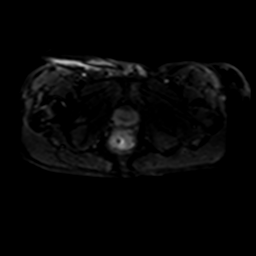
[im 30/75]
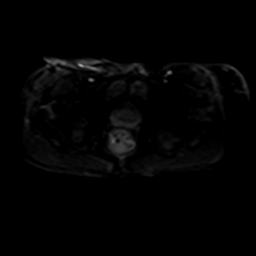
[im 38/75]
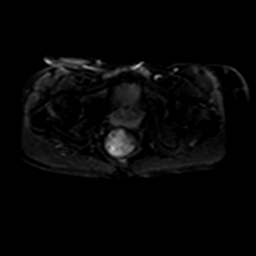
[im 45/75]
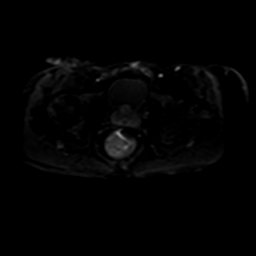
[im 52/75]
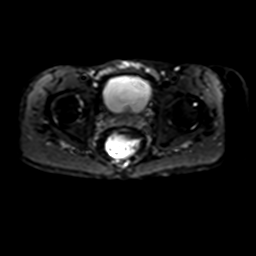
[im 60/75]
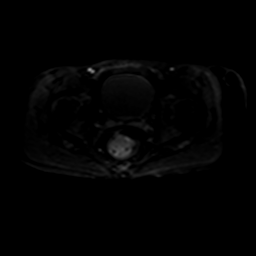
[im 67/75]
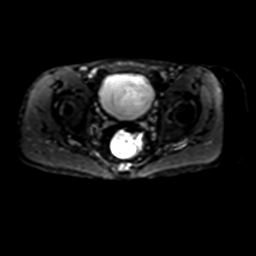
[im 75/75]
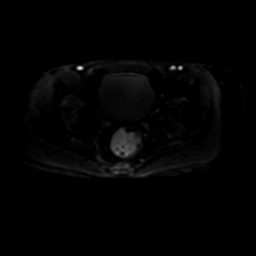

[Series 6: ax dwi_adc · axial · 3.0mm · 1.48mm/px · z∈[-116,-44]mm · 4 of 25 slices shown]
[im 1/25]
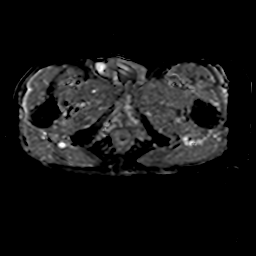
[im 9/25]
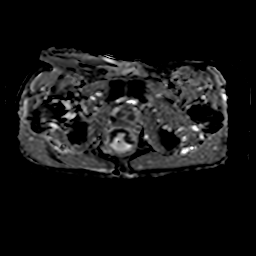
[im 17/25]
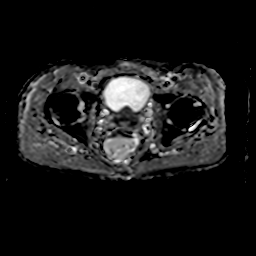
[im 25/25]
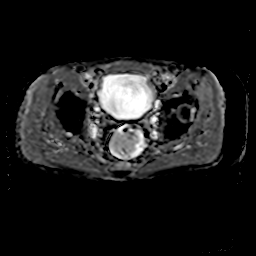

[Series 7: ax dwi_calc_bval · axial · 3.0mm · 1.48mm/px · z∈[-116,-44]mm · 4 of 25 slices shown]
[im 1/25]
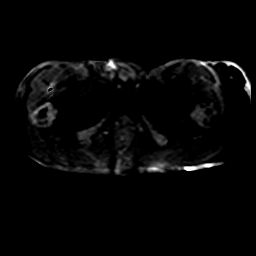
[im 9/25]
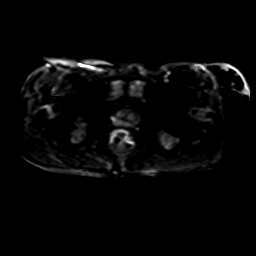
[im 17/25]
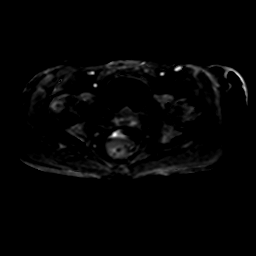
[im 25/25]
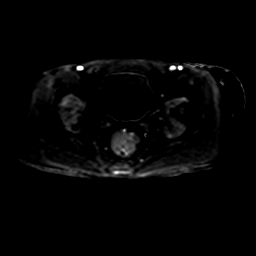

[Series 8: T2 · axial · 3.0mm · 0.56mm/px · z∈[-146,-14]mm · 6 of 45 slices shown (4 of 5)]
[im 1/45]
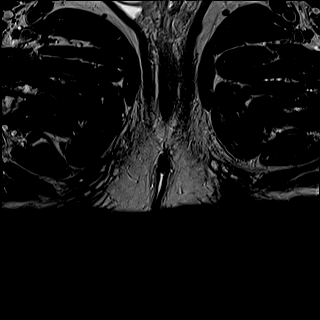
[im 9/45]
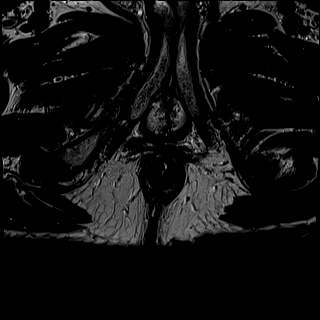
[im 18/45]
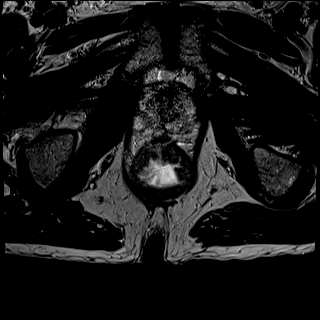
[im 27/45]
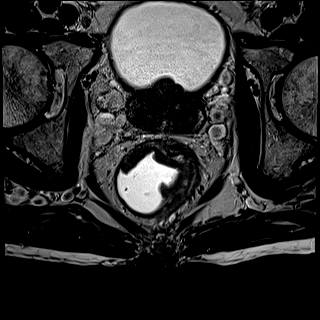
[im 36/45]
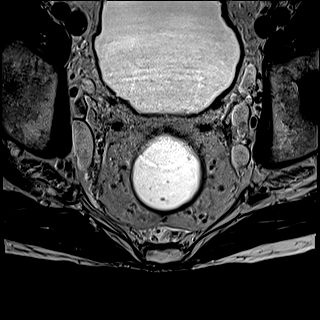
[im 45/45]
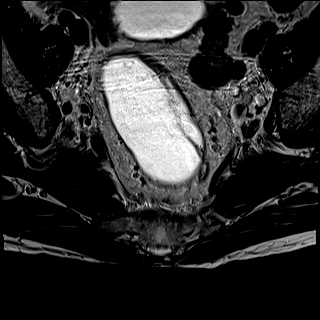

[Series 9: T2 · coronal · 3.0mm · 0.56mm/px · 6 of 45 slices shown (5 of 5)]
[im 1/45]
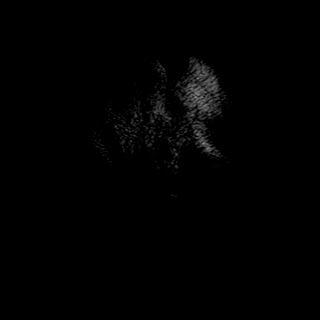
[im 9/45]
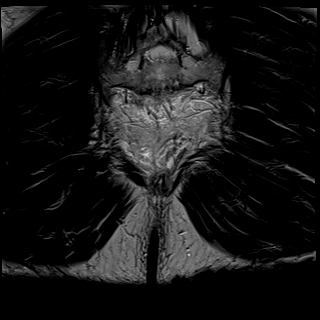
[im 18/45]
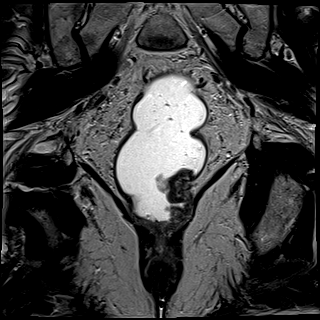
[im 27/45]
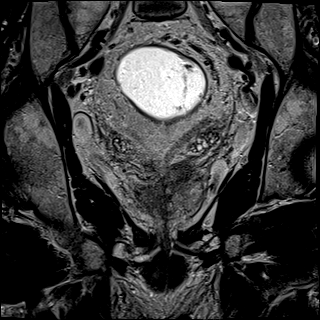
[im 36/45]
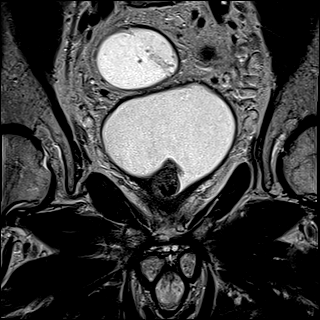
[im 45/45]
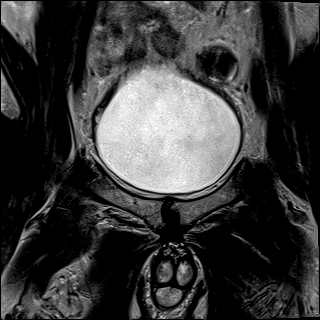

[48 of 48 positions shown; findings below may reference images not displayed]

FINDINGS: TUMOR LOCATION

Tumor distance from Anal Verge/Skin Surface:  4.5 cm cm

Tumor distance to Internal Anal Sphincter: 1 cm cm

TUMOR DESCRIPTION

Circumferential Extent: Involving slightly greater than 180 degrees
of the rectum along the anterior and LEFT lateral margin more so
than the RIGHT, 180 degree involvement above the sphincter complex
at the lowest extent of the tumor.

Tumor Length: Approximately 5 cm

T - CATEGORY

Extension through Muscularis Propria: Yes 1-3 mm extension beyond
the rectal wall (image [DATE]) this abuts the mesorectal fascia and
the rectoprostatic fascia as well as the posterior aspect of the
prostate gland at this level.

Shortest Distance of any tumor/node from Mesorectal Fascia: 0 mm

Extramural Vascular Invasion/Tumor Thrombus: No

Invasion of Anterior Peritoneal Reflection: No

Involvement of Adjacent Organs or Pelvic Sidewall: Suspicious for
early involvement of the posterior prostate on image [DATE])

Levator Ani Involvement: No

N - CATEGORY

Mesorectal Lymph Nodes >=5mm: Multiple lymph nodes greater than 5 mm
(image [DATE]) 5 mm LEFT mesorectal lymph node.

(Image [DATE]) 7 mm LEFT mesorectal lymph node.

(Image [DATE]) 5 mm RIGHT mesorectal lymph node.

(Image [DATE]) 5 mm LEFT anterolateral mesorectal lymph node.

Extra-mesorectal Lymphadenopathy: None

Other:  Heterogeneous prostate, nonspecific.
IMPRESSION: Findings suspicious for T4b disease with early involvement of the
prostate. Tumor extends anteriorly and abuts the mesorectal fascia
and the posterior prostate as outlined above. Signs of N2 disease in
the pelvis.

Heterogeneity of the prostate with dark area on ADC in the RIGHT
posterior gland which is nonspecific given that this is not a
prostate protocol. Would however consider PSA correlation with
digital rectal examination and further imaging of the prostate as
warranted.

## 2021-01-24 ENCOUNTER — Ambulatory Visit
Admission: RE | Admit: 2021-01-24 | Discharge: 2021-01-24 | Disposition: A | Discharge status: Home / Self Care | Payer: Medicaid Other | Source: Ambulatory Visit | Attending: Radiation Oncology | Admitting: Radiation Oncology

## 2021-01-24 ENCOUNTER — Other Ambulatory Visit: Payer: Self-pay

## 2021-01-24 ENCOUNTER — Ambulatory Visit
Admission: RE | Admit: 2021-01-24 | Discharge: 2021-01-24 | Disposition: A | Discharge status: Home / Self Care | Payer: Medicaid Other | Source: Ambulatory Visit | Attending: Internal Medicine | Admitting: Internal Medicine

## 2021-01-24 ENCOUNTER — Inpatient Hospital Stay: Payer: Medicaid Other | Admitting: Pharmacist

## 2021-01-24 DIAGNOSIS — I2510 Atherosclerotic heart disease of native coronary artery without angina pectoris: Secondary | ICD-10-CM | POA: Diagnosis not present

## 2021-01-24 DIAGNOSIS — Z510 Encounter for antineoplastic radiation therapy: Secondary | ICD-10-CM | POA: Diagnosis not present

## 2021-01-24 DIAGNOSIS — C20 Malignant neoplasm of rectum: Secondary | ICD-10-CM | POA: Diagnosis not present

## 2021-01-24 DIAGNOSIS — J439 Emphysema, unspecified: Secondary | ICD-10-CM | POA: Insufficient documentation

## 2021-01-24 DIAGNOSIS — R911 Solitary pulmonary nodule: Secondary | ICD-10-CM | POA: Insufficient documentation

## 2021-01-24 LAB — GLUCOSE, CAPILLARY: Glucose-Capillary: 68 mg/dL — ABNORMAL LOW (ref 70–99)
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
## 2021-01-24 MED ORDER — FLUDEOXYGLUCOSE F - 18 (FDG) INJECTION
6.3000 | Freq: Once | INTRAVENOUS | Status: AC | PRN
  Administered 2021-01-24: 6.3 via INTRAVENOUS
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
## 2021-01-25 ENCOUNTER — Other Ambulatory Visit: Payer: Self-pay

## 2021-01-25 ENCOUNTER — Inpatient Hospital Stay: Payer: Medicaid Other

## 2021-01-25 ENCOUNTER — Ambulatory Visit
Admission: RE | Admit: 2021-01-25 | Discharge: 2021-01-25 | Disposition: A | Discharge status: Home / Self Care | Payer: Medicaid Other | Source: Ambulatory Visit | Attending: Radiation Oncology | Admitting: Radiation Oncology

## 2021-01-25 ENCOUNTER — Other Ambulatory Visit: Payer: Self-pay | Admitting: Internal Medicine

## 2021-01-25 DIAGNOSIS — C20 Malignant neoplasm of rectum: Secondary | ICD-10-CM | POA: Insufficient documentation

## 2021-01-25 DIAGNOSIS — D649 Anemia, unspecified: Secondary | ICD-10-CM | POA: Diagnosis not present

## 2021-01-25 DIAGNOSIS — R197 Diarrhea, unspecified: Secondary | ICD-10-CM | POA: Diagnosis present

## 2021-01-25 DIAGNOSIS — Z95828 Presence of other vascular implants and grafts: Secondary | ICD-10-CM | POA: Insufficient documentation

## 2021-01-25 MED ORDER — TAMSULOSIN HCL 0.4 MG PO CAPS: 0.4000 mg | ORAL_CAPSULE | Freq: Every day | ORAL | 6 refills | Status: DC

## 2021-01-26 ENCOUNTER — Ambulatory Visit
Admission: RE | Admit: 2021-01-26 | Discharge: 2021-01-26 | Disposition: A | Discharge status: Home / Self Care | Payer: Medicaid Other | Source: Ambulatory Visit | Attending: Radiation Oncology | Admitting: Radiation Oncology

## 2021-01-26 ENCOUNTER — Inpatient Hospital Stay: Payer: Medicaid Other

## 2021-01-26 DIAGNOSIS — R197 Diarrhea, unspecified: Secondary | ICD-10-CM | POA: Insufficient documentation

## 2021-01-26 DIAGNOSIS — F419 Anxiety disorder, unspecified: Secondary | ICD-10-CM | POA: Insufficient documentation

## 2021-01-26 DIAGNOSIS — R911 Solitary pulmonary nodule: Secondary | ICD-10-CM | POA: Insufficient documentation

## 2021-01-26 DIAGNOSIS — D509 Iron deficiency anemia, unspecified: Secondary | ICD-10-CM | POA: Insufficient documentation

## 2021-01-26 DIAGNOSIS — Z87891 Personal history of nicotine dependence: Secondary | ICD-10-CM | POA: Insufficient documentation

## 2021-01-26 DIAGNOSIS — Z79899 Other long term (current) drug therapy: Secondary | ICD-10-CM | POA: Insufficient documentation

## 2021-01-26 DIAGNOSIS — C20 Malignant neoplasm of rectum: Secondary | ICD-10-CM | POA: Insufficient documentation

## 2021-01-27 ENCOUNTER — Encounter: Payer: Self-pay | Admitting: Surgery

## 2021-01-27 ENCOUNTER — Other Ambulatory Visit: Payer: Self-pay | Admitting: *Deleted

## 2021-01-27 ENCOUNTER — Inpatient Hospital Stay: Payer: Medicaid Other

## 2021-01-27 ENCOUNTER — Ambulatory Visit (INDEPENDENT_AMBULATORY_CARE_PROVIDER_SITE_OTHER): Payer: Medicaid Other | Admitting: Surgery

## 2021-01-27 ENCOUNTER — Ambulatory Visit
Admission: RE | Admit: 2021-01-27 | Discharge: 2021-01-27 | Disposition: A | Discharge status: Home / Self Care | Payer: Medicaid Other | Source: Ambulatory Visit | Attending: Radiation Oncology | Admitting: Radiation Oncology

## 2021-01-27 ENCOUNTER — Other Ambulatory Visit: Payer: Self-pay

## 2021-01-27 VITALS — BP 113/77 | HR 64 | Temp 97.7°F | Ht 66.0 in | Wt 125.2 lb

## 2021-01-27 DIAGNOSIS — R110 Nausea: Secondary | ICD-10-CM

## 2021-01-27 DIAGNOSIS — T451X5A Adverse effect of antineoplastic and immunosuppressive drugs, initial encounter: Secondary | ICD-10-CM

## 2021-01-27 DIAGNOSIS — C20 Malignant neoplasm of rectum: Secondary | ICD-10-CM | POA: Diagnosis not present

## 2021-01-27 DIAGNOSIS — Z452 Encounter for adjustment and management of vascular access device: Secondary | ICD-10-CM

## 2021-01-27 MED ORDER — PROCHLORPERAZINE MALEATE 10 MG PO TABS: 10.0000 mg | ORAL_TABLET | Freq: Four times a day (QID) | ORAL | 3 refills | Status: DC | PRN

## 2021-01-27 MED ORDER — ONDANSETRON HCL 8 MG PO TABS
8.0000 mg | ORAL_TABLET | Freq: Three times a day (TID) | ORAL | 3 refills | Status: DC | PRN
Start: 2021-01-27 — End: 2022-04-18

## 2021-01-27 MED ORDER — LIDOCAINE-PRILOCAINE 2.5-2.5 % EX CREA
1.0000 | TOPICAL_CREAM | CUTANEOUS | 0 refills | Status: DC | PRN
Start: 2021-01-27 — End: 2022-04-18
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
## 2021-01-27 NOTE — Patient Instructions (Addendum)
Our surgery scheduler will call you to schedule your surgery within 24-48 hours. Please see your Blue surgery sheet available when speaking with her.  Implanted Port Insertion Implanted port insertion is a procedure to put in a port and catheter. The port is a device with an injectable disk that can be accessed by your health care provider. The port is connected to a vein in the chest or neck by a small flexible tube (catheter). There are different types of ports. The implanted port may be used as a long-term IV access for: Medicines, such as chemotherapy. Fluids. Liquid nutrition, such as total parenteral nutrition (TPN). When you have a port, your health care provider can choose to use the port instead of veins in your arms for these procedures. Tell a health care provider about: Any allergies you have. All medicines you are taking, especially blood thinners, as well as any vitamins, herbs, eye drops, creams, over-the-counter medicines, and steroids. Any problems you or family members have had with anesthetic medicines. Any blood disorders you have. Any surgeries you have had. Any medical conditions you have or have had, including diabetes or kidney problems. Whether you are pregnant or may be pregnant. What are the risks? Generally, this is a safe procedure. However, problems may occur, including: Allergic reactions to medicines or dyes. Damage to other structures or organs. Infection. Damage to the blood vessel, bruising, or bleeding at the puncture site. Blood clot. Breakdown of the skin over the port. A collection of air in the chest that can cause one of the lungs to collapse (pneumothorax). This is rare. What happens before the procedure? Staying hydrated Follow instructions from your health care provider about hydration, which may include: Up to 2 hours before the procedure - you may continue to drink clear liquids, such as water, clear fruit juice, black coffee, and plain  tea.  Eating and drinking restrictions Follow instructions from your health care provider about eating and drinking, which may include: 8 hours before the procedure - stop eating heavy meals or foods, such as meat, fried foods, or fatty foods. 6 hours before the procedure - stop eating light meals or foods, such as toast or cereal. 6 hours before the procedure - stop drinking milk or drinks that contain milk. 2 hours before the procedure - stop drinking clear liquids. Medicines Ask your health care provider about: Changing or stopping your regular medicines. This is especially important if you are taking diabetes medicines or blood thinners. Taking medicines such as aspirin and ibuprofen. These medicines can thin your blood. Do not take these medicines unless your health care provider tells you to take them. Taking over-the-counter medicines, vitamins, herbs, and supplements. General instructions Plan to have someone take you home from the hospital or clinic. If you will be going home right after the procedure, plan to have someone with you for 24 hours. You may have blood tests. Do not use any products that contain nicotine or tobacco for at least 4-6 weeks before the procedure. These products include cigarettes, e-cigarettes, and chewing tobacco. If you need help quitting, ask your health care provider. Ask your health care provider what steps will be taken to help prevent infection. These may include: Removing hair at the surgery site. Washing skin with a germ-killing soap. Taking antibiotic medicine. What happens during the procedure?  An IV will be inserted into one of your veins. You will be given one or more of the following: A medicine to help you relax (sedative).  A medicine to numb the area (local anesthetic). Two small incisions will be made to insert the port. One smaller incision will be made in your neck to get access to the vein where the catheter will lie. The other  incision will be made in the upper chest. This is where the port will lie. The procedure may be done using continuous X-ray (fluoroscopy) or other imaging tools for guidance. The port and catheter will be placed. There may be a small, raised area where the port is. The port will be flushed with a salt solution (saline), and blood will be drawn to make sure that it is working correctly. The incisions will be closed. Bandages (dressings) may be placed over the incisions. The procedure may vary among health care providers and hospitals. What happens after the procedure? Your blood pressure, heart rate, breathing rate, and blood oxygen level will be monitored until you leave the hospital or clinic. Do not drive for 24 hours if you were given a sedative during your procedure. You will be given a manufacturer's information card for the type of port that you have. Keep this with you. Your port will need to be flushed and checked as told by your health care provider, usually every few weeks. A chest X-ray will be done to: Check the placement of the port. Make sure there is no injury to your lung. Summary Implanted port insertion is a procedure to put in a port and catheter. The implanted port is used as a long-term IV access. The port will need to be flushed and checked as told by your health care provider, usually every few weeks. Keep your manufacturer's information card with you at all times. This information is not intended to replace advice given to you by your health care provider. Make sure you discuss any questions you have with your health care provider. Document Revised: 06/02/2020 Document Reviewed: 10/09/2017 Elsevier Patient Education  [place].

## 2021-01-28 ENCOUNTER — Inpatient Hospital Stay: Payer: Medicaid Other

## 2021-01-28 ENCOUNTER — Telehealth: Payer: Self-pay | Admitting: Surgery

## 2021-01-28 ENCOUNTER — Ambulatory Visit
Admission: RE | Admit: 2021-01-28 | Discharge: 2021-01-28 | Disposition: A | Discharge status: Home / Self Care | Payer: Medicaid Other | Source: Ambulatory Visit | Attending: Radiation Oncology | Admitting: Radiation Oncology

## 2021-01-28 DIAGNOSIS — C20 Malignant neoplasm of rectum: Secondary | ICD-10-CM | POA: Diagnosis not present

## 2021-01-28 NOTE — Telephone Encounter (Signed)
Patient has been advised of Pre-Admission date/time, COVID Testing date and Surgery date.  Surgery Date: 02/02/21 Preadmission Testing Date: 01/31/21 (phone 1p-5p) Covid Testing Date: Not needed.     Patient has been made aware to call [phone], between 1-3:00pm the day before surgery, to find out what time to arrive for surgery.   Patient seemed to verbalize understanding somewhat, he was confused.  He is asked to call the office any time he needs for assistance with helping him to understand his appointments.  Patient wished well.

## 2021-01-31 ENCOUNTER — Other Ambulatory Visit (HOSPITAL_COMMUNITY): Payer: Self-pay

## 2021-01-31 ENCOUNTER — Other Ambulatory Visit
Admission: RE | Admit: 2021-01-31 | Discharge: 2021-01-31 | Disposition: A | Discharge status: Home / Self Care | Payer: Medicaid Other | Source: Ambulatory Visit | Attending: Surgery | Admitting: Surgery

## 2021-01-31 ENCOUNTER — Inpatient Hospital Stay: Payer: Medicaid Other

## 2021-01-31 ENCOUNTER — Ambulatory Visit: Payer: Self-pay | Admitting: Surgery

## 2021-01-31 ENCOUNTER — Ambulatory Visit
Admission: RE | Admit: 2021-01-31 | Discharge: 2021-01-31 | Disposition: A | Discharge status: Home / Self Care | Payer: Medicaid Other | Source: Ambulatory Visit | Attending: Radiation Oncology | Admitting: Radiation Oncology

## 2021-01-31 ENCOUNTER — Other Ambulatory Visit: Payer: Self-pay

## 2021-01-31 DIAGNOSIS — C20 Malignant neoplasm of rectum: Secondary | ICD-10-CM

## 2021-01-31 NOTE — Patient Instructions (Addendum)
Your procedure is scheduled on: Wednesday February 02, 2021. Report to Day Surgery inside [facility] 2nd floor stop.  To find out your arrival time please call [phone] between 1PM - 3PM on Tuesday February 01, 2021.  Remember: Instructions that are not followed completely may result in serious medical risk,  up to and including death, or upon the discretion of your surgeon and anesthesiologist your  surgery may need to be rescheduled.     _X__ 1. Do not eat food or drink fluids after midnight the night before your procedure.                 No chewing gum or hard candies.   __X__2.  On the morning of surgery brush your teeth with toothpaste and water, you                may rinse your mouth with mouthwash if you wish.  Do not swallow any toothpaste of mouthwash.     _X__ 3.  No Alcohol for 24 hours before or after surgery.   _X__ 4.  Do Not Smoke or use e-cigarettes For 24 Hours Prior to Your Surgery.                 Do not use any chewable tobacco products for at least 6 hours prior to                 Surgery.  _X__  5.  Do not use any recreational drugs (marijuana, cocaine, heroin, ecstasy, MDMA or other)                For at least one week prior to your surgery.  Combination of these drugs with anesthesia                May have life threatening results.  __X__  6  Notify your doctor if there is any change in your medical condition      (cold, fever, infections).     Do not wear jewelry, make-up, hairpins, clips or nail polish. Do not wear lotions, powders, or perfumes. You may wear deodorant. Do not shave 48 hours prior to surgery. Men may shave face and neck. Do not bring valuables to the hospital.    [facility] is not responsible for any belongings or valuables.  Contacts, dentures or bridgework may not be worn into surgery. Leave your suitcase in the car. After surgery it may be brought to your room. For patients admitted to the hospital,  discharge time is determined by your treatment team.   Patients discharged the day of surgery will not be allowed to drive home.   Make arrangements for someone to be with you for the first 24 hours of your Same Day Discharge.   __X__ Take these medicines the morning of surgery with A SIP OF WATER:    1. capecitabine (XELODA) 500 MG  2. clonazePAM (KLONOPIN) 0.5   3. pantoprazole (PROTONIX) 40 MG    4.  5.  6.  ____ Fleet Enema (as directed)   _X___ Use Antibacterial Soap   as directed  ____ Use Benzoyl Peroxide Gel as instructed  ____ Use inhalers on the day of surgery  ____ Stop metformin 2 days prior to surgery    ____ Take 1/2 of usual insulin dose the night before surgery. No insulin the morning          of surgery.   ____ Call your PCP, cardiologist, or Pulmonologist  if taking Coumadin/Plavix/aspirin and ask when to stop before your surgery.   __X__ One Week prior to surgery- Stop Anti-inflammatories such as Ibuprofen, Aleve, Advil, Motrin, meloxicam (MOBIC), diclofenac, etodolac, ketorolac, Toradol, Daypro, piroxicam, Goody's or BC powders. OK TO USE TYLENOL IF NEEDED   __X__ Stop supplements until after surgery.    ____ Bring C-Pap to the hospital.    If you have any questions regarding your pre-procedure instructions,  Please call Pre-admit Testing at [phone].

## 2021-02-01 ENCOUNTER — Inpatient Hospital Stay: Payer: Medicaid Other

## 2021-02-01 ENCOUNTER — Ambulatory Visit
Admission: RE | Admit: 2021-02-01 | Discharge: 2021-02-01 | Disposition: A | Discharge status: Home / Self Care | Payer: Medicaid Other | Source: Ambulatory Visit | Attending: Radiation Oncology | Admitting: Radiation Oncology

## 2021-02-01 ENCOUNTER — Inpatient Hospital Stay (HOSPITAL_BASED_OUTPATIENT_CLINIC_OR_DEPARTMENT_OTHER): Payer: Medicaid Other | Admitting: Internal Medicine

## 2021-02-01 VITALS — BP 115/75 | HR 69 | Temp 96.0°F | Resp 20 | Ht 67.0 in | Wt 126.0 lb

## 2021-02-01 DIAGNOSIS — C20 Malignant neoplasm of rectum: Secondary | ICD-10-CM

## 2021-02-01 DIAGNOSIS — D649 Anemia, unspecified: Secondary | ICD-10-CM

## 2021-02-01 LAB — CBC WITH DIFFERENTIAL/PLATELET
Abs Immature Granulocytes: 0.02 10*3/uL (ref 0.00–0.07)
Basophils Absolute: 0 10*3/uL (ref 0.0–0.1)
Basophils Relative: 1 %
Eosinophils Absolute: 0.4 10*3/uL (ref 0.0–0.5)
Eosinophils Relative: 7 %
HCT: 32.3 % — ABNORMAL LOW (ref 39.0–52.0)
Hemoglobin: 10.9 g/dL — ABNORMAL LOW (ref 13.0–17.0)
Immature Granulocytes: 0 %
Lymphocytes Relative: 18 %
Lymphs Abs: 1 10*3/uL (ref 0.7–4.0)
MCH: 31.4 pg (ref 26.0–34.0)
MCHC: 33.7 g/dL (ref 30.0–36.0)
MCV: 93.1 fL (ref 80.0–100.0)
Monocytes Absolute: 0.4 10*3/uL (ref 0.1–1.0)
Monocytes Relative: 7 %
Neutro Abs: 3.7 10*3/uL (ref 1.7–7.7)
Neutrophils Relative %: 67 %
Platelets: 253 10*3/uL (ref 150–400)
RBC: 3.47 MIL/uL — ABNORMAL LOW (ref 4.22–5.81)
RDW: 12.3 % (ref 11.5–15.5)
WBC: 5.4 10*3/uL (ref 4.0–10.5)
nRBC: 0 % (ref 0.0–0.2)

## 2021-02-01 LAB — COMPREHENSIVE METABOLIC PANEL
ALT: 12 U/L (ref 0–44)
AST: 17 U/L (ref 15–41)
Albumin: 4.3 g/dL (ref 3.5–5.0)
Alkaline Phosphatase: 92 U/L (ref 38–126)
Anion gap: 8 (ref 5–15)
BUN: 11 mg/dL (ref 8–23)
CO2: 26 mmol/L (ref 22–32)
Calcium: 9 mg/dL (ref 8.9–10.3)
Chloride: 103 mmol/L (ref 98–111)
Creatinine, Ser: 0.84 mg/dL (ref 0.61–1.24)
GFR, Estimated: >60 mL/min (ref >60)
Glucose, Bld: 100 mg/dL — ABNORMAL HIGH (ref 70–99)
Potassium: 3.4 mmol/L — ABNORMAL LOW (ref 3.5–5.1)
Sodium: 137 mmol/L (ref 135–145)
Total Bilirubin: 0.6 mg/dL (ref 0.3–1.2)
Total Protein: 7 g/dL (ref 6.5–8.1)

## 2021-02-01 MED ORDER — CEFAZOLIN SODIUM-DEXTROSE 2-4 GM/100ML-% IV SOLN
2.0000 g | INTRAVENOUS | Status: AC
  Administered 2021-02-02: 2 g via INTRAVENOUS

## 2021-02-01 MED ORDER — CLONAZEPAM 1 MG PO TABS: 1.0000 mg | ORAL_TABLET | Freq: Two times a day (BID) | ORAL | 0 refills | Status: DC | PRN

## 2021-02-01 MED ORDER — GABAPENTIN 300 MG PO CAPS: 300.0000 mg | ORAL_CAPSULE | ORAL | Status: AC

## 2021-02-01 MED ORDER — CHLORHEXIDINE GLUCONATE 0.12 % MT SOLN: 15.0000 mL | Freq: Once | OROMUCOSAL | Status: AC

## 2021-02-01 MED ORDER — CHLORHEXIDINE GLUCONATE CLOTH 2 % EX PADS: 6.0000 | MEDICATED_PAD | Freq: Once | CUTANEOUS | Status: DC

## 2021-02-01 MED ORDER — ORAL CARE MOUTH RINSE: 15.0000 mL | Freq: Once | OROMUCOSAL | Status: AC

## 2021-02-01 MED ORDER — ACETAMINOPHEN 500 MG PO TABS: 1000.0000 mg | ORAL_TABLET | ORAL | Status: AC

## 2021-02-01 MED ORDER — CELECOXIB 200 MG PO CAPS: 200.0000 mg | ORAL_CAPSULE | ORAL | Status: AC

## 2021-02-01 MED ORDER — LACTATED RINGERS IV SOLN: INTRAVENOUS | Status: DC

## 2021-02-01 NOTE — Progress Notes (Signed)
Patient states that he taking the klonopin 0.5mg  2 tablets twice a day rather than one tablet twice a day. He wants to know can he have a stronger pill (1mg ) so that he can only take one tablet twice a day. He also states that he has a had diarrhea (he feels like every 30 mins) and he started imodium yesterday and he only had to get up twice last night to use the restroom. He states that he doesn't sleep well.
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
## 2021-02-01 NOTE — Assessment & Plan Note (Addendum)
#   Rectal cancer-  locally advanced disease. MRI- OCT 2022- T4bN2; ? M [see below].  Proceed with concurrent chemoradiation.   #Patient currently on Xeloda-radiation [until DEC 7th, 6067].  Tolerating fairly well without any major side effects.  Discussed with pharmacy-1500 mg in the morning and 1000 mg in the evening twice a day Monday through Friday.  NO handfoot syndrome  #Left lower lobe lung nodule [measuring 11 x 8 mm]with increased metabolic activity- ? LEFT lower lobe nodule with increased metabolic activity suspicious for either metastatic colorectal neoplasm or second primary.  Will reevaluate with imaging after chemoradiation is done.  # Anxiety: poorly controlled; increase the Klonipin to 1 mg BID.  #Iron deficient anemia hemoglobin 10-11-secondary to chronic GI bleed iron deficiency;HOLD off venofer-as hemoglobin is stable.  # DISPOSITION: # HOLD venofer today # follow up NP in 2 weeks; cbc/bmp;PSA #  Follow up in 4 weeks- MD; labs- cbc/cmp; possible venofer--Dr.B  # I reviewed the blood work- with the patient in detail; also reviewed the imaging independently [as summarized above]; and with the patient in detail.  '

## 2021-02-02 ENCOUNTER — Encounter: Payer: Self-pay | Admitting: Surgery

## 2021-02-02 ENCOUNTER — Ambulatory Visit
Admission: RE | Admit: 2021-02-02 | Discharge: 2021-02-02 | Disposition: A | Discharge status: Home / Self Care | Payer: Medicaid Other | Source: Ambulatory Visit | Attending: Radiation Oncology | Admitting: Radiation Oncology

## 2021-02-02 ENCOUNTER — Ambulatory Visit: Payer: Medicaid Other | Admitting: Certified Registered"

## 2021-02-02 ENCOUNTER — Ambulatory Visit: Payer: Medicaid Other

## 2021-02-02 ENCOUNTER — Inpatient Hospital Stay: Payer: Medicaid Other | Admitting: Internal Medicine

## 2021-02-02 ENCOUNTER — Other Ambulatory Visit: Payer: Self-pay

## 2021-02-02 ENCOUNTER — Inpatient Hospital Stay: Payer: Medicaid Other

## 2021-02-02 ENCOUNTER — Encounter
Admission: RE | Disposition: A | Discharge status: Home / Self Care | Payer: Self-pay | Source: Home / Self Care | Attending: Surgery

## 2021-02-02 ENCOUNTER — Ambulatory Visit
Admission: RE | Admit: 2021-02-02 | Discharge: 2021-02-02 | Disposition: A | Discharge status: Home / Self Care | Payer: Medicaid Other | Attending: Surgery | Admitting: Surgery

## 2021-02-02 DIAGNOSIS — C20 Malignant neoplasm of rectum: Secondary | ICD-10-CM | POA: Diagnosis present

## 2021-02-02 DIAGNOSIS — Z789 Other specified health status: Secondary | ICD-10-CM

## 2021-02-02 DIAGNOSIS — Z87891 Personal history of nicotine dependence: Secondary | ICD-10-CM | POA: Insufficient documentation

## 2021-02-02 HISTORY — PX: PORTACATH PLACEMENT: SHX2246

## 2021-02-02 IMAGING — DX DG CHEST 1V PORT
1 series · 1 of 1 positions shown · non-contrast
Comparison: Portable exam [63] hours compared to [DATE] and
correlated with PET-CT of [DATE]

CLINICAL DATA: Central venous catheter placement

EXAM:
PORTABLE CHEST 1 VIEW

[chest ap]
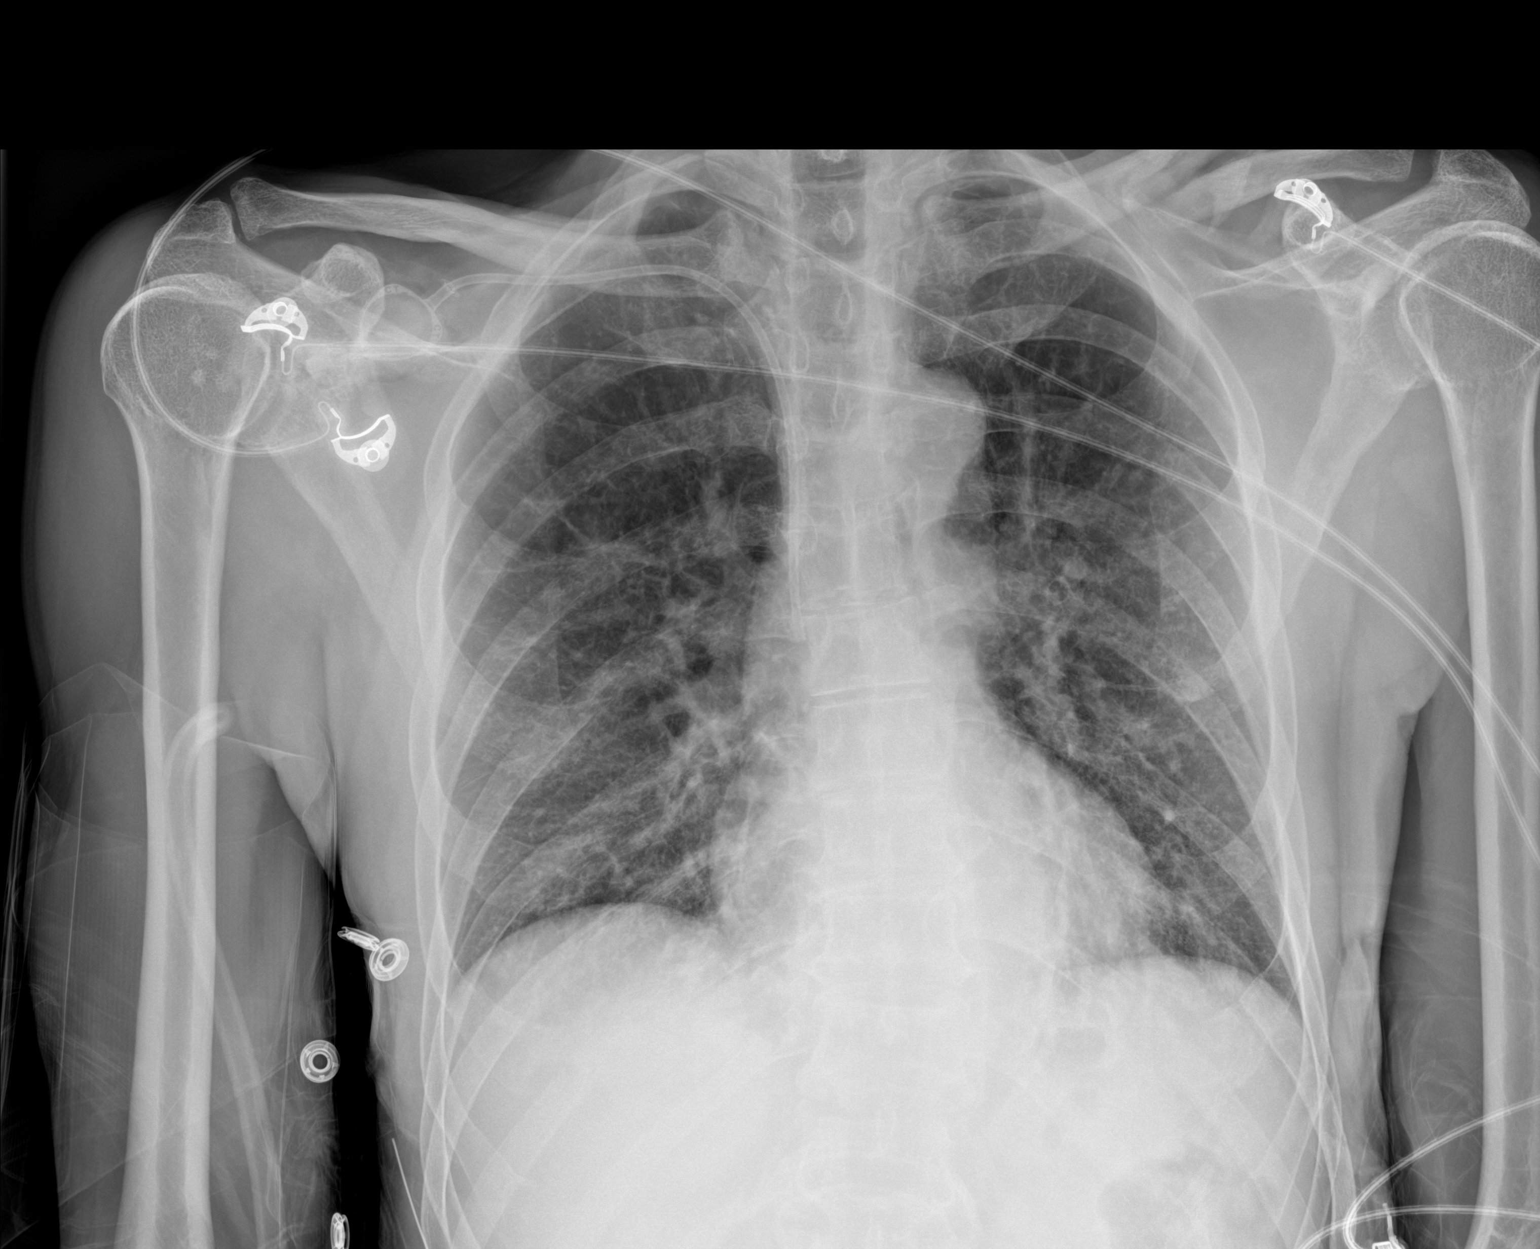

[1 of 1 positions shown; findings below may reference images not displayed]

FINDINGS: New RIGHT subclavian Port-A-Cath with tip projecting over SVC.

Normal heart size, mediastinal contours, and pulmonary vascularity.

Chronic bronchitic changes with increased perihilar interstitial
markings since previous exam extending into LEFT lower lobe.

Subtle area of atelectasis or nodularity RIGHT upper lobe, better
visualized on prior PET-CT.

Nodular density in the mid LEFT lung questionably visualized, better
seen on prior PET-CT.

No pleural effusion or pneumothorax.

Nonunion of old displaced LEFT clavicular fracture.
IMPRESSION: No pneumothorax following Port-A-Cath insertion.

Chronic bronchitic changes with questionable atypical infiltrate in
perihilar regions especially on LEFT.

Atelectasis versus questionable nodular focus in RIGHT upper lobe as
noted on prior PET-CT.

## 2021-02-02 SURGERY — INSERTION, TUNNELED CENTRAL VENOUS DEVICE, WITH PORT
Anesthesia: General | Laterality: Right

## 2021-02-02 MED ORDER — FENTANYL CITRATE (PF) 100 MCG/2ML IJ SOLN
INTRAMUSCULAR | Status: DC | PRN
  Administered 2021-02-02: 25 ug via INTRAVENOUS

## 2021-02-02 MED ORDER — CHLORHEXIDINE GLUCONATE 0.12 % MT SOLN
OROMUCOSAL | Status: AC
  Administered 2021-02-02: 15 mL via OROMUCOSAL
  Filled 2021-02-02: qty 15

## 2021-02-02 MED ORDER — FENTANYL CITRATE (PF) 100 MCG/2ML IJ SOLN: 25.0000 ug | INTRAMUSCULAR | Status: DC | PRN

## 2021-02-02 MED ORDER — CELECOXIB 200 MG PO CAPS
ORAL_CAPSULE | ORAL | Status: AC
  Administered 2021-02-02: 200 mg via ORAL
  Filled 2021-02-02: qty 1

## 2021-02-02 MED ORDER — PROPOFOL 500 MG/50ML IV EMUL
INTRAVENOUS | Status: DC | PRN
  Administered 2021-02-02: 125 ug/kg/min via INTRAVENOUS

## 2021-02-02 MED ORDER — SODIUM CHLORIDE 0.9 % IV SOLN
INTRAVENOUS | Status: DC | PRN
  Administered 2021-02-02: 8 mL via INTRAMUSCULAR

## 2021-02-02 MED ORDER — SODIUM CHLORIDE (PF) 0.9 % IJ SOLN
INTRAMUSCULAR | Status: AC
  Filled 2021-02-02: qty 50

## 2021-02-02 MED ORDER — EPHEDRINE SULFATE 50 MG/ML IJ SOLN
INTRAMUSCULAR | Status: DC | PRN
  Administered 2021-02-02 (×2): 10 mg via INTRAVENOUS

## 2021-02-02 MED ORDER — MIDAZOLAM HCL 2 MG/2ML IJ SOLN
INTRAMUSCULAR | Status: AC
  Filled 2021-02-02: qty 2

## 2021-02-02 MED ORDER — IBUPROFEN 800 MG PO TABS: 800.0000 mg | ORAL_TABLET | Freq: Three times a day (TID) | ORAL | 0 refills | Status: DC | PRN

## 2021-02-02 MED ORDER — PROPOFOL 10 MG/ML IV BOLUS
INTRAVENOUS | Status: DC | PRN
  Administered 2021-02-02: 30 mg via INTRAVENOUS

## 2021-02-02 MED ORDER — PROPOFOL 500 MG/50ML IV EMUL
INTRAVENOUS | Status: AC
  Filled 2021-02-02: qty 50

## 2021-02-02 MED ORDER — BUPIVACAINE-EPINEPHRINE (PF) 0.25% -1:200000 IJ SOLN
INTRAMUSCULAR | Status: AC
  Filled 2021-02-02: qty 30

## 2021-02-02 MED ORDER — EPHEDRINE 5 MG/ML INJ
INTRAVENOUS | Status: AC
  Filled 2021-02-02: qty 5

## 2021-02-02 MED ORDER — SODIUM CHLORIDE 0.9 % IR SOLN
Status: DC | PRN
  Administered 2021-02-02: 500 mL

## 2021-02-02 MED ORDER — CEFAZOLIN SODIUM-DEXTROSE 2-4 GM/100ML-% IV SOLN
INTRAVENOUS | Status: AC
  Filled 2021-02-02: qty 100

## 2021-02-02 MED ORDER — HEPARIN 5000 UNITS IN NS 1000 ML (FLUSH)
INTRAMUSCULAR | Status: DC | PRN
  Administered 2021-02-02: 5 mL via INTRAMUSCULAR

## 2021-02-02 MED ORDER — DEXAMETHASONE SODIUM PHOSPHATE 10 MG/ML IJ SOLN
INTRAMUSCULAR | Status: DC | PRN
  Administered 2021-02-02: 10 mg via INTRAVENOUS

## 2021-02-02 MED ORDER — GABAPENTIN 300 MG PO CAPS
ORAL_CAPSULE | ORAL | Status: AC
  Administered 2021-02-02: 300 mg via ORAL
  Filled 2021-02-02: qty 1

## 2021-02-02 MED ORDER — FENTANYL CITRATE (PF) 100 MCG/2ML IJ SOLN
INTRAMUSCULAR | Status: AC
  Filled 2021-02-02: qty 2

## 2021-02-02 MED ORDER — MIDAZOLAM HCL 2 MG/2ML IJ SOLN
INTRAMUSCULAR | Status: DC | PRN
  Administered 2021-02-02: 2 mg via INTRAVENOUS

## 2021-02-02 MED ORDER — BUPIVACAINE-EPINEPHRINE (PF) 0.25% -1:200000 IJ SOLN
INTRAMUSCULAR | Status: DC | PRN
  Administered 2021-02-02: 8 mL via PERINEURAL

## 2021-02-02 MED ORDER — SODIUM CHLORIDE FLUSH 0.9 % IV SOLN
INTRAVENOUS | Status: AC
  Filled 2021-02-02: qty 10

## 2021-02-02 MED ORDER — HEPARIN SODIUM (PORCINE) 5000 UNIT/ML IJ SOLN
INTRAMUSCULAR | Status: AC
  Filled 2021-02-02: qty 2

## 2021-02-02 MED ORDER — ONDANSETRON HCL 4 MG/2ML IJ SOLN: 4.0000 mg | Freq: Once | INTRAMUSCULAR | Status: DC | PRN

## 2021-02-02 MED ORDER — ACETAMINOPHEN 500 MG PO TABS
ORAL_TABLET | ORAL | Status: AC
  Administered 2021-02-02: 1000 mg via ORAL
  Filled 2021-02-02: qty 2

## 2021-02-02 MED ORDER — ONDANSETRON HCL 4 MG/2ML IJ SOLN
INTRAMUSCULAR | Status: DC | PRN
  Administered 2021-02-02: 4 mg via INTRAVENOUS

## 2021-02-02 SURGICAL SUPPLY — 31 items
BAG DECANTER FOR FLEXI CONT (MISCELLANEOUS) ×2 IMPLANT
BLADE SURG 15 STRL LF DISP TIS (BLADE) ×1 IMPLANT
BLADE SURG 15 STRL SS (BLADE) ×1
BOOT SUTURE AID YELLOW STND (SUTURE) IMPLANT
CHLORAPREP W/TINT 26 (MISCELLANEOUS) ×2 IMPLANT
COVER LIGHT HANDLE STERIS (MISCELLANEOUS) ×4 IMPLANT
DERMABOND ADVANCED (GAUZE/BANDAGES/DRESSINGS) ×1
DERMABOND ADVANCED .7 DNX12 (GAUZE/BANDAGES/DRESSINGS) ×1 IMPLANT
DRAPE C-ARM XRAY 36X54 (DRAPES) ×2 IMPLANT
ELECT CAUTERY BLADE 6.4 (BLADE) ×2 IMPLANT
ELECT REM PT RETURN 9FT ADLT (ELECTROSURGICAL) ×2
ELECTRODE REM PT RTRN 9FT ADLT (ELECTROSURGICAL) ×1 IMPLANT
GAUZE 4X4 16PLY ~~LOC~~+RFID DBL (SPONGE) ×2 IMPLANT
GLOVE SURG ORTHO LTX SZ7.5 (GLOVE) ×4 IMPLANT
GOWN STRL REUS W/ TWL LRG LVL3 (GOWN DISPOSABLE) ×2 IMPLANT
GOWN STRL REUS W/TWL LRG LVL3 (GOWN DISPOSABLE) ×2
IV NS 500ML (IV SOLUTION) ×1
IV NS 500ML BAXH (IV SOLUTION) ×1 IMPLANT
KIT PORT POWER 8FR ISP CVUE (Port) ×2 IMPLANT
KIT TURNOVER KIT A (KITS) ×2 IMPLANT
LABEL OR SOLS (LABEL) ×2 IMPLANT
MANIFOLD NEPTUNE II (INSTRUMENTS) IMPLANT
NEEDLE HYPO 22GX1.5 SAFETY (NEEDLE) ×2 IMPLANT
PACK PORT-A-CATH (MISCELLANEOUS) ×2 IMPLANT
SUT MNCRL AB 4-0 PS2 18 (SUTURE) ×2 IMPLANT
SUT VIC AB 3-0 SH 27 (SUTURE) ×1
SUT VIC AB 3-0 SH 27X BRD (SUTURE) ×1 IMPLANT
SYR 10ML LL (SYRINGE) ×2 IMPLANT
SYR 3ML LL SCALE MARK (SYRINGE) ×2 IMPLANT
SYR 5ML LL (SYRINGE) ×2 IMPLANT
WATER STERILE IRR 500ML POUR (IV SOLUTION) IMPLANT
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
## 2021-02-02 NOTE — Op Note (Signed)
SURGICAL PROCEDURE REPORT  DATE OF PROCEDURE: 02/02/2021   ATTENDING SURGEON:  Ronny Bacon, M.D., FACS   ANESTHESIA: Monitored anesthesia care with local  PRE-OPERATIVE DIAGNOSIS: Advanced/Metastatic rectal cancer requiring durable central venous access for chemotherapy  POST-OPERATIVE DIAGNOSIS: Same.  PROCEDURE: (cpt: 78469, 62952)  Insertion of right subclavian vein Bard PowerPort central venous catheter with subcutaneous port under fluoroscopic guidance   INTRAOPERATIVE FINDINGS: Fluoroscopic confirmation of guidewire in superior vena cava, tip at fluoroscopy appears to be at caval atrial junction, excellent return and easy flush, well-secured tunneled central venous catheter with subcutaneous port at completion of the procedure    FLUOROSCOPY: as recorded.   ESTIMATED BLOOD LOSS: Minimal (<20 mL)   SPECIMENS: None   IMPLANTS: 17F tunneled Bard PowerPort central venous catheter with subcutaneous port  DRAINS: None   COMPLICATIONS: None apparent   CONDITION AT COMPLETION: Hemodynamically stable, awake   DISPOSITION: PACU   INDICATION(S) FOR PROCEDURE:  Patient is a 60 y.o. male who presented with advanced/metastatic rectal cancer requiring durable central venous access for chemotherapy. All risks, benefits, and alternatives to above elective procedures were discussed with the patient, who elected to proceed, and informed consent was accordingly obtained at that time.  DETAILS OF PROCEDURE:  Patient was brought to the operative suite and appropriately identified. Right subclavian access site and planned port placement site were prepped and draped in the usual sterile fashion. Following a brief timeout, in Trendelenburg position, percutaneous Right subclavian venous access was obtained using Seldinger technique, by which local anesthetic was injected over the right subclavian region, and access needle was inserted into the SCV vein, through which soft guidewire was advanced,  over which access needle was withdrawn. Length of catheter needed to position the catheter tip at the atrio-caval junction was then measured under direct fluoroscopic visualization, after which the catheter was cut to the measured length. Guidewire was secured, attention was directed to injection of local anesthetic along the planned port site, 2-3 cm transverse ipsilateral chest incision was made and confirmed to accommodate the subcutaneous port, and flushed measured catheter was attached to port, then the port was placed within the subcutaneous pocket. Insertion sheath was advanced over the guidewire, which was withdrawn along with the insertion sheath dilator.  The catheter was then advanced through the sheath into the SCV and SVC.  Port was confirmed to withdraw blood and flush easily, after which concentrated heparin was instilled into the port and catheter. Dermis at the subcutaneous pocket was re-approximated using buried interrupted 3-0 Vicryl suture, and 4-0 Monocryl suture was used to re-approximate skin at the insertion/subcutaneous port site in running subcuticular fashion for the subcutaneous port and buried interrupted fashion for the insertion site.  Using the straight Huebner needle the port was flushed with heparin at 1000 units/mL 4 to 5 mL was injected through the port.  Skin was cleaned, dried, and sterile skin glue was applied. Patient was then safely transferred to PACU for a chest x-ray.  I was present for all aspects of the procedures, and no intraprocedural complications were apparent.   Ronny Bacon, M.D., [facility] 02/02/2021 11:54 AM

## 2021-02-02 NOTE — Anesthesia Postprocedure Evaluation (Signed)
Anesthesia Post Note  Patient: [name] [name]  Procedure(s) Performed: INSERTION PORT-A-CATH (Right)  Patient location during evaluation: PACU Anesthesia Type: General Level of consciousness: awake and alert and oriented Pain management: pain level controlled Vital Signs Assessment: post-procedure vital signs reviewed and stable Respiratory status: spontaneous breathing, nonlabored ventilation and respiratory function stable Cardiovascular status: blood pressure returned to baseline and stable Postop Assessment: no signs of nausea or vomiting Anesthetic complications: no   No notable events documented.   Last Vitals:  Vitals:   02/02/21 1215 02/02/21 1229  BP: 103/64 114/77  Pulse: 70 67  Resp: 18 16  Temp: 36.6 C 36.6 C  SpO2: 98% 99%    Last Pain:  Vitals:   02/02/21 1229  TempSrc: Temporal  PainSc:                  Corine Solorio

## 2021-02-02 NOTE — Anesthesia Procedure Notes (Signed)
Date/Time: 02/02/2021 11:15 AM Performed by: Doreen Salvage, CRNA Pre-anesthesia Checklist: Patient identified, Emergency Drugs available, Suction available and Patient being monitored Patient Re-evaluated:Patient Re-evaluated prior to induction Oxygen Delivery Method: Simple face mask Induction Type: IV induction Dental Injury: Teeth and Oropharynx as per pre-operative assessment

## 2021-02-02 NOTE — Transfer of Care (Signed)
Immediate Anesthesia Transfer of Care Note  Patient: [name] [name]  Procedure(s) Performed: INSERTION PORT-A-CATH (Right)  Patient Location: PACU  Anesthesia Type:General  Level of Consciousness: awake, drowsy and patient cooperative  Airway & Oxygen Therapy: Patient Spontanous Breathing  Post-op Assessment: Report given to RN and Post -op Vital signs reviewed and stable  Post vital signs: Reviewed and stable  Last Vitals:  Vitals Value Taken Time  BP 91/60 02/02/21 1155  Temp    Pulse 75 02/02/21 1158  Resp 12 02/02/21 1158  SpO2 99 % 02/02/21 1158  Vitals shown include unvalidated device data.  Last Pain:  Vitals:   02/02/21 0943  TempSrc: Temporal  PainSc: 0-No pain         Complications: No notable events documented.

## 2021-02-02 NOTE — Discharge Instructions (Signed)
AMBULATORY SURGERY  DISCHARGE INSTRUCTIONS   The drugs that you were given will stay in your system until tomorrow so for the next 24 hours you should not:  Drive an automobile Make any legal decisions Drink any alcoholic beverage   You may resume regular meals tomorrow.  Today it is better to start with liquids and gradually work up to solid foods.  You may eat anything you prefer, but it is better to start with liquids, then soup and crackers, and gradually work up to solid foods.   Please notify your doctor immediately if you have any unusual bleeding, trouble breathing, redness and pain at the surgery site, drainage, fever, or pain not relieved by medication.       Please contact your physician with any problems or Same Day Surgery at [phone], Monday through Friday 6 am to 4 pm, or [facility] number at [phone].  

## 2021-02-02 NOTE — Interval H&P Note (Signed)
History and Physical Interval Note:  02/02/2021 10:43 AM  [name] [name]  has presented today for surgery, with the diagnosis of rectal cancer.  The various methods of treatment have been discussed with the patient and family. After consideration of risks, benefits and other options for treatment, the patient has consented to  Procedure(s): INSERTION PORT-A-CATH (Right) as a surgical intervention.  The patient's history has been reviewed, patient examined, no change in status, stable for surgery.  I have reviewed the patient's chart and labs.  Questions were answered to the patient's satisfaction.    The right side is marked.   Ronny Bacon

## 2021-02-03 ENCOUNTER — Encounter: Payer: Self-pay | Admitting: Surgery

## 2021-02-03 ENCOUNTER — Ambulatory Visit
Admission: RE | Admit: 2021-02-03 | Discharge: 2021-02-03 | Disposition: A | Discharge status: Home / Self Care | Payer: Medicaid Other | Source: Ambulatory Visit | Attending: Radiation Oncology | Admitting: Radiation Oncology

## 2021-02-03 ENCOUNTER — Inpatient Hospital Stay: Payer: Medicaid Other

## 2021-02-03 DIAGNOSIS — C20 Malignant neoplasm of rectum: Secondary | ICD-10-CM | POA: Diagnosis not present

## 2021-02-04 ENCOUNTER — Other Ambulatory Visit (HOSPITAL_COMMUNITY): Payer: Self-pay

## 2021-02-04 ENCOUNTER — Ambulatory Visit
Admission: RE | Admit: 2021-02-04 | Discharge: 2021-02-04 | Disposition: A | Discharge status: Home / Self Care | Payer: Medicaid Other | Source: Ambulatory Visit | Attending: Radiation Oncology | Admitting: Radiation Oncology

## 2021-02-04 ENCOUNTER — Inpatient Hospital Stay: Payer: Medicaid Other

## 2021-02-04 DIAGNOSIS — C20 Malignant neoplasm of rectum: Secondary | ICD-10-CM | POA: Diagnosis not present

## 2021-02-07 ENCOUNTER — Inpatient Hospital Stay: Payer: Medicaid Other

## 2021-02-07 ENCOUNTER — Ambulatory Visit
Admission: RE | Admit: 2021-02-07 | Discharge: 2021-02-07 | Disposition: A | Discharge status: Home / Self Care | Payer: Medicaid Other | Source: Ambulatory Visit | Attending: Radiation Oncology | Admitting: Radiation Oncology

## 2021-02-07 ENCOUNTER — Other Ambulatory Visit (HOSPITAL_COMMUNITY): Payer: Self-pay

## 2021-02-07 DIAGNOSIS — C20 Malignant neoplasm of rectum: Secondary | ICD-10-CM | POA: Diagnosis not present

## 2021-02-08 ENCOUNTER — Inpatient Hospital Stay: Payer: Medicaid Other

## 2021-02-08 ENCOUNTER — Ambulatory Visit
Admission: RE | Admit: 2021-02-08 | Discharge: 2021-02-08 | Disposition: A | Discharge status: Home / Self Care | Payer: Medicaid Other | Source: Ambulatory Visit | Attending: Radiation Oncology | Admitting: Radiation Oncology

## 2021-02-08 DIAGNOSIS — C20 Malignant neoplasm of rectum: Secondary | ICD-10-CM | POA: Diagnosis not present

## 2021-02-09 ENCOUNTER — Inpatient Hospital Stay: Payer: Medicaid Other

## 2021-02-09 ENCOUNTER — Ambulatory Visit
Admission: RE | Admit: 2021-02-09 | Discharge: 2021-02-09 | Disposition: A | Discharge status: Home / Self Care | Payer: Medicaid Other | Source: Ambulatory Visit | Attending: Radiation Oncology | Admitting: Radiation Oncology

## 2021-02-09 DIAGNOSIS — C20 Malignant neoplasm of rectum: Secondary | ICD-10-CM | POA: Diagnosis not present

## 2021-02-10 ENCOUNTER — Other Ambulatory Visit: Payer: Self-pay

## 2021-02-10 ENCOUNTER — Ambulatory Visit
Admission: RE | Admit: 2021-02-10 | Discharge: 2021-02-10 | Disposition: A | Discharge status: Home / Self Care | Payer: Medicaid Other | Source: Ambulatory Visit | Attending: Radiation Oncology | Admitting: Radiation Oncology

## 2021-02-10 ENCOUNTER — Inpatient Hospital Stay: Payer: Medicaid Other

## 2021-02-10 DIAGNOSIS — C20 Malignant neoplasm of rectum: Secondary | ICD-10-CM | POA: Diagnosis not present

## 2021-02-11 ENCOUNTER — Other Ambulatory Visit: Payer: Self-pay

## 2021-02-11 ENCOUNTER — Ambulatory Visit
Admission: RE | Admit: 2021-02-11 | Discharge: 2021-02-11 | Disposition: A | Discharge status: Home / Self Care | Payer: Medicaid Other | Source: Ambulatory Visit | Attending: Radiation Oncology | Admitting: Radiation Oncology

## 2021-02-11 ENCOUNTER — Inpatient Hospital Stay: Payer: Medicaid Other

## 2021-02-11 DIAGNOSIS — C20 Malignant neoplasm of rectum: Secondary | ICD-10-CM | POA: Diagnosis not present

## 2021-02-11 MED ORDER — DIPHENOXYLATE-ATROPINE 2.5-0.025 MG PO TABS: 1.0000 | ORAL_TABLET | Freq: Four times a day (QID) | ORAL | 0 refills | Status: DC | PRN

## 2021-02-14 ENCOUNTER — Inpatient Hospital Stay: Payer: Medicaid Other

## 2021-02-14 ENCOUNTER — Other Ambulatory Visit: Payer: Self-pay

## 2021-02-14 ENCOUNTER — Other Ambulatory Visit (HOSPITAL_COMMUNITY): Payer: Self-pay

## 2021-02-14 ENCOUNTER — Ambulatory Visit
Admission: RE | Admit: 2021-02-14 | Discharge: 2021-02-14 | Disposition: A | Discharge status: Home / Self Care | Payer: Medicaid Other | Source: Ambulatory Visit | Attending: Radiation Oncology | Admitting: Radiation Oncology

## 2021-02-14 DIAGNOSIS — C20 Malignant neoplasm of rectum: Secondary | ICD-10-CM | POA: Diagnosis not present

## 2021-02-14 MED ORDER — SUCRALFATE 1 G PO TABS: 1.0000 g | ORAL_TABLET | Freq: Three times a day (TID) | ORAL | 1 refills | Status: DC

## 2021-02-15 ENCOUNTER — Other Ambulatory Visit: Payer: Self-pay

## 2021-02-15 ENCOUNTER — Encounter: Payer: Self-pay | Admitting: Nurse Practitioner

## 2021-02-15 ENCOUNTER — Inpatient Hospital Stay: Payer: Medicaid Other

## 2021-02-15 ENCOUNTER — Other Ambulatory Visit: Payer: Self-pay | Admitting: *Deleted

## 2021-02-15 ENCOUNTER — Telehealth: Payer: Self-pay | Admitting: *Deleted

## 2021-02-15 ENCOUNTER — Ambulatory Visit
Admission: RE | Admit: 2021-02-15 | Discharge: 2021-02-15 | Disposition: A | Discharge status: Home / Self Care | Payer: Medicaid Other | Source: Ambulatory Visit | Attending: Radiation Oncology | Admitting: Radiation Oncology

## 2021-02-15 ENCOUNTER — Inpatient Hospital Stay (HOSPITAL_BASED_OUTPATIENT_CLINIC_OR_DEPARTMENT_OTHER): Payer: Medicaid Other | Admitting: Nurse Practitioner

## 2021-02-15 VITALS — BP 105/63 | HR 68 | Temp 97.3°F | Resp 16 | Wt 127.6 lb

## 2021-02-15 DIAGNOSIS — D649 Anemia, unspecified: Secondary | ICD-10-CM

## 2021-02-15 DIAGNOSIS — C20 Malignant neoplasm of rectum: Secondary | ICD-10-CM | POA: Diagnosis not present

## 2021-02-15 LAB — CBC WITH DIFFERENTIAL/PLATELET
Abs Immature Granulocytes: 0.02 10*3/uL (ref 0.00–0.07)
Basophils Absolute: 0 10*3/uL (ref 0.0–0.1)
Basophils Relative: 1 %
Eosinophils Absolute: 0.7 10*3/uL — ABNORMAL HIGH (ref 0.0–0.5)
Eosinophils Relative: 14 %
HCT: 28.6 % — ABNORMAL LOW (ref 39.0–52.0)
Hemoglobin: 9.9 g/dL — ABNORMAL LOW (ref 13.0–17.0)
Immature Granulocytes: 0 %
Lymphocytes Relative: 12 %
Lymphs Abs: 0.6 10*3/uL — ABNORMAL LOW (ref 0.7–4.0)
MCH: 32.6 pg (ref 26.0–34.0)
MCHC: 34.6 g/dL (ref 30.0–36.0)
MCV: 94.1 fL (ref 80.0–100.0)
Monocytes Absolute: 0.4 10*3/uL (ref 0.1–1.0)
Monocytes Relative: 8 %
Neutro Abs: 3.3 10*3/uL (ref 1.7–7.7)
Neutrophils Relative %: 65 %
Platelets: 187 10*3/uL (ref 150–400)
RBC: 3.04 MIL/uL — ABNORMAL LOW (ref 4.22–5.81)
RDW: 14.4 % (ref 11.5–15.5)
WBC: 5 10*3/uL (ref 4.0–10.5)
nRBC: 0 % (ref 0.0–0.2)

## 2021-02-15 LAB — BASIC METABOLIC PANEL
Anion gap: 7 (ref 5–15)
BUN: 5 mg/dL — ABNORMAL LOW (ref 8–23)
CO2: 30 mmol/L (ref 22–32)
Calcium: 9.1 mg/dL (ref 8.9–10.3)
Chloride: 101 mmol/L (ref 98–111)
Creatinine, Ser: 0.76 mg/dL (ref 0.61–1.24)
GFR, Estimated: >60 mL/min (ref >60)
Glucose, Bld: 78 mg/dL (ref 70–99)
Potassium: 3.5 mmol/L (ref 3.5–5.1)
Sodium: 138 mmol/L (ref 135–145)

## 2021-02-15 LAB — PSA: Prostatic Specific Antigen: 18.37 ng/mL — ABNORMAL HIGH (ref 0.00–4.00)

## 2021-02-15 MED ORDER — SUCRALFATE 1 GM/10ML PO SUSP: 1.0000 g | Freq: Three times a day (TID) | ORAL | 2 refills | Status: DC

## 2021-02-15 NOTE — Telephone Encounter (Signed)
Pharmacy called reporting that there is a Psychologist, [facility] services for Sucralfate tabs, but they can get the liquid. They are requesting a new prescription for the liquid sucralfate for this patient as they do not know when the tabs will be available
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
## 2021-02-15 NOTE — Telephone Encounter (Signed)
error 

## 2021-02-16 ENCOUNTER — Ambulatory Visit
Admission: RE | Admit: 2021-02-16 | Discharge: 2021-02-16 | Disposition: A | Discharge status: Home / Self Care | Payer: Medicaid Other | Source: Ambulatory Visit | Attending: Radiation Oncology | Admitting: Radiation Oncology

## 2021-02-16 ENCOUNTER — Inpatient Hospital Stay: Payer: Medicaid Other

## 2021-02-16 DIAGNOSIS — C20 Malignant neoplasm of rectum: Secondary | ICD-10-CM | POA: Diagnosis not present

## 2021-02-18 ENCOUNTER — Other Ambulatory Visit (HOSPITAL_COMMUNITY): Payer: Self-pay

## 2021-02-21 ENCOUNTER — Inpatient Hospital Stay: Payer: Medicaid Other

## 2021-02-21 ENCOUNTER — Inpatient Hospital Stay (HOSPITAL_BASED_OUTPATIENT_CLINIC_OR_DEPARTMENT_OTHER): Payer: Medicaid Other | Admitting: Hospice and Palliative Medicine

## 2021-02-21 ENCOUNTER — Ambulatory Visit
Admission: RE | Admit: 2021-02-21 | Discharge: 2021-02-21 | Disposition: A | Discharge status: Home / Self Care | Payer: Medicaid Other | Source: Ambulatory Visit | Attending: Radiation Oncology | Admitting: Radiation Oncology

## 2021-02-21 ENCOUNTER — Other Ambulatory Visit: Payer: Self-pay

## 2021-02-21 ENCOUNTER — Telehealth: Payer: Self-pay | Admitting: *Deleted

## 2021-02-21 VITALS — BP 104/68 | HR 51 | Temp 97.1°F | Resp 18

## 2021-02-21 DIAGNOSIS — C20 Malignant neoplasm of rectum: Secondary | ICD-10-CM | POA: Insufficient documentation

## 2021-02-21 DIAGNOSIS — R197 Diarrhea, unspecified: Secondary | ICD-10-CM

## 2021-02-21 DIAGNOSIS — R300 Dysuria: Secondary | ICD-10-CM | POA: Insufficient documentation

## 2021-02-21 DIAGNOSIS — Z95828 Presence of other vascular implants and grafts: Secondary | ICD-10-CM

## 2021-02-21 LAB — CBC WITH DIFFERENTIAL/PLATELET
Abs Immature Granulocytes: 0.05 10*3/uL (ref 0.00–0.07)
Basophils Absolute: 0.1 10*3/uL (ref 0.0–0.1)
Basophils Relative: 1 %
Eosinophils Absolute: 0.6 10*3/uL — ABNORMAL HIGH (ref 0.0–0.5)
Eosinophils Relative: 11 %
HCT: 28.9 % — ABNORMAL LOW (ref 39.0–52.0)
Hemoglobin: 10 g/dL — ABNORMAL LOW (ref 13.0–17.0)
Immature Granulocytes: 1 %
Lymphocytes Relative: 14 %
Lymphs Abs: 0.8 10*3/uL (ref 0.7–4.0)
MCH: 33 pg (ref 26.0–34.0)
MCHC: 34.6 g/dL (ref 30.0–36.0)
MCV: 95.4 fL (ref 80.0–100.0)
Monocytes Absolute: 0.5 10*3/uL (ref 0.1–1.0)
Monocytes Relative: 9 %
Neutro Abs: 3.5 10*3/uL (ref 1.7–7.7)
Neutrophils Relative %: 64 %
Platelets: 239 10*3/uL (ref 150–400)
RBC: 3.03 MIL/uL — ABNORMAL LOW (ref 4.22–5.81)
RDW: 16.2 % — ABNORMAL HIGH (ref 11.5–15.5)
WBC: 5.4 10*3/uL (ref 4.0–10.5)
nRBC: 0 % (ref 0.0–0.2)

## 2021-02-21 LAB — URINALYSIS, COMPLETE (UACMP) WITH MICROSCOPIC
Bacteria, UA: NONE SEEN
Bilirubin Urine: NEGATIVE
Glucose, UA: NEGATIVE mg/dL
Hgb urine dipstick: NEGATIVE
Ketones, ur: NEGATIVE mg/dL
Leukocytes,Ua: NEGATIVE
Nitrite: NEGATIVE
Protein, ur: NEGATIVE mg/dL
Specific Gravity, Urine: 1.01 (ref 1.005–1.030)
Squamous Epithelial / HPF: NONE SEEN (ref 0–5)
WBC, UA: NONE SEEN WBC/hpf (ref 0–5)
pH: 5 (ref 5.0–8.0)

## 2021-02-21 LAB — COMPREHENSIVE METABOLIC PANEL
ALT: 15 U/L (ref 0–44)
AST: 20 U/L (ref 15–41)
Albumin: 4.3 g/dL (ref 3.5–5.0)
Alkaline Phosphatase: 73 U/L (ref 38–126)
Anion gap: 7 (ref 5–15)
BUN: 8 mg/dL (ref 8–23)
CO2: 27 mmol/L (ref 22–32)
Calcium: 8.8 mg/dL — ABNORMAL LOW (ref 8.9–10.3)
Chloride: 104 mmol/L (ref 98–111)
Creatinine, Ser: 0.82 mg/dL (ref 0.61–1.24)
GFR, Estimated: >60 mL/min (ref >60)
Glucose, Bld: 102 mg/dL — ABNORMAL HIGH (ref 70–99)
Potassium: 3.6 mmol/L (ref 3.5–5.1)
Sodium: 138 mmol/L (ref 135–145)
Total Bilirubin: 0.3 mg/dL (ref 0.3–1.2)
Total Protein: 6.7 g/dL (ref 6.5–8.1)

## 2021-02-21 LAB — MAGNESIUM: Magnesium: 2.3 mg/dL (ref 1.7–2.4)

## 2021-02-21 MED ORDER — SODIUM CHLORIDE 0.9 % IV SOLN
INTRAVENOUS | Status: DC
  Filled 2021-02-21: qty 250

## 2021-02-21 MED ORDER — HEPARIN SOD (PORK) LOCK FLUSH 100 UNIT/ML IV SOLN
500.0000 [IU] | Freq: Once | INTRAVENOUS | Status: AC
  Administered 2021-02-21: 16:00:00 500 [IU] via INTRAVENOUS
  Filled 2021-02-21: qty 5

## 2021-02-21 MED ORDER — SODIUM CHLORIDE 0.9% FLUSH
10.0000 mL | Freq: Once | INTRAVENOUS | Status: DC
  Filled 2021-02-21: qty 10

## 2021-02-21 NOTE — Progress Notes (Signed)
Pt reports to [facility] with no improvement in diarrhea. Was given Rx for Lomotil, but states that it is not working. He also reports burning with urination.
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
## 2021-02-21 NOTE — Telephone Encounter (Signed)
Patient scheduled for [facility] visit after XRT

## 2021-02-21 NOTE — Telephone Encounter (Signed)
Patient mother called concerned about the state of health for this patient. She reports that he is having severe diarrhea and that he has become forgetful and just looks bad. He has a radiation therapy appointment today at 200. Can he be seen and evaluated whist there?

## 2021-02-22 ENCOUNTER — Ambulatory Visit
Admission: RE | Admit: 2021-02-22 | Discharge: 2021-02-22 | Disposition: A | Discharge status: Home / Self Care | Payer: Medicaid Other | Source: Ambulatory Visit | Attending: Radiation Oncology | Admitting: Radiation Oncology

## 2021-02-22 ENCOUNTER — Other Ambulatory Visit: Payer: Self-pay

## 2021-02-22 ENCOUNTER — Inpatient Hospital Stay: Payer: Medicaid Other

## 2021-02-22 DIAGNOSIS — R197 Diarrhea, unspecified: Secondary | ICD-10-CM

## 2021-02-22 DIAGNOSIS — C20 Malignant neoplasm of rectum: Secondary | ICD-10-CM | POA: Diagnosis not present

## 2021-02-22 LAB — GASTROINTESTINAL PANEL BY PCR, STOOL (REPLACES STOOL CULTURE)

## 2021-02-22 LAB — C DIFFICILE QUICK SCREEN W PCR REFLEX
C Diff antigen: NEGATIVE
C Diff interpretation: NOT DETECTED
C Diff toxin: NEGATIVE

## 2021-02-22 LAB — URINE CULTURE: Culture: NO GROWTH

## 2021-02-23 ENCOUNTER — Ambulatory Visit: Admission: RE | Admit: 2021-02-23 | Payer: Medicaid Other | Source: Ambulatory Visit

## 2021-02-23 ENCOUNTER — Inpatient Hospital Stay: Payer: Medicaid Other

## 2021-02-23 ENCOUNTER — Ambulatory Visit: Payer: Medicaid Other

## 2021-02-23 MED ORDER — PHENAZOPYRIDINE HCL 100 MG PO TABS: 100.0000 mg | ORAL_TABLET | Freq: Three times a day (TID) | ORAL | 0 refills | Status: DC | PRN

## 2021-02-23 NOTE — Addendum Note (Signed)
Addended by: Altha Harm R on: 02/23/2021 02:10 PM   Modules accepted: Orders

## 2021-02-24 ENCOUNTER — Inpatient Hospital Stay: Payer: Medicaid Other

## 2021-02-24 ENCOUNTER — Ambulatory Visit: Payer: Medicaid Other

## 2021-02-25 ENCOUNTER — Ambulatory Visit: Payer: Medicaid Other

## 2021-02-25 ENCOUNTER — Inpatient Hospital Stay: Payer: Medicaid Other

## 2021-02-25 DIAGNOSIS — C20 Malignant neoplasm of rectum: Secondary | ICD-10-CM | POA: Insufficient documentation

## 2021-02-25 DIAGNOSIS — R197 Diarrhea, unspecified: Secondary | ICD-10-CM | POA: Insufficient documentation

## 2021-02-25 DIAGNOSIS — C7802 Secondary malignant neoplasm of left lung: Secondary | ICD-10-CM | POA: Insufficient documentation

## 2021-02-28 ENCOUNTER — Ambulatory Visit
Admission: RE | Admit: 2021-02-28 | Discharge: 2021-02-28 | Disposition: A | Discharge status: Home / Self Care | Payer: Medicaid Other | Source: Ambulatory Visit | Attending: Radiation Oncology | Admitting: Radiation Oncology

## 2021-02-28 ENCOUNTER — Inpatient Hospital Stay: Payer: Medicaid Other

## 2021-02-28 ENCOUNTER — Other Ambulatory Visit: Payer: Self-pay

## 2021-02-28 ENCOUNTER — Inpatient Hospital Stay (HOSPITAL_BASED_OUTPATIENT_CLINIC_OR_DEPARTMENT_OTHER): Payer: Medicaid Other | Admitting: Internal Medicine

## 2021-02-28 ENCOUNTER — Encounter: Payer: Self-pay | Admitting: Internal Medicine

## 2021-02-28 ENCOUNTER — Other Ambulatory Visit: Payer: Self-pay | Admitting: Internal Medicine

## 2021-02-28 DIAGNOSIS — R630 Anorexia: Secondary | ICD-10-CM | POA: Diagnosis not present

## 2021-02-28 DIAGNOSIS — C20 Malignant neoplasm of rectum: Secondary | ICD-10-CM

## 2021-02-28 DIAGNOSIS — Z79899 Other long term (current) drug therapy: Secondary | ICD-10-CM | POA: Diagnosis not present

## 2021-02-28 DIAGNOSIS — Z510 Encounter for antineoplastic radiation therapy: Secondary | ICD-10-CM | POA: Diagnosis not present

## 2021-02-28 DIAGNOSIS — D6481 Anemia due to antineoplastic chemotherapy: Secondary | ICD-10-CM | POA: Diagnosis not present

## 2021-02-28 DIAGNOSIS — R197 Diarrhea, unspecified: Secondary | ICD-10-CM | POA: Diagnosis not present

## 2021-02-28 DIAGNOSIS — D649 Anemia, unspecified: Secondary | ICD-10-CM

## 2021-02-28 LAB — COMPREHENSIVE METABOLIC PANEL
ALT: 18 U/L (ref 0–44)
AST: 22 U/L (ref 15–41)
Albumin: 4.2 g/dL (ref 3.5–5.0)
Alkaline Phosphatase: 72 U/L (ref 38–126)
Anion gap: 9 (ref 5–15)
BUN: 9 mg/dL (ref 8–23)
CO2: 27 mmol/L (ref 22–32)
Calcium: 9 mg/dL (ref 8.9–10.3)
Chloride: 101 mmol/L (ref 98–111)
Creatinine, Ser: 0.74 mg/dL (ref 0.61–1.24)
GFR, Estimated: >60 mL/min (ref >60)
Glucose, Bld: 97 mg/dL (ref 70–99)
Potassium: 3.7 mmol/L (ref 3.5–5.1)
Sodium: 137 mmol/L (ref 135–145)
Total Bilirubin: 0.5 mg/dL (ref 0.3–1.2)
Total Protein: 6.6 g/dL (ref 6.5–8.1)

## 2021-02-28 LAB — CBC WITH DIFFERENTIAL/PLATELET
Abs Immature Granulocytes: 0.02 10*3/uL (ref 0.00–0.07)
Basophils Absolute: 0 10*3/uL (ref 0.0–0.1)
Basophils Relative: 1 %
Eosinophils Absolute: 0.6 10*3/uL — ABNORMAL HIGH (ref 0.0–0.5)
Eosinophils Relative: 9 %
HCT: 30.6 % — ABNORMAL LOW (ref 39.0–52.0)
Hemoglobin: 10.7 g/dL — ABNORMAL LOW (ref 13.0–17.0)
Immature Granulocytes: 0 %
Lymphocytes Relative: 11 %
Lymphs Abs: 0.7 10*3/uL (ref 0.7–4.0)
MCH: 33.2 pg (ref 26.0–34.0)
MCHC: 35 g/dL (ref 30.0–36.0)
MCV: 95 fL (ref 80.0–100.0)
Monocytes Absolute: 0.6 10*3/uL (ref 0.1–1.0)
Monocytes Relative: 9 %
Neutro Abs: 4.6 10*3/uL (ref 1.7–7.7)
Neutrophils Relative %: 70 %
Platelets: 229 10*3/uL (ref 150–400)
RBC: 3.22 MIL/uL — ABNORMAL LOW (ref 4.22–5.81)
RDW: 17.2 % — ABNORMAL HIGH (ref 11.5–15.5)
WBC: 6.6 10*3/uL (ref 4.0–10.5)
nRBC: 0 % (ref 0.0–0.2)

## 2021-02-28 LAB — FERRITIN: Ferritin: 39 ng/mL (ref 24–336)

## 2021-02-28 LAB — IRON AND TIBC
Iron: 83 ug/dL (ref 45–182)
Saturation Ratios: 24 % (ref 17.9–39.5)
TIBC: 343 ug/dL (ref 250–450)
UIBC: 260 ug/dL

## 2021-02-28 LAB — FOLATE: Folate: 19.6 ng/mL (ref >5.9)

## 2021-02-28 LAB — VITAMIN B12: Vitamin B-12: 290 pg/mL (ref 180–914)

## 2021-02-28 MED ORDER — HEPARIN SOD (PORK) LOCK FLUSH 100 UNIT/ML IV SOLN
INTRAVENOUS | Status: AC
  Administered 2021-02-28: 500 [IU] via INTRAVENOUS
  Filled 2021-02-28: qty 5

## 2021-02-28 MED ORDER — CLONAZEPAM 1 MG PO TABS
1.0000 mg | ORAL_TABLET | Freq: Two times a day (BID) | ORAL | 0 refills | Status: DC | PRN
Start: 2021-02-28 — End: 2021-03-30

## 2021-02-28 MED ORDER — PHENAZOPYRIDINE HCL 100 MG PO TABS: 100.0000 mg | ORAL_TABLET | Freq: Three times a day (TID) | ORAL | 0 refills | Status: DC | PRN

## 2021-02-28 MED ORDER — SODIUM CHLORIDE 0.9% FLUSH
10.0000 mL | Freq: Once | INTRAVENOUS | Status: AC
  Administered 2021-02-28: 10 mL via INTRAVENOUS
  Filled 2021-02-28: qty 10

## 2021-02-28 MED ORDER — SODIUM CHLORIDE 0.9 % IV SOLN
Freq: Once | INTRAVENOUS | Status: AC
  Filled 2021-02-28: qty 250

## 2021-02-28 MED ORDER — HEPARIN SOD (PORK) LOCK FLUSH 100 UNIT/ML IV SOLN
500.0000 [IU] | Freq: Once | INTRAVENOUS | Status: AC
  Filled 2021-02-28: qty 5

## 2021-02-28 NOTE — Progress Notes (Signed)
Has concerns about excessive diarrhea even while taking lomotil. Having painful urination even while taking pyridium.

## 2021-02-28 NOTE — Addendum Note (Signed)
Addended by: Delice Bison E on: 02/28/2021 03:37 PM   Modules accepted: Orders

## 2021-02-28 NOTE — Assessment & Plan Note (Addendum)
#   Rectal cancer-  locally advanced disease. MRI- OCT 2022- T4bN2; ? M [see below].  Currently on concurrent chemoradiation with Xeloda. Radiation [until DEC 12th, 2022].    # Patient tolerating therapy poorly/ Xeloda poorly- see below re: diarrhea.  Would recommend discontinuation of Xeloda at this time.  Continue with radiation/defer to radiation oncology.  Patient will need reimaging CT scan chest and pelvis after finishing therapy to reassess response to therapy-and further therapy- like FOLFOX/? candidate for definitive therapy.   #Left lower lobe lung nodule [measuring 11 x 8 mm]with increased metabolic activity- ? LEFT lower lobe nodule with increased metabolic activity suspicious for either metastatic colorectal neoplasm or second primary.  Monitor for now.  Reevaluate with repeat imaging.   # Anxiety: poorly controlled; refilled the Klonipin to 1 mg BID.  #Diarrhea-severe/on Xeloda with radiation-recommend holding Xeloda.  Recommend holding Xeloda.  Recommend IV fluids-twice weekly.  #Radiation cystitis-continue Pyridium; refill.  #Iron deficient anemia hemoglobin 10-11-secondary to chronic GI bleed iron deficiency;HOLD off venofer-as hemoglobin is stable.  # DISPOSITION: # HOLD venofer today; IVFs 1 lit over 1 hour # this week- and next week-Wed/Friday;  IVFs 1 lit # Follow up in 2 weeks- NP; labs- cbc/cmp; possible IVFs--Dr.B
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
## 2021-03-01 ENCOUNTER — Inpatient Hospital Stay: Payer: Medicaid Other | Attending: Internal Medicine

## 2021-03-01 ENCOUNTER — Other Ambulatory Visit (HOSPITAL_COMMUNITY): Payer: Self-pay

## 2021-03-01 ENCOUNTER — Other Ambulatory Visit: Payer: Self-pay | Admitting: *Deleted

## 2021-03-01 ENCOUNTER — Ambulatory Visit
Admission: RE | Admit: 2021-03-01 | Discharge: 2021-03-01 | Disposition: A | Discharge status: Home / Self Care | Payer: Medicaid Other | Source: Ambulatory Visit | Attending: Radiation Oncology | Admitting: Radiation Oncology

## 2021-03-01 DIAGNOSIS — Z510 Encounter for antineoplastic radiation therapy: Secondary | ICD-10-CM | POA: Diagnosis not present

## 2021-03-01 MED ORDER — DIPHENOXYLATE-ATROPINE 2.5-0.025 MG PO TABS: 1.0000 | ORAL_TABLET | Freq: Four times a day (QID) | ORAL | 1 refills | Status: DC | PRN

## 2021-03-02 ENCOUNTER — Ambulatory Visit
Admission: RE | Admit: 2021-03-02 | Discharge: 2021-03-02 | Disposition: A | Discharge status: Home / Self Care | Payer: Medicaid Other | Source: Ambulatory Visit | Attending: Radiation Oncology | Admitting: Radiation Oncology

## 2021-03-02 ENCOUNTER — Other Ambulatory Visit (HOSPITAL_COMMUNITY): Payer: Self-pay

## 2021-03-02 ENCOUNTER — Inpatient Hospital Stay: Payer: Medicaid Other

## 2021-03-02 ENCOUNTER — Ambulatory Visit: Admission: RE | Admit: 2021-03-02 | Payer: Medicaid Other | Source: Ambulatory Visit

## 2021-03-02 ENCOUNTER — Other Ambulatory Visit: Payer: Self-pay

## 2021-03-02 ENCOUNTER — Ambulatory Visit: Payer: Medicaid Other

## 2021-03-02 VITALS — BP 102/68 | HR 55 | Temp 97.0°F | Resp 18

## 2021-03-02 DIAGNOSIS — Z510 Encounter for antineoplastic radiation therapy: Secondary | ICD-10-CM | POA: Diagnosis not present

## 2021-03-02 DIAGNOSIS — E611 Iron deficiency: Secondary | ICD-10-CM

## 2021-03-02 MED ORDER — SODIUM CHLORIDE 0.9 % IV SOLN
Freq: Once | INTRAVENOUS | Status: DC | PRN
  Filled 2021-03-02: qty 250

## 2021-03-02 MED ORDER — ALBUTEROL SULFATE (2.5 MG/3ML) 0.083% IN NEBU
2.5000 mg | INHALATION_SOLUTION | Freq: Once | RESPIRATORY_TRACT | Status: DC | PRN
  Filled 2021-03-02: qty 3

## 2021-03-02 MED ORDER — DIPHENHYDRAMINE HCL 50 MG/ML IJ SOLN: 50.0000 mg | Freq: Once | INTRAMUSCULAR | Status: DC | PRN

## 2021-03-02 MED ORDER — SODIUM CHLORIDE 0.9 % IV SOLN
Freq: Once | INTRAVENOUS | Status: DC
  Filled 2021-03-02: qty 250

## 2021-03-02 MED ORDER — ALTEPLASE 2 MG IJ SOLR
2.0000 mg | Freq: Once | INTRAMUSCULAR | Status: DC | PRN
  Filled 2021-03-02: qty 2

## 2021-03-02 MED ORDER — SODIUM CHLORIDE 0.9% FLUSH
3.0000 mL | Freq: Once | INTRAVENOUS | Status: DC | PRN
  Filled 2021-03-02: qty 3

## 2021-03-02 MED ORDER — HEPARIN SOD (PORK) LOCK FLUSH 100 UNIT/ML IV SOLN
250.0000 [IU] | Freq: Once | INTRAVENOUS | Status: DC | PRN
  Filled 2021-03-02: qty 5

## 2021-03-02 MED ORDER — METHYLPREDNISOLONE SODIUM SUCC 125 MG IJ SOLR: 125.0000 mg | Freq: Once | INTRAMUSCULAR | Status: DC | PRN

## 2021-03-02 MED ORDER — FAMOTIDINE 20 MG IN NS 100 ML IVPB: 20.0000 mg | Freq: Once | INTRAVENOUS | Status: DC | PRN

## 2021-03-02 MED ORDER — HEPARIN SOD (PORK) LOCK FLUSH 100 UNIT/ML IV SOLN
500.0000 [IU] | Freq: Once | INTRAVENOUS | Status: AC | PRN
  Administered 2021-03-02: 500 [IU]
  Filled 2021-03-02: qty 5

## 2021-03-02 MED ORDER — SODIUM CHLORIDE 0.9% FLUSH
10.0000 mL | Freq: Once | INTRAVENOUS | Status: DC | PRN
  Filled 2021-03-02: qty 10

## 2021-03-02 MED ORDER — SODIUM CHLORIDE 0.9 % IV SOLN
Freq: Once | INTRAVENOUS | Status: AC
  Filled 2021-03-02: qty 250

## 2021-03-02 MED ORDER — EPINEPHRINE 0.3 MG/0.3ML IJ SOAJ: 0.3000 mg | Freq: Once | INTRAMUSCULAR | Status: DC | PRN

## 2021-03-02 MED ORDER — SODIUM CHLORIDE 0.9 % IV SOLN: 200.0000 mg | Freq: Once | INTRAVENOUS | Status: DC

## 2021-03-03 ENCOUNTER — Inpatient Hospital Stay: Payer: Medicaid Other

## 2021-03-03 ENCOUNTER — Ambulatory Visit
Admission: RE | Admit: 2021-03-03 | Discharge: 2021-03-03 | Disposition: A | Discharge status: Home / Self Care | Payer: Medicaid Other | Source: Ambulatory Visit | Attending: Radiation Oncology | Admitting: Radiation Oncology

## 2021-03-03 DIAGNOSIS — Z510 Encounter for antineoplastic radiation therapy: Secondary | ICD-10-CM | POA: Diagnosis not present

## 2021-03-04 ENCOUNTER — Other Ambulatory Visit: Payer: Self-pay

## 2021-03-04 ENCOUNTER — Ambulatory Visit
Admission: RE | Admit: 2021-03-04 | Discharge: 2021-03-04 | Disposition: A | Discharge status: Home / Self Care | Payer: Medicaid Other | Source: Ambulatory Visit | Attending: Radiation Oncology | Admitting: Radiation Oncology

## 2021-03-04 ENCOUNTER — Inpatient Hospital Stay: Payer: Medicaid Other

## 2021-03-04 VITALS — BP 109/71 | HR 50 | Temp 96.3°F | Resp 16

## 2021-03-04 DIAGNOSIS — Z510 Encounter for antineoplastic radiation therapy: Secondary | ICD-10-CM | POA: Diagnosis not present

## 2021-03-04 DIAGNOSIS — E611 Iron deficiency: Secondary | ICD-10-CM

## 2021-03-04 MED ORDER — SODIUM CHLORIDE 0.9 % IV SOLN
Freq: Once | INTRAVENOUS | Status: AC
  Filled 2021-03-04: qty 250

## 2021-03-04 MED ORDER — SODIUM CHLORIDE 0.9% FLUSH
10.0000 mL | Freq: Once | INTRAVENOUS | Status: DC | PRN
  Filled 2021-03-04: qty 10

## 2021-03-04 MED ORDER — HEPARIN SOD (PORK) LOCK FLUSH 100 UNIT/ML IV SOLN
500.0000 [IU] | Freq: Once | INTRAVENOUS | Status: AC | PRN
  Administered 2021-03-04: 500 [IU]
  Filled 2021-03-04: qty 5

## 2021-03-07 ENCOUNTER — Inpatient Hospital Stay: Payer: Medicaid Other

## 2021-03-07 ENCOUNTER — Ambulatory Visit
Admission: RE | Admit: 2021-03-07 | Discharge: 2021-03-07 | Disposition: A | Discharge status: Home / Self Care | Payer: Medicaid Other | Source: Ambulatory Visit | Attending: Radiation Oncology | Admitting: Radiation Oncology

## 2021-03-07 ENCOUNTER — Other Ambulatory Visit: Payer: Self-pay

## 2021-03-07 VITALS — BP 108/77 | HR 61 | Temp 97.0°F | Resp 16

## 2021-03-07 DIAGNOSIS — Z510 Encounter for antineoplastic radiation therapy: Secondary | ICD-10-CM | POA: Diagnosis not present

## 2021-03-07 DIAGNOSIS — E611 Iron deficiency: Secondary | ICD-10-CM

## 2021-03-07 MED ORDER — SODIUM CHLORIDE 0.9 % IV SOLN
Freq: Once | INTRAVENOUS | Status: AC
  Filled 2021-03-07: qty 250

## 2021-03-07 MED ORDER — HEPARIN SOD (PORK) LOCK FLUSH 100 UNIT/ML IV SOLN
500.0000 [IU] | Freq: Once | INTRAVENOUS | Status: AC | PRN
  Administered 2021-03-07: 500 [IU]
  Filled 2021-03-07: qty 5

## 2021-03-07 MED ORDER — SODIUM CHLORIDE 0.9% FLUSH
10.0000 mL | Freq: Once | INTRAVENOUS | Status: AC | PRN
  Administered 2021-03-07: 10 mL
  Filled 2021-03-07: qty 10

## 2021-03-07 NOTE — Patient Instructions (Signed)

## 2021-03-07 NOTE — Progress Notes (Signed)
Pt states he completed XRT today. Reports having a good appetite. Denies any pain. Received 1 L NS today.

## 2021-03-09 ENCOUNTER — Other Ambulatory Visit (HOSPITAL_COMMUNITY): Payer: Self-pay

## 2021-03-09 ENCOUNTER — Inpatient Hospital Stay: Payer: Medicaid Other

## 2021-03-09 ENCOUNTER — Other Ambulatory Visit: Payer: Self-pay

## 2021-03-09 VITALS — BP 115/81 | HR 62 | Temp 97.0°F | Resp 18 | Wt 128.0 lb

## 2021-03-09 DIAGNOSIS — E611 Iron deficiency: Secondary | ICD-10-CM

## 2021-03-09 DIAGNOSIS — Z510 Encounter for antineoplastic radiation therapy: Secondary | ICD-10-CM | POA: Diagnosis not present

## 2021-03-09 MED ORDER — SODIUM CHLORIDE 0.9 % IV SOLN
Freq: Once | INTRAVENOUS | Status: AC
  Filled 2021-03-09: qty 250

## 2021-03-09 MED ORDER — SODIUM CHLORIDE 0.9% FLUSH
10.0000 mL | Freq: Once | INTRAVENOUS | Status: AC | PRN
  Administered 2021-03-09: 13:00:00 10 mL
  Filled 2021-03-09: qty 10

## 2021-03-09 MED ORDER — HEPARIN SOD (PORK) LOCK FLUSH 100 UNIT/ML IV SOLN
500.0000 [IU] | Freq: Once | INTRAVENOUS | Status: AC | PRN
  Administered 2021-03-09: 14:00:00 500 [IU]
  Filled 2021-03-09: qty 5

## 2021-03-09 NOTE — Progress Notes (Signed)
Patient states that he is feeling like he has lost his good spirit and losing interest in things.

## 2021-03-09 NOTE — Progress Notes (Signed)
Pt received 1L NS  today.Ambulatory. States he is eating sandwiches and easy to prepare meals. Discharged to home.

## 2021-03-11 ENCOUNTER — Inpatient Hospital Stay: Payer: Medicaid Other

## 2021-03-11 ENCOUNTER — Other Ambulatory Visit (HOSPITAL_COMMUNITY): Payer: Self-pay

## 2021-03-11 ENCOUNTER — Other Ambulatory Visit: Payer: Self-pay

## 2021-03-11 VITALS — BP 107/76 | HR 70 | Temp 98.4°F | Resp 18

## 2021-03-11 DIAGNOSIS — E611 Iron deficiency: Secondary | ICD-10-CM

## 2021-03-11 DIAGNOSIS — Z510 Encounter for antineoplastic radiation therapy: Secondary | ICD-10-CM | POA: Diagnosis not present

## 2021-03-11 MED ORDER — HEPARIN SOD (PORK) LOCK FLUSH 100 UNIT/ML IV SOLN
500.0000 [IU] | Freq: Once | INTRAVENOUS | Status: AC | PRN
  Administered 2021-03-11: 500 [IU]
  Filled 2021-03-11: qty 5

## 2021-03-11 MED ORDER — SODIUM CHLORIDE 0.9% FLUSH
10.0000 mL | Freq: Once | INTRAVENOUS | Status: AC | PRN
  Administered 2021-03-11: 10 mL
  Filled 2021-03-11: qty 10

## 2021-03-11 MED ORDER — SODIUM CHLORIDE 0.9 % IV SOLN
Freq: Once | INTRAVENOUS | Status: AC
  Filled 2021-03-11: qty 250

## 2021-03-11 NOTE — Progress Notes (Signed)
Pt only had 1 BM yesterday during the day. No   stools at all last night. Diarrhea is definitely improving. Eating regular meals. Discharged to home after receiving IV hydration.

## 2021-03-14 ENCOUNTER — Encounter: Payer: Self-pay | Admitting: Oncology

## 2021-03-14 ENCOUNTER — Inpatient Hospital Stay: Payer: Medicaid Other

## 2021-03-14 ENCOUNTER — Other Ambulatory Visit: Payer: Self-pay

## 2021-03-14 ENCOUNTER — Telehealth: Payer: Self-pay | Admitting: Internal Medicine

## 2021-03-14 ENCOUNTER — Inpatient Hospital Stay (HOSPITAL_BASED_OUTPATIENT_CLINIC_OR_DEPARTMENT_OTHER): Payer: Medicaid Other | Admitting: Oncology

## 2021-03-14 VITALS — BP 115/90 | HR 62 | Temp 97.0°F | Resp 18 | Wt 125.0 lb

## 2021-03-14 DIAGNOSIS — Z510 Encounter for antineoplastic radiation therapy: Secondary | ICD-10-CM | POA: Diagnosis not present

## 2021-03-14 DIAGNOSIS — F419 Anxiety disorder, unspecified: Secondary | ICD-10-CM

## 2021-03-14 DIAGNOSIS — E611 Iron deficiency: Secondary | ICD-10-CM

## 2021-03-14 DIAGNOSIS — C20 Malignant neoplasm of rectum: Secondary | ICD-10-CM

## 2021-03-14 DIAGNOSIS — F32A Depression, unspecified: Secondary | ICD-10-CM

## 2021-03-14 LAB — COMPREHENSIVE METABOLIC PANEL
ALT: 18 U/L (ref 0–44)
AST: 24 U/L (ref 15–41)
Albumin: 4.2 g/dL (ref 3.5–5.0)
Alkaline Phosphatase: 79 U/L (ref 38–126)
Anion gap: 7 (ref 5–15)
BUN: 8 mg/dL (ref 8–23)
CO2: 27 mmol/L (ref 22–32)
Calcium: 8.9 mg/dL (ref 8.9–10.3)
Chloride: 101 mmol/L (ref 98–111)
Creatinine, Ser: 0.77 mg/dL (ref 0.61–1.24)
GFR, Estimated: >60 mL/min (ref >60)
Glucose, Bld: 92 mg/dL (ref 70–99)
Potassium: 4.1 mmol/L (ref 3.5–5.1)
Sodium: 135 mmol/L (ref 135–145)
Total Bilirubin: 0.5 mg/dL (ref 0.3–1.2)
Total Protein: 6.9 g/dL (ref 6.5–8.1)

## 2021-03-14 LAB — CBC WITH DIFFERENTIAL/PLATELET
Abs Immature Granulocytes: 0.03 10*3/uL (ref 0.00–0.07)
Basophils Absolute: 0.1 10*3/uL (ref 0.0–0.1)
Basophils Relative: 1 %
Eosinophils Absolute: 0.4 10*3/uL (ref 0.0–0.5)
Eosinophils Relative: 8 %
HCT: 34.2 % — ABNORMAL LOW (ref 39.0–52.0)
Hemoglobin: 11.7 g/dL — ABNORMAL LOW (ref 13.0–17.0)
Immature Granulocytes: 1 %
Lymphocytes Relative: 19 %
Lymphs Abs: 0.9 10*3/uL (ref 0.7–4.0)
MCH: 33.1 pg (ref 26.0–34.0)
MCHC: 34.2 g/dL (ref 30.0–36.0)
MCV: 96.6 fL (ref 80.0–100.0)
Monocytes Absolute: 0.6 10*3/uL (ref 0.1–1.0)
Monocytes Relative: 12 %
Neutro Abs: 3 10*3/uL (ref 1.7–7.7)
Neutrophils Relative %: 59 %
Platelets: 168 10*3/uL (ref 150–400)
RBC: 3.54 MIL/uL — ABNORMAL LOW (ref 4.22–5.81)
RDW: 15.9 % — ABNORMAL HIGH (ref 11.5–15.5)
WBC: 5 10*3/uL (ref 4.0–10.5)
nRBC: 0 % (ref 0.0–0.2)

## 2021-03-14 MED ORDER — SODIUM CHLORIDE 0.9 % IV SOLN
Freq: Once | INTRAVENOUS | Status: AC
  Filled 2021-03-14: qty 250

## 2021-03-14 MED ORDER — SODIUM CHLORIDE 0.9% FLUSH
10.0000 mL | Freq: Once | INTRAVENOUS | Status: AC | PRN
  Administered 2021-03-14: 10:00:00 10 mL
  Filled 2021-03-14: qty 10

## 2021-03-14 MED ORDER — ESCITALOPRAM OXALATE 10 MG PO TABS: 10.0000 mg | ORAL_TABLET | Freq: Every day | ORAL | 0 refills | Status: DC

## 2021-03-14 MED ORDER — HEPARIN SOD (PORK) LOCK FLUSH 100 UNIT/ML IV SOLN
500.0000 [IU] | Freq: Once | INTRAVENOUS | Status: AC | PRN
  Administered 2021-03-14: 11:00:00 500 [IU]
  Filled 2021-03-14: qty 5
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
## 2021-03-14 NOTE — Telephone Encounter (Signed)
Spoke with patient about his scheduled CT scan (gave him day/time/prep kit and NPO instructions). He stated that he would see if his son can stop by outpatient imaging to pick up the contrast. Also let him know about his f/u scheduled with Dr. Jacinto Reap. Mailing updated AVS.

## 2021-03-14 NOTE — Addendum Note (Signed)
Addended by: Vanice Sarah on: 03/14/2021 01:33 PM   Modules accepted: Orders

## 2021-03-14 NOTE — Progress Notes (Signed)
Pt has been getting IVFs 3x per week. Reports no diarrhea at all over the weekend. Eating regular foods. Denies pain.

## 2021-03-23 ENCOUNTER — Ambulatory Visit: Payer: Medicaid Other

## 2021-03-23 ENCOUNTER — Other Ambulatory Visit: Payer: Self-pay | Admitting: *Deleted

## 2021-03-23 ENCOUNTER — Ambulatory Visit
Admission: RE | Admit: 2021-03-23 | Discharge: 2021-03-23 | Disposition: A | Discharge status: Home / Self Care | Payer: Medicaid Other | Source: Ambulatory Visit | Attending: Oncology | Admitting: Oncology

## 2021-03-23 DIAGNOSIS — C20 Malignant neoplasm of rectum: Secondary | ICD-10-CM | POA: Insufficient documentation

## 2021-03-23 DIAGNOSIS — E611 Iron deficiency: Secondary | ICD-10-CM

## 2021-03-23 IMAGING — CT CT CHEST-ABD-PELV W/O CM
2 of 4 series · 13 of 36 positions shown, 15 images · non-contrast
Comparison: [DATE] and PET-CT from [DATE]

CLINICAL DATA: Rectal cancer restaging

EXAM:
CT CHEST, ABDOMEN AND PELVIS WITHOUT CONTRAST
TECHNIQUE: Multidetector CT imaging of the chest, abdomen and pelvis was
performed following the standard protocol without IV contrast.

[Series 2: cap wo st · axial · 0.72mm/px · z∈[-1076,-491]mm · 10 of 141 slices shown, 12 images]
[im 12/141  mediastinal]
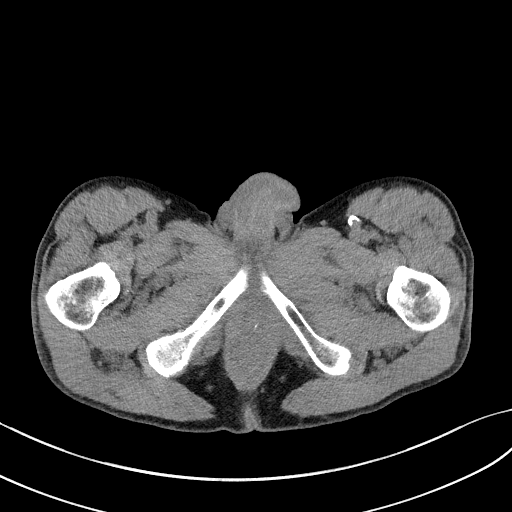
[im 12/141  bone]
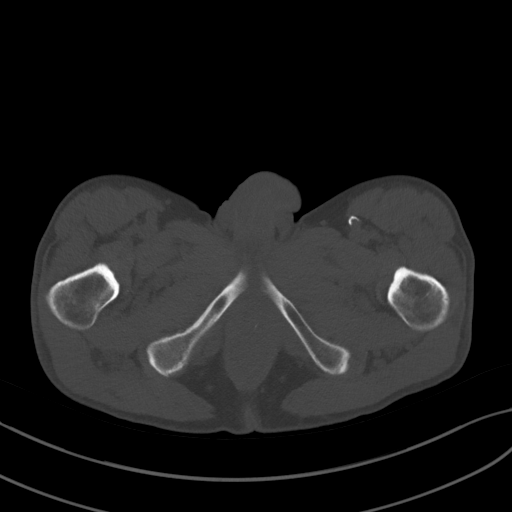
[im 24/141  mediastinal]
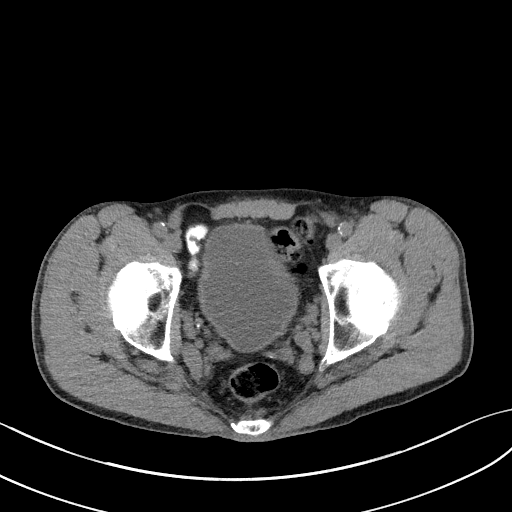
[im 36/141  mediastinal]
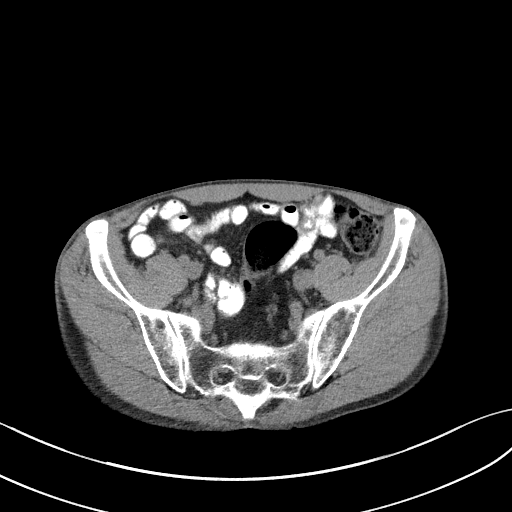
[im 47/141  mediastinal]
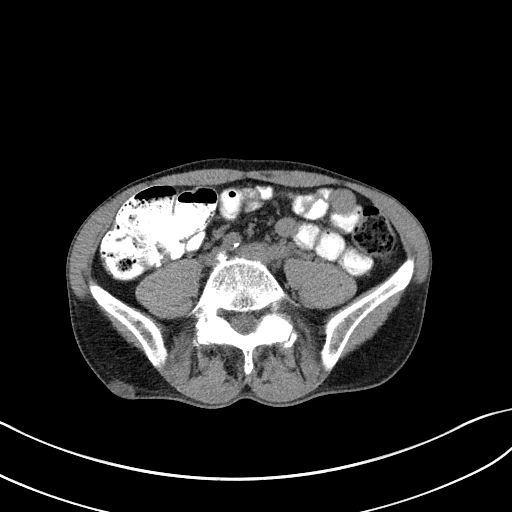
[im 59/141  mediastinal]
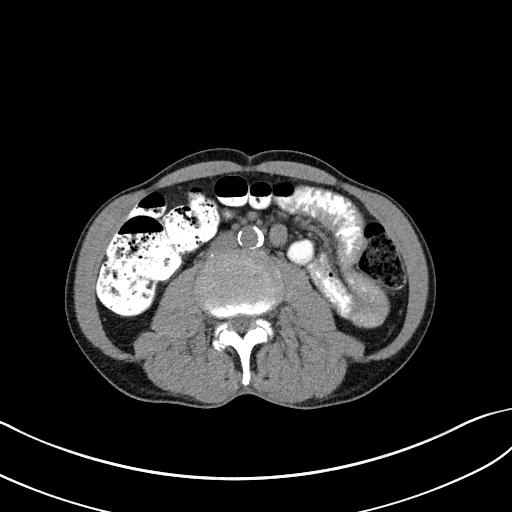
[im 82/141  mediastinal]
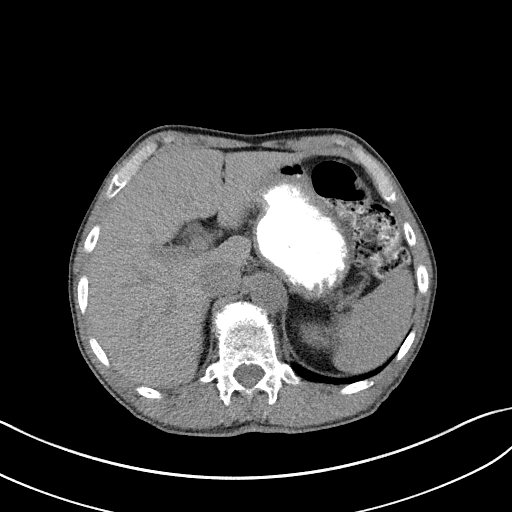
[im 94/141  mediastinal]
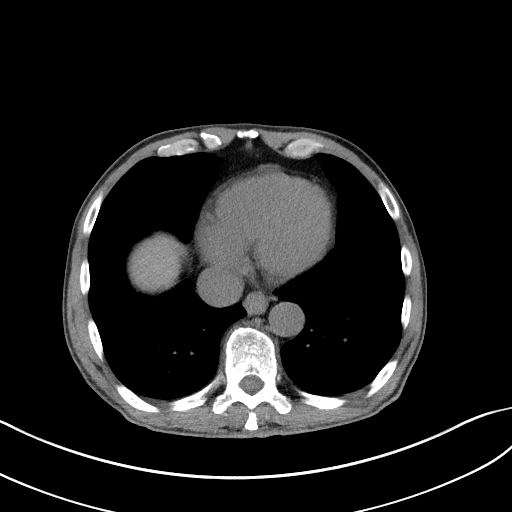
[im 106/141  mediastinal]
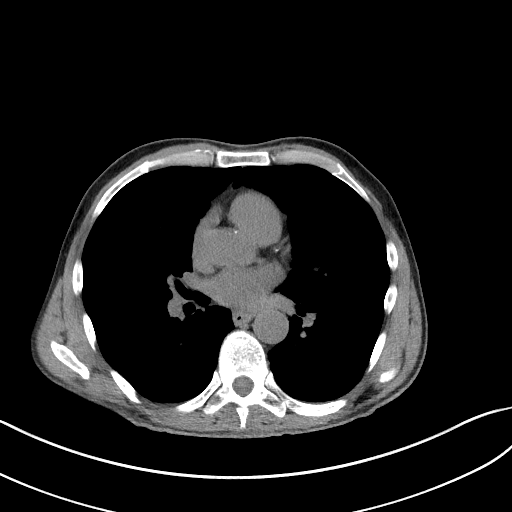
[im 117/141  mediastinal]
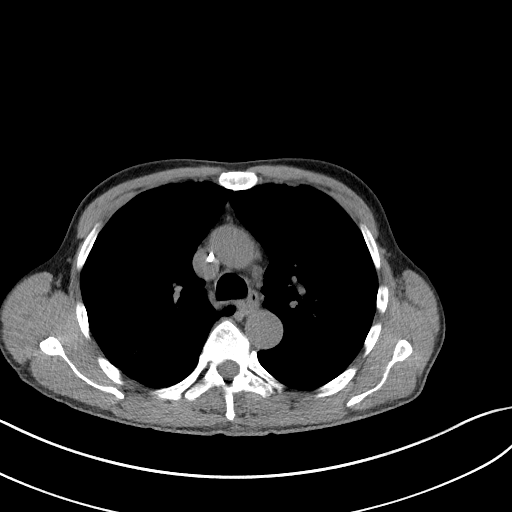
[im 117/141  bone]
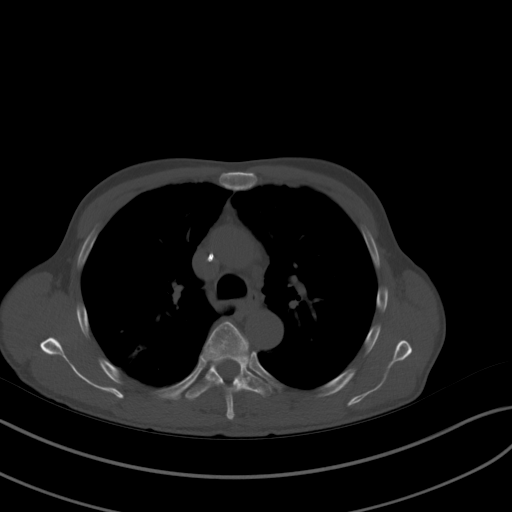
[im 129/141  mediastinal]
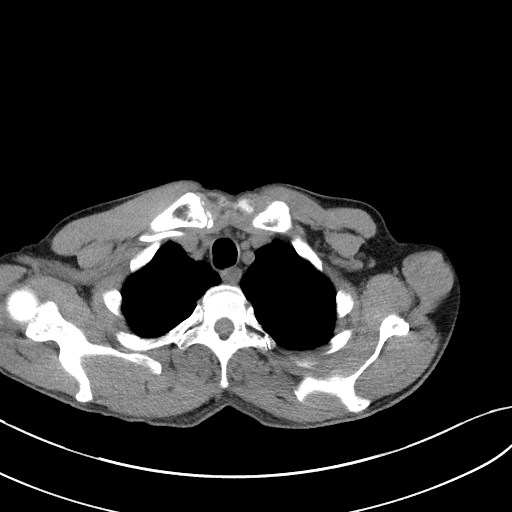

[Series 5: coronal · coronal · 0.64mm/px · 3 of 117 slices shown]
[im 24/117  mediastinal]
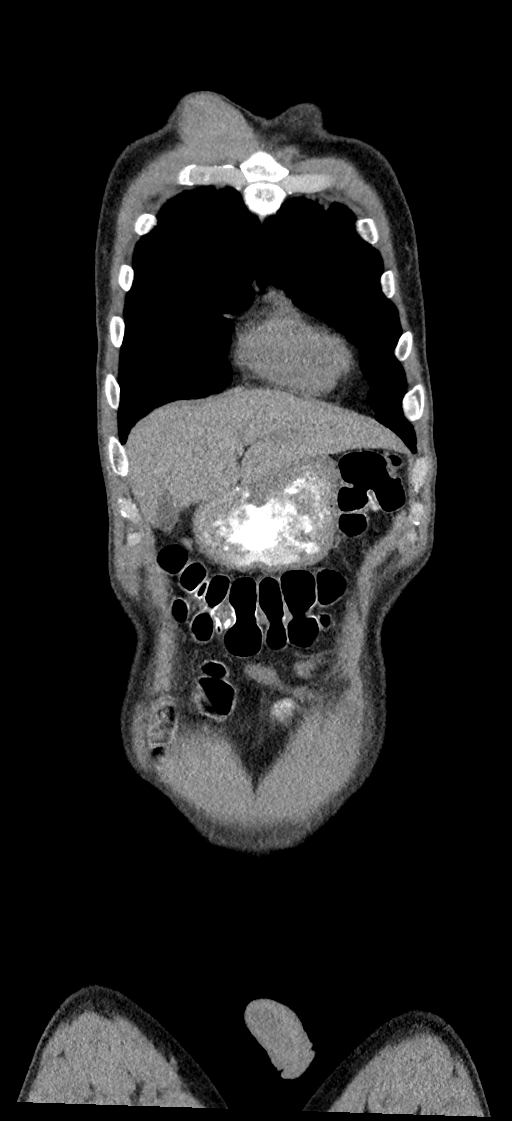
[im 47/117  mediastinal]
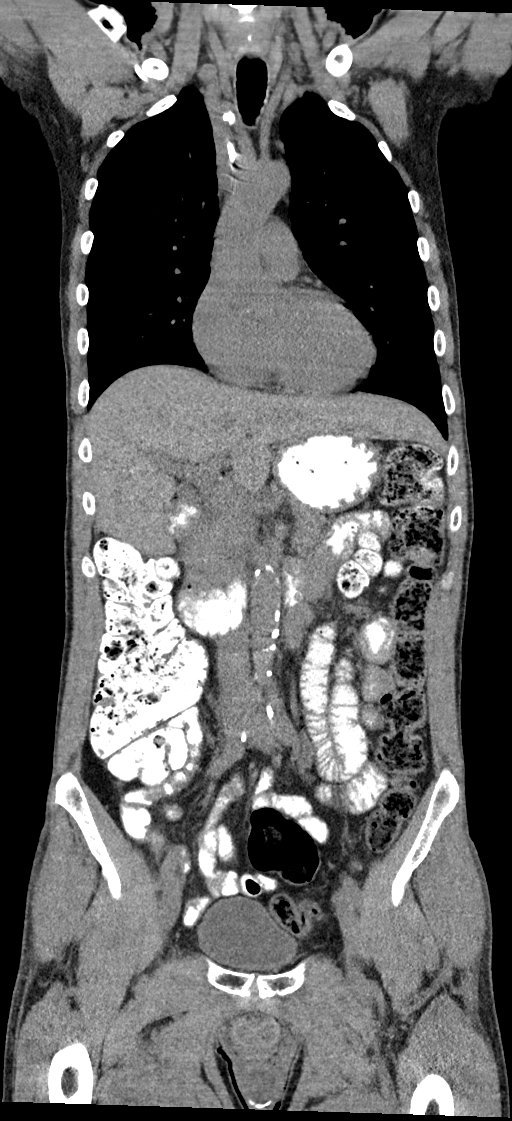
[im 70/117  mediastinal]
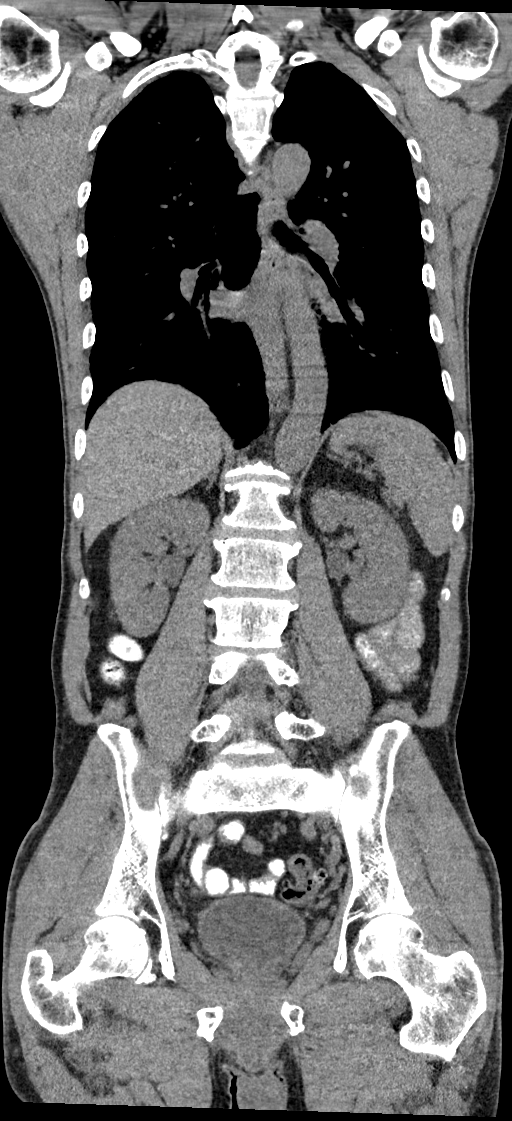

[13 of 36 positions shown; findings below may reference images not displayed]

FINDINGS: CT CHEST FINDINGS

Cardiovascular: Right Port-A-Cath tip: SVC.

Coronary, aortic arch, and branch vessel atherosclerotic vascular
disease.

Mediastinum/Nodes: Small type 1 hiatal hernia.

Lungs/Pleura: Centrilobular and paraseptal emphysema. New bandlike
density in the posteroinferior right upper lobe on image 99 series
4, probably from atelectasis.

The bandlike density with some adjacent nodularity in the right
upper lobe is again observed with nodular component measuring 9 by 7
mm on image 58 of series 4, stable from PET-CT of [DATE].

0.8 by 0.6 cm left upper lobe nodule on image 80 of series 4 was
previously hypermetabolic and previously measured 1.1 by 0.9 cm on
[DATE]

Musculoskeletal: Left clavicular fracture identified with woven bone
along the margins of the fracture but no true bony union at this
point.

CT ABDOMEN PELVIS FINDINGS

Hepatobiliary: Unremarkable noncontrast CT appearance of the liver.

Pancreas: Unremarkable

Spleen: Unremarkable

Adrenals/Urinary Tract: Unremarkable

Stomach/Bowel: Small type 1 hiatal hernia.

Circumferential wall thickening of the lower rectum is again
observed for example on image 127 series 2, compatible with rectal
tumor. Accentuated perirectal vasculature. A small left perirectal
node with a fatty hilum measures 0.3 cm in short axis on image 117
of series 2, previously same on [DATE].

Vascular/Lymphatic: Atherosclerosis is present, including aortoiliac
atherosclerotic disease. No pathologic adenopathy is identified.

Reproductive: No noncontrast CT abnormality is identified.

Other: No supplemental non-categorized findings.

Musculoskeletal: Lower lumbar facet arthropathy and degenerative
disc disease.
IMPRESSION: 1. Continued circumferential wall thickening in the lower rectum
suspicious for tumor. No pathologic adenopathy identified.
2. The previously hypermetabolic left lower lobe nodule is mildly
reduced in size, currently 0.8 by 0.6 cm and formerly 1.1 by 0.9 cm.
3. The bandlike right upper lobe density with small nodular
component is stable.
4. Other imaging findings of potential clinical significance: Aortic
Atherosclerosis ([56]-[56]). Coronary atherosclerosis. Emphysema
([56]-[56]). Small type 1 hiatal hernia. Lower lumbar spondylosis
and degenerative disc disease. Left mid clavicular fracture with
woven bone along the fracture margins but no current bony union.

## 2021-03-24 ENCOUNTER — Ambulatory Visit: Payer: Medicaid Other

## 2021-03-30 ENCOUNTER — Encounter: Payer: Self-pay | Admitting: Internal Medicine

## 2021-03-30 ENCOUNTER — Inpatient Hospital Stay: Payer: Medicaid Other

## 2021-03-30 ENCOUNTER — Inpatient Hospital Stay (HOSPITAL_BASED_OUTPATIENT_CLINIC_OR_DEPARTMENT_OTHER): Payer: Medicaid Other | Admitting: Internal Medicine

## 2021-03-30 ENCOUNTER — Inpatient Hospital Stay: Payer: Medicaid Other | Attending: Radiation Oncology

## 2021-03-30 ENCOUNTER — Other Ambulatory Visit: Payer: Self-pay

## 2021-03-30 DIAGNOSIS — Z5111 Encounter for antineoplastic chemotherapy: Secondary | ICD-10-CM | POA: Insufficient documentation

## 2021-03-30 DIAGNOSIS — D509 Iron deficiency anemia, unspecified: Secondary | ICD-10-CM | POA: Insufficient documentation

## 2021-03-30 DIAGNOSIS — Z87891 Personal history of nicotine dependence: Secondary | ICD-10-CM | POA: Insufficient documentation

## 2021-03-30 DIAGNOSIS — R911 Solitary pulmonary nodule: Secondary | ICD-10-CM | POA: Diagnosis not present

## 2021-03-30 DIAGNOSIS — C20 Malignant neoplasm of rectum: Secondary | ICD-10-CM

## 2021-03-30 DIAGNOSIS — F419 Anxiety disorder, unspecified: Secondary | ICD-10-CM | POA: Diagnosis not present

## 2021-03-30 DIAGNOSIS — E611 Iron deficiency: Secondary | ICD-10-CM

## 2021-03-30 LAB — COMPREHENSIVE METABOLIC PANEL
ALT: 14 U/L (ref 0–44)
AST: 18 U/L (ref 15–41)
Albumin: 4.2 g/dL (ref 3.5–5.0)
Alkaline Phosphatase: 85 U/L (ref 38–126)
Anion gap: 4 — ABNORMAL LOW (ref 5–15)
BUN: 9 mg/dL (ref 8–23)
CO2: 28 mmol/L (ref 22–32)
Calcium: 9 mg/dL (ref 8.9–10.3)
Chloride: 105 mmol/L (ref 98–111)
Creatinine, Ser: 0.92 mg/dL (ref 0.61–1.24)
GFR, Estimated: >60 mL/min (ref >60)
Glucose, Bld: 110 mg/dL — ABNORMAL HIGH (ref 70–99)
Potassium: 3.7 mmol/L (ref 3.5–5.1)
Sodium: 137 mmol/L (ref 135–145)
Total Bilirubin: 0.6 mg/dL (ref 0.3–1.2)
Total Protein: 7.1 g/dL (ref 6.5–8.1)

## 2021-03-30 LAB — CBC WITH DIFFERENTIAL/PLATELET
Abs Immature Granulocytes: 0.02 10*3/uL (ref 0.00–0.07)
Basophils Absolute: 0 10*3/uL (ref 0.0–0.1)
Basophils Relative: 1 %
Eosinophils Absolute: 0.5 10*3/uL (ref 0.0–0.5)
Eosinophils Relative: 8 %
HCT: 34.6 % — ABNORMAL LOW (ref 39.0–52.0)
Hemoglobin: 11.8 g/dL — ABNORMAL LOW (ref 13.0–17.0)
Immature Granulocytes: 0 %
Lymphocytes Relative: 19 %
Lymphs Abs: 1.1 10*3/uL (ref 0.7–4.0)
MCH: 33.1 pg (ref 26.0–34.0)
MCHC: 34.1 g/dL (ref 30.0–36.0)
MCV: 97.2 fL (ref 80.0–100.0)
Monocytes Absolute: 0.5 10*3/uL (ref 0.1–1.0)
Monocytes Relative: 8 %
Neutro Abs: 3.8 10*3/uL (ref 1.7–7.7)
Neutrophils Relative %: 64 %
Platelets: 199 10*3/uL (ref 150–400)
RBC: 3.56 MIL/uL — ABNORMAL LOW (ref 4.22–5.81)
RDW: 14.7 % (ref 11.5–15.5)
WBC: 5.9 10*3/uL (ref 4.0–10.5)
nRBC: 0 % (ref 0.0–0.2)

## 2021-03-30 MED ORDER — CLONAZEPAM 1 MG PO TABS: 1.0000 mg | ORAL_TABLET | Freq: Two times a day (BID) | ORAL | 0 refills | Status: DC | PRN

## 2021-03-30 MED ORDER — DIPHENOXYLATE-ATROPINE 2.5-0.025 MG PO TABS: 1.0000 | ORAL_TABLET | Freq: Four times a day (QID) | ORAL | 1 refills | Status: DC | PRN

## 2021-03-30 NOTE — Progress Notes (Signed)
START ON PATHWAY REGIMEN - Colorectal     A cycle is every 14 days:     Oxaliplatin      Leucovorin      Fluorouracil      Fluorouracil   **Always confirm dose/schedule in your pharmacy ordering system**  Patient Characteristics: Preoperative or Nonsurgical Candidate (Clinical Staging), Rectal, cT3 - cT4, cN0 or Any cT, cN+ Tumor Location: Rectal Therapeutic Status: Preoperative or Nonsurgical Candidate (Clinical Staging) AJCC T Category: cT4a AJCC N Category: cN2a AJCC M Category: cM0 AJCC 8 Stage Grouping: IIIC Intent of Therapy: Curative Intent, Discussed with Patient

## 2021-03-30 NOTE — Assessment & Plan Note (Addendum)
#   Rectal cancer-  locally advanced disease. MRI- OCT 2022- T4bN2; ? M [see below].  s/p  concurrent chemoradiation with Xeloda. Radiation [until DEC 12th, 2022].  March 23, 2021-CT scan shows Continued circumferential wall thickening in the lower rectum suspicious for tumor. No pathologic adenopathy identified.  #Patient had moderate to severe difficulties with chemoradiation.  I discussed option of surgery vs-proceeding with FOLFOX neoadjuvant chemotherapy.  We will make a surgical referral.   #Plan FOLFOX chemotherapy every 2 weeks x 8 treatments; however if patient tolerating chemotherapy poorly/I think is reasonable to hold chemotherapy-and proceed with definitive therapy. We will plan to start in 2 weeks.  # PET 2022-LEFT lower lobe nodule with increased metabolic activity suspicious for either metastatic colorectal neoplasm or second primary.  Mildly reduced in size, currently 0.8 by 0.6 cm [ formerly 1.1 by 0.9 cm]-monitor for now.  # Anxiety: poorly controlled; refilled the Klonipin to 1 mg BID.  #Iron deficient anemia hemoglobin 10-11-secondary to chronic GI bleed iron deficiency;HOLD off venofer-as hemoglobin is stable.  # DISPOSITION: # refer to colorectal surgery/[place] [Dr.white/Dr.Gross] re: rectal cancer # Follow up 16th week-MD;; labs- cbc/cmp; FOLFOX [new] pump off d-2 days later---Dr.B  # I reviewed the blood work- with the patient in detail; also reviewed the imaging independently [as summarized above]; and with the patient in detail.

## 2021-03-30 NOTE — Progress Notes (Signed)
Pt states he is having anxiety today because he ran out of his clonopin. It was rx'd bid and he misleadingly took tid.
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
## 2021-04-01 ENCOUNTER — Telehealth: Payer: Self-pay

## 2021-04-01 NOTE — Telephone Encounter (Signed)
Went ahead and faxed referral to [facility] [phone]. P:[phone]

## 2021-04-01 NOTE — Telephone Encounter (Signed)
Is there another surgeon you could refer pt to for his rectal cancer, [facility] is out of network and he would be self pay?

## 2021-04-01 NOTE — Telephone Encounter (Signed)
I spoke with pt, he would [facility] to go to [facility] if they take his [facility], if not, I will call [facility]. Had to leave message with Marla Roe at [facility] [phone] to see if they accept pts insurance.

## 2021-04-04 ENCOUNTER — Encounter: Payer: Self-pay | Admitting: Radiation Oncology

## 2021-04-04 ENCOUNTER — Other Ambulatory Visit: Payer: Self-pay

## 2021-04-04 ENCOUNTER — Inpatient Hospital Stay: Payer: Medicaid Other

## 2021-04-04 ENCOUNTER — Ambulatory Visit
Admission: RE | Admit: 2021-04-04 | Discharge: 2021-04-04 | Disposition: A | Discharge status: Home / Self Care | Payer: Medicaid Other | Source: Ambulatory Visit | Attending: Radiation Oncology | Admitting: Radiation Oncology

## 2021-04-04 VITALS — BP 113/86 | HR 80 | Temp 96.6°F | Resp 16 | Wt 127.7 lb

## 2021-04-04 DIAGNOSIS — C20 Malignant neoplasm of rectum: Secondary | ICD-10-CM | POA: Diagnosis present

## 2021-04-04 DIAGNOSIS — K5900 Constipation, unspecified: Secondary | ICD-10-CM | POA: Insufficient documentation

## 2021-04-04 DIAGNOSIS — Z923 Personal history of irradiation: Secondary | ICD-10-CM | POA: Insufficient documentation

## 2021-04-04 DIAGNOSIS — Z9221 Personal history of antineoplastic chemotherapy: Secondary | ICD-10-CM | POA: Insufficient documentation

## 2021-04-04 DIAGNOSIS — R197 Diarrhea, unspecified: Secondary | ICD-10-CM | POA: Diagnosis not present

## 2021-04-04 NOTE — Progress Notes (Signed)
Radiation Oncology Follow up Note  Name: [name] [name]   Date:   04/04/2021 MRN:  [id number] DOB: 1960/05/10    This 61 y.o. male presents to the clinic today for 1 month follow-up status post concurrent chemoradiation therapy in a neoadjuvant fashion for locally advanced low rectal adenocarcinoma.  REFERRING PROVIDER: [facility], [place]  HPI: Patient is a 61 year old male now at 1 month having completed concurrent chemoradiation therapy in a neoadjuvant fashion for low-lying rectal adenocarcinoma.  He had difficulty throughout his treatments with increased lower urinary tract symptoms diarrhea and pain.  He was able to complete his treatments in a timely fashion.  Seen today in routine follow-up he is doing fairly well his urinary symptoms have improved he still has intermittent diarrhea and constipation does take some Imodium.Marland Kitchen  He had a recent CT scan at the end of December showing continued circumferential wall thickening lower rectum no pathologic adenopathy identified previously hypermetabolic left lower lobe nodule is mildly reduced in size.  He is not yet been contacted by colorectal surgery at [facility].  COMPLICATIONS OF TREATMENT: none  FOLLOW UP COMPLIANCE: keeps appointments   PHYSICAL EXAM:  BP 113/86    Pulse 80    Temp (!) 96.6 F (35.9 C) (Tympanic)    Resp 16    Wt 127 lb 11.2 oz (57.9 kg)    BMI 20.00 kg/m  Frail-appearing male in NAD.  Well-developed well-nourished patient in NAD. HEENT reveals PERLA, EOMI, discs not visualized.  Oral cavity is clear. No oral mucosal lesions are identified. Neck is clear without evidence of cervical or supraclavicular adenopathy. Lungs are clear to A&P. Cardiac examination is essentially unremarkable with regular rate and rhythm without murmur rub or thrill. Abdomen is benign with no organomegaly or masses noted. Motor sensory and DTR levels are equal and symmetric in the upper and lower extremities. Cranial nerves II through XII are grossly  intact. Proprioception is intact. No peripheral adenopathy or edema is identified. No motor or sensory levels are noted. Crude visual fields are within normal range.  RADIOLOGY RESULTS: CT scan reviewed compatible with above-stated findings  PLAN: At this time we will contact Dr. Velora Mediate office to alert him that he is not yet been contacted by colorectal surgery at [facility].  Anticipate him to have surgery in the next month or 2 and I have asked to see him back for follow-up in the next 3 to 4 months.  Anticipate seeing final pathology at that time.  Patient knows to call with any concerns.  I would like to take this opportunity to thank you for allowing me to participate in the care of your patient.Noreene Filbert, MD

## 2021-04-04 NOTE — Progress Notes (Signed)
Pharmacist Chemotherapy Monitoring - Initial Assessment    Anticipated start date: 04/13/21   The following has been reviewed per standard work regarding the patient's treatment regimen: The patient's diagnosis, treatment plan and drug doses, and organ/hematologic function Lab orders and baseline tests specific to treatment regimen  The treatment plan start date, drug sequencing, and pre-medications Prior authorization status  Patient's documented medication list, including drug-drug interaction screen and prescriptions for anti-emetics and supportive care specific to the treatment regimen The drug concentrations, fluid compatibility, administration routes, and timing of the medications to be used The patient's access for treatment and lifetime cumulative dose history, if applicable  The patient's medication allergies and previous infusion related reactions, if applicable   Changes made to treatment plan:  treatment plan date  Follow up needed:  signing treatment plan   Adelina Mings, [place], 04/04/2021  1:09 PM

## 2021-04-05 ENCOUNTER — Other Ambulatory Visit (INDEPENDENT_AMBULATORY_CARE_PROVIDER_SITE_OTHER): Payer: Medicaid Other

## 2021-04-05 ENCOUNTER — Encounter (INDEPENDENT_AMBULATORY_CARE_PROVIDER_SITE_OTHER): Payer: Medicaid Other | Admitting: Vascular Surgery

## 2021-04-05 ENCOUNTER — Encounter (INDEPENDENT_AMBULATORY_CARE_PROVIDER_SITE_OTHER): Payer: Medicaid Other

## 2021-04-05 NOTE — Telephone Encounter (Signed)
I spoke with Jinny Blossom at [facility], pt wa sch'd for 04/07/2021 with surgeon, dr canceled appt, no reason was given per [facility].  I called the pt, he states a dr from [facility] called him and told him they would not do his surgery and would refer him to [facility].   I spoke with dr Burlene Arnt about the situation, he advised that I reach out to Sundra Aland, here at [facility] for help.

## 2021-04-07 NOTE — Telephone Encounter (Signed)
I have spoken to a scheduler at [facility] who states that the referral was forwarded to the correct facility [facility] in [place] ([place]:  [phone]).  The scheduler is going to call that office to check status of the appointment and call back on Dr. Sharmaine Base clinical team line.

## 2021-04-07 NOTE — Telephone Encounter (Signed)
Referral has been received by correct facility for scheduling and they will be getting him scheduled today.

## 2021-04-11 ENCOUNTER — Encounter: Payer: Self-pay | Admitting: Internal Medicine

## 2021-04-11 NOTE — Telephone Encounter (Signed)
Attempted to call the [place] specialty office to check status of referral but the office is closed in observance of Gwynne Edinger, Brooke Bonito Day.

## 2021-04-12 ENCOUNTER — Other Ambulatory Visit (HOSPITAL_COMMUNITY): Payer: Self-pay

## 2021-04-12 NOTE — Telephone Encounter (Signed)
Confirmed with [facility] that referral has been received.  They are now waiting to hear from the financial care team before scheduling.  This usually takes 24-48 hours and patient should be scheduled an appointment by tomorrow.

## 2021-04-13 ENCOUNTER — Inpatient Hospital Stay: Payer: Medicaid Other

## 2021-04-13 ENCOUNTER — Other Ambulatory Visit: Payer: Self-pay

## 2021-04-13 ENCOUNTER — Inpatient Hospital Stay (HOSPITAL_BASED_OUTPATIENT_CLINIC_OR_DEPARTMENT_OTHER): Payer: Medicaid Other | Admitting: Internal Medicine

## 2021-04-13 ENCOUNTER — Encounter: Payer: Self-pay | Admitting: Internal Medicine

## 2021-04-13 DIAGNOSIS — C20 Malignant neoplasm of rectum: Secondary | ICD-10-CM | POA: Diagnosis not present

## 2021-04-13 DIAGNOSIS — Z5111 Encounter for antineoplastic chemotherapy: Secondary | ICD-10-CM | POA: Diagnosis not present

## 2021-04-13 LAB — CBC WITH DIFFERENTIAL/PLATELET
Abs Immature Granulocytes: 0.02 10*3/uL (ref 0.00–0.07)
Basophils Absolute: 0.1 10*3/uL (ref 0.0–0.1)
Basophils Relative: 1 %
Eosinophils Absolute: 1.6 10*3/uL — ABNORMAL HIGH (ref 0.0–0.5)
Eosinophils Relative: 26 %
HCT: 35.8 % — ABNORMAL LOW (ref 39.0–52.0)
Hemoglobin: 12.3 g/dL — ABNORMAL LOW (ref 13.0–17.0)
Immature Granulocytes: 0 %
Lymphocytes Relative: 20 %
Lymphs Abs: 1.2 10*3/uL (ref 0.7–4.0)
MCH: 32.7 pg (ref 26.0–34.0)
MCHC: 34.4 g/dL (ref 30.0–36.0)
MCV: 95.2 fL (ref 80.0–100.0)
Monocytes Absolute: 0.6 10*3/uL (ref 0.1–1.0)
Monocytes Relative: 10 %
Neutro Abs: 2.6 10*3/uL (ref 1.7–7.7)
Neutrophils Relative %: 43 %
Platelets: 153 10*3/uL (ref 150–400)
RBC: 3.76 MIL/uL — ABNORMAL LOW (ref 4.22–5.81)
RDW: 13.7 % (ref 11.5–15.5)
WBC: 6 10*3/uL (ref 4.0–10.5)
nRBC: 0 % (ref 0.0–0.2)

## 2021-04-13 LAB — COMPREHENSIVE METABOLIC PANEL
ALT: 12 U/L (ref 0–44)
AST: 20 U/L (ref 15–41)
Albumin: 4.3 g/dL (ref 3.5–5.0)
Alkaline Phosphatase: 87 U/L (ref 38–126)
Anion gap: 7 (ref 5–15)
BUN: 11 mg/dL (ref 8–23)
CO2: 26 mmol/L (ref 22–32)
Calcium: 9.1 mg/dL (ref 8.9–10.3)
Chloride: 105 mmol/L (ref 98–111)
Creatinine, Ser: 0.69 mg/dL (ref 0.61–1.24)
GFR, Estimated: >60 mL/min (ref >60)
Glucose, Bld: 87 mg/dL (ref 70–99)
Potassium: 4 mmol/L (ref 3.5–5.1)
Sodium: 138 mmol/L (ref 135–145)
Total Bilirubin: 0.5 mg/dL (ref 0.3–1.2)
Total Protein: 6.8 g/dL (ref 6.5–8.1)

## 2021-04-13 MED ORDER — OXALIPLATIN CHEMO INJECTION 100 MG/20ML
85.0000 mg/m2 | Freq: Once | INTRAVENOUS | Status: AC
  Administered 2021-04-13: 140 mg via INTRAVENOUS
  Filled 2021-04-13: qty 20

## 2021-04-13 MED ORDER — SODIUM CHLORIDE 0.9 % IV SOLN
2400.0000 mg/m2 | INTRAVENOUS | Status: DC
  Administered 2021-04-13: 4000 mg via INTRAVENOUS
  Filled 2021-04-13: qty 80

## 2021-04-13 MED ORDER — LEUCOVORIN CALCIUM INJECTION 350 MG
392.0000 mg/m2 | Freq: Once | INTRAVENOUS | Status: AC
  Administered 2021-04-13: 650 mg via INTRAVENOUS
  Filled 2021-04-13: qty 32.5

## 2021-04-13 MED ORDER — DEXTROSE 5 % IV SOLN
Freq: Once | INTRAVENOUS | Status: AC
  Filled 2021-04-13: qty 250

## 2021-04-13 MED ORDER — SODIUM CHLORIDE 0.9 % IV SOLN
10.0000 mg | Freq: Once | INTRAVENOUS | Status: AC
  Administered 2021-04-13: 10 mg via INTRAVENOUS
  Filled 2021-04-13: qty 10

## 2021-04-13 MED ORDER — PALONOSETRON HCL INJECTION 0.25 MG/5ML
0.2500 mg | Freq: Once | INTRAVENOUS | Status: AC
  Administered 2021-04-13: 0.25 mg via INTRAVENOUS
  Filled 2021-04-13: qty 5

## 2021-04-13 NOTE — Assessment & Plan Note (Addendum)
#   Rectal cancer-  locally advanced disease. MRI- OCT 2022- T4bN2; ? M [see below].  s/p  concurrent chemoradiation with Xeloda. Radiation [until DEC 12th, 2022].  March 23, 2021-CT scan shows Continued circumferential wall thickening in the lower rectum suspicious for tumor. No pathologic adenopathy identified. See discussion below re: Lung nodule. PLAN neo-adjuvant  FOLFOX chemotherapy every 2 weeks x 8 treatments; however if patient tolerating chemotherapy poorly/I think is reasonable to hold chemotherapy-and proceed with definitive therapy. Awaiting for surgery evaluation at [facility].   # Proceed with neoadjuvant  FOLFOX #1/8; Labs today reviewed;  acceptable for treatment today. Again discussed re: potenatial side effects including cold sensitivity.   # PET 2022-LEFT lower lobe nodule with increased metabolic activity suspicious for either metastatic colorectal neoplasm or second primary.  Mildly reduced in size, currently 0.8 by 0.6 cm [ formerly 1.1 by 0.9 cm]-monitor for now.  # Anxiety: poorly controlled; continue the Klonipin to 1 mg BID [[name]].  Patient was reeducated multiple times regarding the plan for taking the pump home; and disconnecting in 2 days.   #Iron deficient anemia hemoglobin 12-secondary to chronic GI bleed iron deficiency;HOLD off venofer-as hemoglobin is stable.  Lucianne Lei pt # DISPOSITION: # Proceed with FOLFOX chemo- # 1 week- labs- cbc/bmp- Possible 1 lit IVFs.  # Follow up 2 weeks- MD; labs- cbc/cmp; FOLFOX; pump off d-2 days later---Dr.B

## 2021-04-13 NOTE — Progress Notes (Addendum)
Per MD to continue with chemotherapy infusion and chemotherapy pump today. RN thoroughly went over Chemotherapy administration. All patient's questions and concerns were addressed at this time. RN also went over discharge instructions and AVS with patient. All questions were answered at this time. RN educated patient on the importance of notifying the clinic if any complications occur at home and when to seek emergency care. RN also educated pt who to call and where the number to call is located on the pump if any complications occur with the pump. Pt verbalized understanding. Per pt he felt comfortable going home with chemotherapy pump. VSS. Pt stable for discharge.    Derry Arbogast [employer]
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
## 2021-04-13 NOTE — Patient Instructions (Addendum)
[facility] [place]  Discharge Instructions: Thank you for choosing [facility] to provide your oncology and hematology care.  If you have a lab appointment with the [facility], please go directly to the [facility] and check in at the registration area.  Wear comfortable clothing and clothing appropriate for easy access to any Portacath or PICC line.   We strive to give you quality time with your provider. You may need to reschedule your appointment if you arrive late (15 or more minutes).  Arriving late affects you and other patients whose appointments are after yours.  Also, if you miss three or more appointments without notifying the office, you may be dismissed from the clinic at the providers discretion.      For prescription refill requests, have your pharmacy contact our office and allow 72 hours for refills to be completed.    Today you received the following chemotherapy and/or immunotherapy agents oxaliplatin, leucovorin, Fluorouracil   Fluorouracil, 5-FU injection What is this medication? FLUOROURACIL, 5-FU (flure oh YOOR a sil) is a chemotherapy drug. It slows the growth of cancer cells. This medicine is used to treat many types of cancer like breast cancer, colon or rectal cancer, pancreatic cancer, and stomach cancer. This medicine may be used for other purposes; ask your health care provider or pharmacist if you have questions. COMMON BRAND NAME(S): Adrucil What should I tell my care team before I take this medication? They need to know if you have any of these conditions: blood disorders dihydropyrimidine dehydrogenase (DPD) deficiency infection (especially a virus infection such as chickenpox, cold sores, or herpes) kidney disease liver disease malnourished, poor nutrition recent or ongoing radiation therapy an unusual or allergic reaction to fluorouracil, other chemotherapy, other medicines, foods, dyes, or  preservatives pregnant or trying to get pregnant breast-feeding How should I use this medication? This drug is given as an infusion or injection into a vein. It is administered in a hospital or clinic by a specially trained health care professional. Talk to your pediatrician regarding the use of this medicine in children. Special care may be needed. Overdosage: If you think you have taken too much of this medicine contact a poison control center or emergency room at once. NOTE: This medicine is only for you. Do not share this medicine with others. What if I miss a dose? It is important not to miss your dose. Call your doctor or health care professional if you are unable to keep an appointment. What may interact with this medication? Do not take this medicine with any of the following medications: live virus vaccines This medicine may also interact with the following medications: medicines that treat or prevent blood clots like warfarin, enoxaparin, and dalteparin This list may not describe all possible interactions. Give your health care provider a list of all the medicines, herbs, non-prescription drugs, or dietary supplements you use. Also tell them if you smoke, drink alcohol, or use illegal drugs. Some items may interact with your medicine. What should I watch for while using this medication? Visit your doctor for checks on your progress. This drug may make you feel generally unwell. This is not uncommon, as chemotherapy can affect healthy cells as well as cancer cells. Report any side effects. Continue your course of treatment even though you feel ill unless your doctor tells you to stop. In some cases, you may be given additional medicines to help with side effects. Follow all directions for their use. Call  your doctor or health care professional for advice if you get a fever, chills or sore throat, or other symptoms of a cold or flu. Do not treat yourself. This drug decreases your body's  ability to fight infections. Try to avoid being around people who are sick. This medicine may increase your risk to bruise or bleed. Call your doctor or health care professional if you notice any unusual bleeding. Be careful brushing and flossing your teeth or using a toothpick because you may get an infection or bleed more easily. If you have any dental work done, tell your dentist you are receiving this medicine. Avoid taking products that contain aspirin, acetaminophen, ibuprofen, naproxen, or ketoprofen unless instructed by your doctor. These medicines may hide a fever. Do not become pregnant while taking this medicine. Women should inform their doctor if they wish to become pregnant or think they might be pregnant. There is a potential for serious side effects to an unborn child. Talk to your health care professional or pharmacist for more information. Do not breast-feed an infant while taking this medicine. Men should inform their doctor if they wish to father a child. This medicine may lower sperm counts. Do not treat diarrhea with over the counter products. Contact your doctor if you have diarrhea that lasts more than 2 days or if it is severe and watery. This medicine can make you more sensitive to the sun. Keep out of the sun. If you cannot avoid being in the sun, wear protective clothing and use sunscreen. Do not use sun lamps or tanning beds/booths. What side effects may I notice from receiving this medication? Side effects that you should report to your doctor or health care professional as soon as possible: allergic reactions like skin rash, itching or hives, swelling of the face, lips, or tongue low blood counts - this medicine may decrease the number of white blood cells, red blood cells and platelets. You may be at increased risk for infections and bleeding. signs of infection - fever or chills, cough, sore throat, pain or difficulty passing urine signs of decreased platelets or  bleeding - bruising, pinpoint red spots on the skin, black, tarry stools, blood in the urine signs of decreased red blood cells - unusually weak or tired, fainting spells, lightheadedness breathing problems changes in vision chest pain mouth sores nausea and vomiting pain, swelling, redness at site where injected pain, tingling, numbness in the hands or feet redness, swelling, or sores on hands or feet stomach pain unusual bleeding Side effects that usually do not require medical attention (report to your doctor or health care professional if they continue or are bothersome): changes in finger or toe nails diarrhea dry or itchy skin hair loss headache loss of appetite sensitivity of eyes to the light stomach upset unusually teary eyes This list may not describe all possible side effects. Call your doctor for medical advice about side effects. You may report side effects to FDA at 1-800-FDA-1088. Where should I keep my medication? This drug is given in a hospital or clinic and will not be stored at home. NOTE: This sheet is a summary. It may not cover all possible information. If you have questions about this medicine, talk to your doctor, pharmacist, or health care provider.  2022 Elsevier/Gold Standard (2020-11-30 00:00:00) Leucovorin injection What is this medication? LEUCOVORIN (loo koe VOR in) is used to prevent or treat the harmful effects of some medicines. This medicine is used to treat anemia caused by a low  amount of folic acid in the body. It is also used with 5-fluorouracil (5-FU) to treat colon cancer. This medicine may be used for other purposes; ask your health care provider or pharmacist if you have questions. What should I tell my care team before I take this medication? They need to know if you have any of these conditions: anemia from low levels of vitamin B-12 in the blood an unusual or allergic reaction to leucovorin, folic acid, other medicines, foods, dyes, or  preservatives pregnant or trying to get pregnant breast-feeding How should I use this medication? This medicine is for injection into a muscle or into a vein. It is given by a health care professional in a hospital or clinic setting. Talk to your pediatrician regarding the use of this medicine in children. Special care may be needed. Overdosage: If you think you have taken too much of this medicine contact a poison control center or emergency room at once. NOTE: This medicine is only for you. Do not share this medicine with others. What if I miss a dose? This does not apply. What may interact with this medication? capecitabine fluorouracil phenobarbital phenytoin primidone trimethoprim-sulfamethoxazole This list may not describe all possible interactions. Give your health care provider a list of all the medicines, herbs, non-prescription drugs, or dietary supplements you use. Also tell them if you smoke, drink alcohol, or use illegal drugs. Some items may interact with your medicine. What should I watch for while using this medication? Your condition will be monitored carefully while you are receiving this medicine. This medicine may increase the side effects of 5-fluorouracil, 5-FU. Tell your doctor or health care professional if you have diarrhea or mouth sores that do not get better or that get worse. What side effects may I notice from receiving this medication? Side effects that you should report to your doctor or health care professional as soon as possible: allergic reactions like skin rash, itching or hives, swelling of the face, lips, or tongue breathing problems fever, infection mouth sores unusual bleeding or bruising unusually weak or tired Side effects that usually do not require medical attention (report to your doctor or health care professional if they continue or are bothersome): constipation or diarrhea loss of appetite nausea, vomiting This list may not describe  all possible side effects. Call your doctor for medical advice about side effects. You may report side effects to FDA at 1-800-FDA-1088. Where should I keep my medication? This drug is given in a hospital or clinic and will not be stored at home. NOTE: This sheet is a summary. It may not cover all possible information. If you have questions about this medicine, talk to your doctor, pharmacist, or health care provider.  2022 Elsevier/Gold Standard (2007-09-19 00:00:00) Oxaliplatin Injection What is this medication? OXALIPLATIN (ox AL i PLA tin) is a chemotherapy drug. It targets fast dividing cells, like cancer cells, and causes these cells to die. This medicine is used to treat cancers of the colon and rectum, and many other cancers. This medicine may be used for other purposes; ask your health care provider or pharmacist if you have questions. COMMON BRAND NAME(S): Eloxatin What should I tell my care team before I take this medication? They need to know if you have any of these conditions: heart disease history of irregular heartbeat liver disease low blood counts, like white cells, platelets, or red blood cells lung or breathing disease, like asthma take medicines that treat or prevent blood clots tingling of the  fingers or toes, or other nerve disorder an unusual or allergic reaction to oxaliplatin, other chemotherapy, other medicines, foods, dyes, or preservatives pregnant or trying to get pregnant breast-feeding How should I use this medication? This drug is given as an infusion into a vein. It is administered in a hospital or clinic by a specially trained health care professional. Talk to your pediatrician regarding the use of this medicine in children. Special care may be needed. Overdosage: If you think you have taken too much of this medicine contact a poison control center or emergency room at once. NOTE: This medicine is only for you. Do not share this medicine with  others. What if I miss a dose? It is important not to miss a dose. Call your doctor or health care professional if you are unable to keep an appointment. What may interact with this medication? Do not take this medicine with any of the following medications: cisapride dronedarone pimozide thioridazine This medicine may also interact with the following medications: aspirin and aspirin-like medicines certain medicines that treat or prevent blood clots like warfarin, apixaban, dabigatran, and rivaroxaban cisplatin cyclosporine diuretics medicines for infection like acyclovir, adefovir, amphotericin B, bacitracin, cidofovir, foscarnet, ganciclovir, gentamicin, pentamidine, vancomycin NSAIDs, medicines for pain and inflammation, like ibuprofen or naproxen other medicines that prolong the QT interval (an abnormal heart rhythm) pamidronate zoledronic acid This list may not describe all possible interactions. Give your health care provider a list of all the medicines, herbs, non-prescription drugs, or dietary supplements you use. Also tell them if you smoke, drink alcohol, or use illegal drugs. Some items may interact with your medicine. What should I watch for while using this medication? Your condition will be monitored carefully while you are receiving this medicine. You may need blood work done while you are taking this medicine. This medicine may make you feel generally unwell. This is not uncommon as chemotherapy can affect healthy cells as well as cancer cells. Report any side effects. Continue your course of treatment even though you feel ill unless your healthcare professional tells you to stop. This medicine can make you more sensitive to cold. Do not drink cold drinks or use ice. Cover exposed skin before coming in contact with cold temperatures or cold objects. When out in cold weather wear warm clothing and cover your mouth and nose to warm the air that goes into your lungs. Tell your  doctor if you get sensitive to the cold. Do not become pregnant while taking this medicine or for 9 months after stopping it. Women should inform their health care professional if they wish to become pregnant or think they might be pregnant. Men should not father a child while taking this medicine and for 6 months after stopping it. There is potential for serious side effects to an unborn child. Talk to your health care professional for more information. Do not breast-feed a child while taking this medicine or for 3 months after stopping it. This medicine has caused ovarian failure in some women. This medicine may make it more difficult to get pregnant. Talk to your health care professional if you are concerned about your fertility. This medicine has caused decreased sperm counts in some men. This may make it more difficult to father a child. Talk to your health care professional if you are concerned about your fertility. This medicine may increase your risk of getting an infection. Call your health care professional for advice if you get a fever, chills, or sore throat, or  other symptoms of a cold or flu. Do not treat yourself. Try to avoid being around people who are sick. Avoid taking medicines that contain aspirin, acetaminophen, ibuprofen, naproxen, or ketoprofen unless instructed by your health care professional. These medicines may hide a fever. Be careful brushing or flossing your teeth or using a toothpick because you may get an infection or bleed more easily. If you have any dental work done, tell your dentist you are receiving this medicine. What side effects may I notice from receiving this medication? Side effects that you should report to your doctor or health care professional as soon as possible: allergic reactions like skin rash, itching or hives, swelling of the face, lips, or tongue breathing problems cough low blood counts - this medicine may decrease the number of white blood cells,  red blood cells, and platelets. You may be at increased risk for infections and bleeding nausea, vomiting pain, redness, or irritation at site where injected pain, tingling, numbness in the hands or feet signs and symptoms of bleeding such as bloody or black, tarry stools; red or dark brown urine; spitting up blood or brown material that looks like coffee grounds; red spots on the skin; unusual bruising or bleeding from the eyes, gums, or nose signs and symptoms of a dangerous change in heartbeat or heart rhythm like chest pain; dizziness; fast, irregular heartbeat; palpitations; feeling faint or lightheaded; falls signs and symptoms of infection like fever; chills; cough; sore throat; pain or trouble passing urine signs and symptoms of liver injury like dark yellow or brown urine; general ill feeling or flu-like symptoms; light-colored stools; loss of appetite; nausea; right upper belly pain; unusually weak or tired; yellowing of the eyes or skin signs and symptoms of low red blood cells or anemia such as unusually weak or tired; feeling faint or lightheaded; falls signs and symptoms of muscle injury like dark urine; trouble passing urine or change in the amount of urine; unusually weak or tired; muscle pain; back pain Side effects that usually do not require medical attention (report to your doctor or health care professional if they continue or are bothersome): changes in taste diarrhea gas hair loss loss of appetite mouth sores This list may not describe all possible side effects. Call your doctor for medical advice about side effects. You may report side effects to FDA at 1-800-FDA-1088. Where should I keep my medication? This drug is given in a hospital or clinic and will not be stored at home. NOTE: This sheet is a summary. It may not cover all possible information. If you have questions about this medicine, talk to your doctor, pharmacist, or health care provider.  2022 Elsevier/Gold  Standard (2020-11-30 00:00:00)    To help prevent nausea and vomiting after your treatment, we encourage you to take your nausea medication as directed.  BELOW ARE SYMPTOMS THAT SHOULD BE REPORTED IMMEDIATELY: *FEVER GREATER THAN 100.4 F (38 C) OR HIGHER *CHILLS OR SWEATING *NAUSEA AND VOMITING THAT IS NOT CONTROLLED WITH YOUR NAUSEA MEDICATION *UNUSUAL SHORTNESS OF BREATH *UNUSUAL BRUISING OR BLEEDING *URINARY PROBLEMS (pain or burning when urinating, or frequent urination) *BOWEL PROBLEMS (unusual diarrhea, constipation, pain near the anus) TENDERNESS IN MOUTH AND THROAT WITH OR WITHOUT PRESENCE OF ULCERS (sore throat, sores in mouth, or a toothache) UNUSUAL RASH, SWELLING OR PAIN  UNUSUAL VAGINAL DISCHARGE OR ITCHING   Items with * indicate a potential emergency and should be followed up as soon as possible or go to the Emergency Department if any  problems should occur.  Please show the CHEMOTHERAPY ALERT CARD or IMMUNOTHERAPY ALERT CARD at check-in to the Emergency Department and triage nurse.  Should you have questions after your visit or need to cancel or reschedule your appointment, please contact [facility] [place] AT [place]  [phone] and follow the prompts.  Office hours are 8:00 a.m. to 4:30 p.m. Monday - Friday. Please note that voicemails left after 4:00 p.m. may not be returned until the following business day.  We are closed weekends and major holidays. You have access to a nurse at all times for urgent questions. Please call the main number to the clinic [phone] and follow the prompts.  For any non-urgent questions, you may also contact your provider using MyChart. We now offer e-Visits for anyone 36 and older to request care online for non-urgent symptoms. For details visit [email or web address].   Also download the MyChart app! Go to the app store, search "MyChart", open the app, select [facility], and log in with your MyChart username and  password.  Due to Covid, a mask is required upon entering the hospital/clinic. If you do not have a mask, one will be given to you upon arrival. For doctor visits, patients may have 1 support person aged 84 or older with them. For treatment visits, patients cannot have anyone with them due to current Covid guidelines and our immunocompromised population.

## 2021-04-14 NOTE — Telephone Encounter (Signed)
Referral still in process and is currently in Medicaid review.  Medicaid updates them on Thursdays regarding referrals so they should be able to get him scheduled tomorrow.

## 2021-04-15 ENCOUNTER — Inpatient Hospital Stay: Payer: Medicaid Other

## 2021-04-15 ENCOUNTER — Other Ambulatory Visit: Payer: Self-pay

## 2021-04-15 VITALS — BP 119/74 | HR 54 | Temp 97.0°F

## 2021-04-15 DIAGNOSIS — C20 Malignant neoplasm of rectum: Secondary | ICD-10-CM

## 2021-04-15 DIAGNOSIS — Z5111 Encounter for antineoplastic chemotherapy: Secondary | ICD-10-CM | POA: Diagnosis not present

## 2021-04-15 MED ORDER — HEPARIN SOD (PORK) LOCK FLUSH 100 UNIT/ML IV SOLN
INTRAVENOUS | Status: AC
  Administered 2021-04-15: 500 [IU]
  Filled 2021-04-15: qty 5

## 2021-04-15 MED ORDER — SODIUM CHLORIDE 0.9% FLUSH
10.0000 mL | INTRAVENOUS | Status: DC | PRN
  Administered 2021-04-15: 10 mL
  Filled 2021-04-15: qty 10

## 2021-04-15 MED ORDER — HEPARIN SOD (PORK) LOCK FLUSH 100 UNIT/ML IV SOLN
500.0000 [IU] | Freq: Once | INTRAVENOUS | Status: DC | PRN
  Filled 2021-04-15: qty 5

## 2021-04-18 NOTE — Telephone Encounter (Signed)
Pt is scheduled:   04/25/2021 Initial consult Oncology Edison Nasuti Sharren Bridge, MD

## 2021-04-20 ENCOUNTER — Inpatient Hospital Stay: Payer: Medicaid Other

## 2021-04-20 ENCOUNTER — Other Ambulatory Visit: Payer: Self-pay

## 2021-04-20 VITALS — BP 107/73 | HR 57 | Temp 97.8°F | Resp 17

## 2021-04-20 DIAGNOSIS — Z5111 Encounter for antineoplastic chemotherapy: Secondary | ICD-10-CM | POA: Diagnosis not present

## 2021-04-20 DIAGNOSIS — C20 Malignant neoplasm of rectum: Secondary | ICD-10-CM

## 2021-04-20 DIAGNOSIS — E611 Iron deficiency: Secondary | ICD-10-CM

## 2021-04-20 DIAGNOSIS — R197 Diarrhea, unspecified: Secondary | ICD-10-CM

## 2021-04-20 LAB — BASIC METABOLIC PANEL
Anion gap: 5 (ref 5–15)
BUN: 14 mg/dL (ref 8–23)
CO2: 27 mmol/L (ref 22–32)
Calcium: 9 mg/dL (ref 8.9–10.3)
Chloride: 105 mmol/L (ref 98–111)
Creatinine, Ser: 0.69 mg/dL (ref 0.61–1.24)
GFR, Estimated: >60 mL/min (ref >60)
Glucose, Bld: 104 mg/dL — ABNORMAL HIGH (ref 70–99)
Potassium: 3.8 mmol/L (ref 3.5–5.1)
Sodium: 137 mmol/L (ref 135–145)

## 2021-04-20 LAB — CBC WITH DIFFERENTIAL/PLATELET
Abs Immature Granulocytes: 0.01 10*3/uL (ref 0.00–0.07)
Basophils Absolute: 0.1 10*3/uL (ref 0.0–0.1)
Basophils Relative: 1 %
Eosinophils Absolute: 1.5 10*3/uL — ABNORMAL HIGH (ref 0.0–0.5)
Eosinophils Relative: 25 %
HCT: 34.9 % — ABNORMAL LOW (ref 39.0–52.0)
Hemoglobin: 12.2 g/dL — ABNORMAL LOW (ref 13.0–17.0)
Immature Granulocytes: 0 %
Lymphocytes Relative: 15 %
Lymphs Abs: 0.9 10*3/uL (ref 0.7–4.0)
MCH: 32.8 pg (ref 26.0–34.0)
MCHC: 35 g/dL (ref 30.0–36.0)
MCV: 93.8 fL (ref 80.0–100.0)
Monocytes Absolute: 0.4 10*3/uL (ref 0.1–1.0)
Monocytes Relative: 6 %
Neutro Abs: 3.2 10*3/uL (ref 1.7–7.7)
Neutrophils Relative %: 53 %
Platelets: 141 10*3/uL — ABNORMAL LOW (ref 150–400)
RBC: 3.72 MIL/uL — ABNORMAL LOW (ref 4.22–5.81)
RDW: 12.9 % (ref 11.5–15.5)
WBC: 5.9 10*3/uL (ref 4.0–10.5)
nRBC: 0 % (ref 0.0–0.2)

## 2021-04-20 LAB — MAGNESIUM: Magnesium: 1.9 mg/dL (ref 1.7–2.4)

## 2021-04-20 MED ORDER — SODIUM CHLORIDE 0.9 % IV SOLN
Freq: Once | INTRAVENOUS | Status: AC
  Filled 2021-04-20: qty 250

## 2021-04-20 MED ORDER — SODIUM CHLORIDE 0.9% FLUSH
10.0000 mL | Freq: Once | INTRAVENOUS | Status: AC | PRN
  Administered 2021-04-20: 09:00:00 10 mL
  Filled 2021-04-20: qty 10

## 2021-04-20 MED ORDER — HEPARIN SOD (PORK) LOCK FLUSH 100 UNIT/ML IV SOLN
500.0000 [IU] | Freq: Once | INTRAVENOUS | Status: AC | PRN
  Administered 2021-04-20: 10:00:00 500 [IU]
  Filled 2021-04-20: qty 5

## 2021-04-20 NOTE — Progress Notes (Signed)
Pt here for labs/IVF's. He wore the chemo pump for the first time on 04/13/21. He reports his stomach hurt and crampy the whole time the pump was on. Pain went away 24 hrs after pump removal. Loose stools started while wearing the pump. He states he has 3 - 4 stools during the day and maybe 1-2 during the night. He isdrinking a lot of liquids. Appetite is declined but he is eating at least 1 meal per day. Receiving 1 L NS over 1 hour. Electrolytes WNL's today.

## 2021-04-27 ENCOUNTER — Inpatient Hospital Stay (HOSPITAL_BASED_OUTPATIENT_CLINIC_OR_DEPARTMENT_OTHER): Payer: Medicaid Other | Admitting: Internal Medicine

## 2021-04-27 ENCOUNTER — Inpatient Hospital Stay: Payer: Medicaid Other | Attending: Radiation Oncology

## 2021-04-27 ENCOUNTER — Other Ambulatory Visit: Payer: Self-pay | Admitting: Internal Medicine

## 2021-04-27 ENCOUNTER — Inpatient Hospital Stay: Payer: Medicaid Other

## 2021-04-27 ENCOUNTER — Other Ambulatory Visit: Payer: Self-pay

## 2021-04-27 DIAGNOSIS — C20 Malignant neoplasm of rectum: Secondary | ICD-10-CM

## 2021-04-27 DIAGNOSIS — R197 Diarrhea, unspecified: Secondary | ICD-10-CM | POA: Diagnosis not present

## 2021-04-27 DIAGNOSIS — Z5111 Encounter for antineoplastic chemotherapy: Secondary | ICD-10-CM | POA: Diagnosis present

## 2021-04-27 DIAGNOSIS — Z79899 Other long term (current) drug therapy: Secondary | ICD-10-CM | POA: Insufficient documentation

## 2021-04-27 LAB — CBC WITH DIFFERENTIAL/PLATELET
Abs Immature Granulocytes: 0.01 10*3/uL (ref 0.00–0.07)
Basophils Absolute: 0 10*3/uL (ref 0.0–0.1)
Basophils Relative: 1 %
Eosinophils Absolute: 0.7 10*3/uL — ABNORMAL HIGH (ref 0.0–0.5)
Eosinophils Relative: 13 %
HCT: 32.5 % — ABNORMAL LOW (ref 39.0–52.0)
Hemoglobin: 11.5 g/dL — ABNORMAL LOW (ref 13.0–17.0)
Immature Granulocytes: 0 %
Lymphocytes Relative: 11 %
Lymphs Abs: 0.6 10*3/uL — ABNORMAL LOW (ref 0.7–4.0)
MCH: 33 pg (ref 26.0–34.0)
MCHC: 35.4 g/dL (ref 30.0–36.0)
MCV: 93.1 fL (ref 80.0–100.0)
Monocytes Absolute: 0.7 10*3/uL (ref 0.1–1.0)
Monocytes Relative: 13 %
Neutro Abs: 3.3 10*3/uL (ref 1.7–7.7)
Neutrophils Relative %: 62 %
Platelets: 131 10*3/uL — ABNORMAL LOW (ref 150–400)
RBC: 3.49 MIL/uL — ABNORMAL LOW (ref 4.22–5.81)
RDW: 13.2 % (ref 11.5–15.5)
WBC: 5.3 10*3/uL (ref 4.0–10.5)
nRBC: 0 % (ref 0.0–0.2)

## 2021-04-27 LAB — COMPREHENSIVE METABOLIC PANEL
ALT: 11 U/L (ref 0–44)
AST: 19 U/L (ref 15–41)
Albumin: 4.2 g/dL (ref 3.5–5.0)
Alkaline Phosphatase: 76 U/L (ref 38–126)
Anion gap: 9 (ref 5–15)
BUN: 8 mg/dL (ref 8–23)
CO2: 25 mmol/L (ref 22–32)
Calcium: 9 mg/dL (ref 8.9–10.3)
Chloride: 103 mmol/L (ref 98–111)
Creatinine, Ser: 0.84 mg/dL (ref 0.61–1.24)
GFR, Estimated: >60 mL/min (ref >60)
Glucose, Bld: 98 mg/dL (ref 70–99)
Potassium: 3.7 mmol/L (ref 3.5–5.1)
Sodium: 137 mmol/L (ref 135–145)
Total Bilirubin: 0.4 mg/dL (ref 0.3–1.2)
Total Protein: 6.7 g/dL (ref 6.5–8.1)

## 2021-04-27 MED ORDER — OXALIPLATIN CHEMO INJECTION 100 MG/20ML
85.0000 mg/m2 | Freq: Once | INTRAVENOUS | Status: AC
  Administered 2021-04-27: 140 mg via INTRAVENOUS
  Filled 2021-04-27: qty 20

## 2021-04-27 MED ORDER — LEUCOVORIN CALCIUM INJECTION 350 MG
392.0000 mg/m2 | Freq: Once | INTRAVENOUS | Status: AC
  Administered 2021-04-27: 650 mg via INTRAVENOUS
  Filled 2021-04-27: qty 32.5

## 2021-04-27 MED ORDER — SODIUM CHLORIDE 0.9 % IV SOLN
2400.0000 mg/m2 | INTRAVENOUS | Status: DC
  Administered 2021-04-27: 4000 mg via INTRAVENOUS
  Filled 2021-04-27: qty 80

## 2021-04-27 MED ORDER — DEXTROSE 5 % IV SOLN
Freq: Once | INTRAVENOUS | Status: AC
  Filled 2021-04-27: qty 250

## 2021-04-27 MED ORDER — PALONOSETRON HCL INJECTION 0.25 MG/5ML
0.2500 mg | Freq: Once | INTRAVENOUS | Status: AC
  Administered 2021-04-27: 0.25 mg via INTRAVENOUS
  Filled 2021-04-27: qty 5

## 2021-04-27 MED ORDER — SODIUM CHLORIDE 0.9 % IV SOLN
10.0000 mg | Freq: Once | INTRAVENOUS | Status: AC
  Administered 2021-04-27: 10 mg via INTRAVENOUS
  Filled 2021-04-27: qty 10

## 2021-04-27 MED ORDER — BUSPIRONE HCL 5 MG PO TABS: 5.0000 mg | ORAL_TABLET | Freq: Three times a day (TID) | ORAL | 1 refills | Status: DC
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
## 2021-04-27 NOTE — Patient Instructions (Signed)
[facility] CANCER CTR AT [place]  Discharge Instructions: Thank you for choosing [facility] to provide your oncology and hematology care.  If you have a lab appointment with the [facility], please go directly to the [facility] and check in at the registration area.  Wear comfortable clothing and clothing appropriate for easy access to any Portacath or PICC line.   We strive to give you quality time with your provider. You may need to reschedule your appointment if you arrive late (15 or more minutes).  Arriving late affects you and other patients whose appointments are after yours.  Also, if you miss three or more appointments without notifying the office, you may be dismissed from the clinic at the providers discretion.      For prescription refill requests, have your pharmacy contact our office and allow 72 hours for refills to be completed.    Today you received the following chemotherapy and/or immunotherapy agents : Folfox   To help prevent nausea and vomiting after your treatment, we encourage you to take your nausea medication as directed.  BELOW ARE SYMPTOMS THAT SHOULD BE REPORTED IMMEDIATELY: *FEVER GREATER THAN 100.4 F (38 C) OR HIGHER *CHILLS OR SWEATING *NAUSEA AND VOMITING THAT IS NOT CONTROLLED WITH YOUR NAUSEA MEDICATION *UNUSUAL SHORTNESS OF BREATH *UNUSUAL BRUISING OR BLEEDING *URINARY PROBLEMS (pain or burning when urinating, or frequent urination) *BOWEL PROBLEMS (unusual diarrhea, constipation, pain near the anus) TENDERNESS IN MOUTH AND THROAT WITH OR WITHOUT PRESENCE OF ULCERS (sore throat, sores in mouth, or a toothache) UNUSUAL RASH, SWELLING OR PAIN  UNUSUAL VAGINAL DISCHARGE OR ITCHING   Items with * indicate a potential emergency and should be followed up as soon as possible or go to the Emergency Department if any problems should occur.  Please show the CHEMOTHERAPY ALERT CARD or IMMUNOTHERAPY ALERT CARD at check-in to the  Emergency Department and triage nurse.  Should you have questions after your visit or need to cancel or reschedule your appointment, please contact [facility] CANCER [place] AT [place]  [phone] and follow the prompts.  Office hours are 8:00 a.m. to 4:30 p.m. Monday - Friday. Please note that voicemails left after 4:00 p.m. may not be returned until the following business day.  We are closed weekends and major holidays. You have access to a nurse at all times for urgent questions. Please call the main number to the clinic [phone] and follow the prompts.  For any non-urgent questions, you may also contact your provider using MyChart. We now offer e-Visits for anyone 16 and older to request care online for non-urgent symptoms. For details visit [email or web address].   Also download the MyChart app! Go to the app store, search "MyChart", open the app, select [facility], and log in with your MyChart username and password.  Due to Covid, a mask is required upon entering the hospital/clinic. If you do not have a mask, one will be given to you upon arrival. For doctor visits, patients may have 1 support person aged 24 or older with them. For treatment visits, patients cannot have anyone with them due to current Covid guidelines and our immunocompromised population.

## 2021-04-27 NOTE — Progress Notes (Signed)
Patient reports a lot of diarrhea all through the night and day after chemo.

## 2021-04-27 NOTE — Assessment & Plan Note (Addendum)
#   Rectal cancer-  locally advanced disease. MRI- OCT 2022- T4bN2; ? M [see below].  s/p  concurrent chemoradiation with Xeloda. Radiation [finished DEC 12th, 2022].  March 23, 2021-CT scan shows Continued circumferential wall thickening in the lower rectum suspicious for tumor. No pathologic adenopathy identified. See discussion below re: Lung nodule. Continue with neo-adjuvant  FOLFOX chemotherapy every 2 weeks x 8 treatments [however if patient tolerating chemotherapy poorly/I think is reasonable to hold chemotherapy-and proceed with definitive therapy]. S/p  surgery evaluation at [facility] jan 2023.    # Continue with TNT- neoadjuvant  FOLFOX #2/8; Labs today reviewed;  acceptable for treatment today. Again discussed re: potential side effects including cold sensitivity. Labs today reviewed;  acceptable for treatment today.   # Diarrhea- G-2;from 5 FU-  recommend Lomotil as needed- prn.   # PET 2022-LEFT lower lobe nodule with increased metabolic activity suspicious for either metastatic colorectal neoplasm or second primary.  Mildly reduced in size, currently 0.8 by 0.6 cm [ formerly 1.1 by 0.9 cm]-monitor for now.  Will monitor with subsequent imaging.  # Anxiety: poorly controlled; continue the Klonipin to 1 mg BID [[name]]. ADD buspar 5mg  TID. Continue Klonipin for now.   #Iron deficient anemia hemoglobin 12-secondary to chronic GI bleed iron deficiency;HOLD off venofer-as hemoglobin is stable.  Lucianne Lei pt # DISPOSITION: # Proceed with FOLFOX chemo- # 1 week- labs- cbc/bmp- Possible 1 lit IVFs.  # Follow up 2 weeks- MD; labs- cbc/cmp; FOLFOX; pump off d-2 days later---Dr.B

## 2021-04-28 ENCOUNTER — Encounter: Payer: Self-pay | Admitting: Internal Medicine

## 2021-04-29 ENCOUNTER — Inpatient Hospital Stay: Payer: Medicaid Other

## 2021-04-29 ENCOUNTER — Other Ambulatory Visit: Payer: Self-pay

## 2021-04-29 VITALS — BP 98/54 | HR 61 | Temp 97.9°F | Resp 18

## 2021-04-29 DIAGNOSIS — Z5111 Encounter for antineoplastic chemotherapy: Secondary | ICD-10-CM | POA: Diagnosis not present

## 2021-04-29 DIAGNOSIS — C20 Malignant neoplasm of rectum: Secondary | ICD-10-CM

## 2021-04-29 MED ORDER — HEPARIN SOD (PORK) LOCK FLUSH 100 UNIT/ML IV SOLN
500.0000 [IU] | Freq: Once | INTRAVENOUS | Status: AC | PRN
  Administered 2021-04-29: 500 [IU]
  Filled 2021-04-29: qty 5

## 2021-04-29 MED ORDER — SODIUM CHLORIDE 0.9% FLUSH
10.0000 mL | INTRAVENOUS | Status: DC | PRN
  Administered 2021-04-29: 10 mL
  Filled 2021-04-29: qty 10

## 2021-05-05 ENCOUNTER — Inpatient Hospital Stay: Payer: Medicaid Other

## 2021-05-05 ENCOUNTER — Other Ambulatory Visit: Payer: Self-pay

## 2021-05-05 VITALS — BP 108/71 | HR 64 | Temp 97.8°F | Resp 17

## 2021-05-05 DIAGNOSIS — E611 Iron deficiency: Secondary | ICD-10-CM

## 2021-05-05 DIAGNOSIS — C20 Malignant neoplasm of rectum: Secondary | ICD-10-CM

## 2021-05-05 DIAGNOSIS — Z5111 Encounter for antineoplastic chemotherapy: Secondary | ICD-10-CM | POA: Diagnosis not present

## 2021-05-05 LAB — CBC WITH DIFFERENTIAL/PLATELET
Abs Immature Granulocytes: 0.03 10*3/uL (ref 0.00–0.07)
Basophils Absolute: 0 10*3/uL (ref 0.0–0.1)
Basophils Relative: 1 %
Eosinophils Absolute: 0.7 10*3/uL — ABNORMAL HIGH (ref 0.0–0.5)
Eosinophils Relative: 21 %
HCT: 32.4 % — ABNORMAL LOW (ref 39.0–52.0)
Hemoglobin: 11.3 g/dL — ABNORMAL LOW (ref 13.0–17.0)
Immature Granulocytes: 1 %
Lymphocytes Relative: 32 %
Lymphs Abs: 1.1 10*3/uL (ref 0.7–4.0)
MCH: 32.2 pg (ref 26.0–34.0)
MCHC: 34.9 g/dL (ref 30.0–36.0)
MCV: 92.3 fL (ref 80.0–100.0)
Monocytes Absolute: 0.4 10*3/uL (ref 0.1–1.0)
Monocytes Relative: 11 %
Neutro Abs: 1.2 10*3/uL — ABNORMAL LOW (ref 1.7–7.7)
Neutrophils Relative %: 34 %
Platelets: 168 10*3/uL (ref 150–400)
RBC: 3.51 MIL/uL — ABNORMAL LOW (ref 4.22–5.81)
RDW: 12.7 % (ref 11.5–15.5)
WBC: 3.5 10*3/uL — ABNORMAL LOW (ref 4.0–10.5)
nRBC: 0 % (ref 0.0–0.2)

## 2021-05-05 LAB — COMPREHENSIVE METABOLIC PANEL
ALT: 15 U/L (ref 0–44)
AST: 27 U/L (ref 15–41)
Albumin: 4.1 g/dL (ref 3.5–5.0)
Alkaline Phosphatase: 70 U/L (ref 38–126)
Anion gap: 6 (ref 5–15)
BUN: 11 mg/dL (ref 8–23)
CO2: 27 mmol/L (ref 22–32)
Calcium: 9.1 mg/dL (ref 8.9–10.3)
Chloride: 105 mmol/L (ref 98–111)
Creatinine, Ser: 0.74 mg/dL (ref 0.61–1.24)
GFR, Estimated: >60 mL/min (ref >60)
Glucose, Bld: 79 mg/dL (ref 70–99)
Potassium: 3.8 mmol/L (ref 3.5–5.1)
Sodium: 138 mmol/L (ref 135–145)
Total Bilirubin: 0.2 mg/dL — ABNORMAL LOW (ref 0.3–1.2)
Total Protein: 7.1 g/dL (ref 6.5–8.1)

## 2021-05-05 MED ORDER — HEPARIN SOD (PORK) LOCK FLUSH 100 UNIT/ML IV SOLN
500.0000 [IU] | Freq: Once | INTRAVENOUS | Status: AC
  Administered 2021-05-05: 500 [IU] via INTRAVENOUS
  Filled 2021-05-05: qty 5

## 2021-05-05 MED ORDER — SODIUM CHLORIDE 0.9% FLUSH
10.0000 mL | INTRAVENOUS | Status: AC | PRN
  Administered 2021-05-05: 10 mL via INTRAVENOUS
  Filled 2021-05-05: qty 10

## 2021-05-05 MED ORDER — DEXAMETHASONE SODIUM PHOSPHATE 10 MG/ML IJ SOLN
10.0000 mg | Freq: Once | INTRAMUSCULAR | Status: AC
  Administered 2021-05-05: 10 mg via INTRAVENOUS

## 2021-05-05 MED ORDER — SODIUM CHLORIDE 0.9 % IV SOLN
Freq: Once | INTRAVENOUS | Status: AC
  Filled 2021-05-05: qty 250

## 2021-05-11 ENCOUNTER — Inpatient Hospital Stay (HOSPITAL_BASED_OUTPATIENT_CLINIC_OR_DEPARTMENT_OTHER): Payer: Medicaid Other | Admitting: Internal Medicine

## 2021-05-11 ENCOUNTER — Telehealth: Payer: Self-pay

## 2021-05-11 ENCOUNTER — Encounter: Payer: Self-pay | Admitting: Internal Medicine

## 2021-05-11 ENCOUNTER — Inpatient Hospital Stay: Payer: Medicaid Other

## 2021-05-11 ENCOUNTER — Other Ambulatory Visit: Payer: Self-pay

## 2021-05-11 DIAGNOSIS — R110 Nausea: Secondary | ICD-10-CM

## 2021-05-11 DIAGNOSIS — Z5111 Encounter for antineoplastic chemotherapy: Secondary | ICD-10-CM | POA: Diagnosis not present

## 2021-05-11 DIAGNOSIS — C20 Malignant neoplasm of rectum: Secondary | ICD-10-CM

## 2021-05-11 LAB — COMPREHENSIVE METABOLIC PANEL
ALT: 25 U/L (ref 0–44)
AST: 36 U/L (ref 15–41)
Albumin: 4.2 g/dL (ref 3.5–5.0)
Alkaline Phosphatase: 76 U/L (ref 38–126)
Anion gap: 3 — ABNORMAL LOW (ref 5–15)
BUN: 12 mg/dL (ref 8–23)
CO2: 30 mmol/L (ref 22–32)
Calcium: 9 mg/dL (ref 8.9–10.3)
Chloride: 102 mmol/L (ref 98–111)
Creatinine, Ser: 0.73 mg/dL (ref 0.61–1.24)
GFR, Estimated: >60 mL/min (ref >60)
Glucose, Bld: 103 mg/dL — ABNORMAL HIGH (ref 70–99)
Potassium: 3.8 mmol/L (ref 3.5–5.1)
Sodium: 135 mmol/L (ref 135–145)
Total Bilirubin: 0.2 mg/dL — ABNORMAL LOW (ref 0.3–1.2)
Total Protein: 6.9 g/dL (ref 6.5–8.1)

## 2021-05-11 LAB — CBC WITH DIFFERENTIAL/PLATELET
Abs Immature Granulocytes: 0.02 10*3/uL (ref 0.00–0.07)
Basophils Absolute: 0 10*3/uL (ref 0.0–0.1)
Basophils Relative: 1 %
Eosinophils Absolute: 0.5 10*3/uL (ref 0.0–0.5)
Eosinophils Relative: 11 %
HCT: 33.8 % — ABNORMAL LOW (ref 39.0–52.0)
Hemoglobin: 11.6 g/dL — ABNORMAL LOW (ref 13.0–17.0)
Immature Granulocytes: 1 %
Lymphocytes Relative: 18 %
Lymphs Abs: 0.8 10*3/uL (ref 0.7–4.0)
MCH: 32.3 pg (ref 26.0–34.0)
MCHC: 34.3 g/dL (ref 30.0–36.0)
MCV: 94.2 fL (ref 80.0–100.0)
Monocytes Absolute: 0.5 10*3/uL (ref 0.1–1.0)
Monocytes Relative: 13 %
Neutro Abs: 2.3 10*3/uL (ref 1.7–7.7)
Neutrophils Relative %: 56 %
Platelets: 143 10*3/uL — ABNORMAL LOW (ref 150–400)
RBC: 3.59 MIL/uL — ABNORMAL LOW (ref 4.22–5.81)
RDW: 13.8 % (ref 11.5–15.5)
WBC: 4.2 10*3/uL (ref 4.0–10.5)
nRBC: 0 % (ref 0.0–0.2)

## 2021-05-11 MED ORDER — OXALIPLATIN CHEMO INJECTION 100 MG/20ML
85.0000 mg/m2 | Freq: Once | INTRAVENOUS | Status: AC
  Administered 2021-05-11: 140 mg via INTRAVENOUS
  Filled 2021-05-11: qty 10

## 2021-05-11 MED ORDER — LEUCOVORIN CALCIUM INJECTION 350 MG
391.0000 mg/m2 | Freq: Once | INTRAVENOUS | Status: AC
  Administered 2021-05-11: 650 mg via INTRAVENOUS
  Filled 2021-05-11: qty 32.5

## 2021-05-11 MED ORDER — SODIUM CHLORIDE 0.9 % IV SOLN
10.0000 mg | Freq: Once | INTRAVENOUS | Status: AC
  Administered 2021-05-11: 10 mg via INTRAVENOUS
  Filled 2021-05-11: qty 10

## 2021-05-11 MED ORDER — DEXTROSE 5 % IV SOLN
Freq: Once | INTRAVENOUS | Status: AC
  Filled 2021-05-11: qty 250

## 2021-05-11 MED ORDER — SODIUM CHLORIDE 0.9 % IV SOLN
2400.0000 mg/m2 | INTRAVENOUS | Status: DC
  Administered 2021-05-11: 4000 mg via INTRAVENOUS
  Filled 2021-05-11: qty 80

## 2021-05-11 MED ORDER — PALONOSETRON HCL INJECTION 0.25 MG/5ML
0.2500 mg | Freq: Once | INTRAVENOUS | Status: AC
  Administered 2021-05-11: 0.25 mg via INTRAVENOUS
  Filled 2021-05-11: qty 5

## 2021-05-11 NOTE — Telephone Encounter (Signed)
Order/Rx typed in letters section of chart.  Requested documentation faxed to the [facility] at [phone]
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
## 2021-05-11 NOTE — Assessment & Plan Note (Addendum)
#   Rectal cancer-  locally advanced disease. MRI- OCT 2022- T4bN2; ? M [see below].  s/p  concurrent chemoradiation with Xeloda. Radiation [finished DEC 12th, 2022].  March 23, 2021-CT scan shows Continued circumferential wall thickening in the lower rectum suspicious for tumor. No pathologic adenopathy identified. See discussion below re: Lung nodule.  # Continue with neo-adjuvant  FOLFOX chemotherapy every 2 weeks x 8 treatments [however if patient tolerating chemotherapy poorly/I think is reasonable to hold chemotherapy-and proceed with definitive therapy]. S/p  surgery evaluation at [facility] jan 2023.    # Continue with TNT- neoadjuvant  FOLFOX #3/8; Labs today reviewed;  acceptable for treatment today. Again discussed re: potential side effects including cold sensitivity. Labs today reviewed;  acceptable for treatment today. Consider dose reduction if continued weight loss.   # Diarrhea- G-2;from 5 FU-  Continue  Lomotil as needed- prn.   # PET 2022-LEFT lower lobe nodule with increased metabolic activity suspicious for either metastatic colorectal neoplasm or second primary.  Mildly reduced in size, currently 0.8 by 0.6 cm [ formerly 1.1 by 0.9 cm]-monitor for now.  Will monitor with subsequent imaging.  # Weight loss:? Diarrhea vs others-  refer to Joli re: weight loss.   # Anxiety: poorly controlled; continue the Klonipin to 1 mg BID [[name]; buspar x 3 days discontinued nausea] Continue Klonipin for now- STABLE.   #Iron deficient anemia hemoglobin 12-secondary to chronic GI bleed iron deficiency;HOLD off venofer-as hemoglobin is stable.  Lucianne Lei pt # DISPOSITION: # Proceed with FOLFOX chemo- # 1 week- labs- cbc/bmp- Possible 1 lit IVFs.  # Follow up 2 weeks- MD; labs- cbc/cmp; FOLFOX; pump off d-2 days later---Dr.B

## 2021-05-11 NOTE — Progress Notes (Signed)
Patient reports poor appetite but "makes himself eat".  Eats 2 meals a day as well as 3 meal replacement shakes a day.  Has a 5 lb weight loss in the last 2 weeks.  Took Buspar for 3 days but made him feel like mouth was "foamy" so he stopped taking.  Does have diarrhea after treatment that improves after taking Lomotil.    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

## 2021-05-11 NOTE — Telephone Encounter (Signed)
-----   Message from Jennet Maduro, [place] sent at 05/11/2021  2:49 PM EST ----- Dr Jacinto Reap and Nira Conn,  [name] [name] currently has [place] Medicaid insurance and they are now covering adult oral nutrition supplements.  Prior authorization can be faxed to [place] (DME) for approval at [phone].  The following items should be faxed.  Demographics Rx (Boost Plus TID, likes chocolate) Last MD notes  If approved [facility] will ship supplements to patient's doorstep at no charge.  I have discussed this with the patient and he is agreeable.  He is aware this could take several weeks for approval.    Please let me know if you have questions.  Thanks, [employer]

## 2021-05-13 ENCOUNTER — Inpatient Hospital Stay: Payer: Medicaid Other

## 2021-05-13 ENCOUNTER — Other Ambulatory Visit: Payer: Self-pay

## 2021-05-13 VITALS — BP 98/67 | HR 76 | Temp 98.0°F | Resp 18

## 2021-05-13 DIAGNOSIS — C20 Malignant neoplasm of rectum: Secondary | ICD-10-CM

## 2021-05-13 DIAGNOSIS — Z5111 Encounter for antineoplastic chemotherapy: Secondary | ICD-10-CM | POA: Diagnosis not present

## 2021-05-13 MED ORDER — HEPARIN SOD (PORK) LOCK FLUSH 100 UNIT/ML IV SOLN
500.0000 [IU] | Freq: Once | INTRAVENOUS | Status: AC | PRN
  Administered 2021-05-13: 500 [IU]
  Filled 2021-05-13: qty 5

## 2021-05-13 MED ORDER — SODIUM CHLORIDE 0.9% FLUSH
10.0000 mL | INTRAVENOUS | Status: DC | PRN
  Administered 2021-05-13: 10 mL
  Filled 2021-05-13: qty 10

## 2021-05-18 ENCOUNTER — Inpatient Hospital Stay: Payer: Medicaid Other

## 2021-05-18 ENCOUNTER — Other Ambulatory Visit: Payer: Self-pay

## 2021-05-18 ENCOUNTER — Other Ambulatory Visit: Payer: Self-pay | Admitting: Internal Medicine

## 2021-05-18 VITALS — BP 110/78 | HR 60 | Temp 96.9°F | Resp 17 | Wt 125.0 lb

## 2021-05-18 DIAGNOSIS — C20 Malignant neoplasm of rectum: Secondary | ICD-10-CM

## 2021-05-18 DIAGNOSIS — E611 Iron deficiency: Secondary | ICD-10-CM

## 2021-05-18 DIAGNOSIS — R531 Weakness: Secondary | ICD-10-CM

## 2021-05-18 DIAGNOSIS — Z5111 Encounter for antineoplastic chemotherapy: Secondary | ICD-10-CM | POA: Diagnosis not present

## 2021-05-18 LAB — CBC WITH DIFFERENTIAL/PLATELET
Abs Immature Granulocytes: 0.01 10*3/uL (ref 0.00–0.07)
Basophils Absolute: 0.1 10*3/uL (ref 0.0–0.1)
Basophils Relative: 2 %
Eosinophils Absolute: 0.3 10*3/uL (ref 0.0–0.5)
Eosinophils Relative: 8 %
HCT: 32.5 % — ABNORMAL LOW (ref 39.0–52.0)
Hemoglobin: 11.1 g/dL — ABNORMAL LOW (ref 13.0–17.0)
Immature Granulocytes: 0 %
Lymphocytes Relative: 20 %
Lymphs Abs: 0.7 10*3/uL (ref 0.7–4.0)
MCH: 32.6 pg (ref 26.0–34.0)
MCHC: 34.2 g/dL (ref 30.0–36.0)
MCV: 95.6 fL (ref 80.0–100.0)
Monocytes Absolute: 0.4 10*3/uL (ref 0.1–1.0)
Monocytes Relative: 11 %
Neutro Abs: 2 10*3/uL (ref 1.7–7.7)
Neutrophils Relative %: 59 %
Platelets: 94 10*3/uL — ABNORMAL LOW (ref 150–400)
RBC: 3.4 MIL/uL — ABNORMAL LOW (ref 4.22–5.81)
RDW: 13.5 % (ref 11.5–15.5)
WBC: 3.4 10*3/uL — ABNORMAL LOW (ref 4.0–10.5)
nRBC: 0 % (ref 0.0–0.2)

## 2021-05-18 MED ORDER — HEPARIN SOD (PORK) LOCK FLUSH 100 UNIT/ML IV SOLN
500.0000 [IU] | Freq: Once | INTRAVENOUS | Status: AC | PRN
  Administered 2021-05-18: 500 [IU]
  Filled 2021-05-18: qty 5

## 2021-05-18 MED ORDER — SODIUM CHLORIDE 0.9 % IV SOLN: Freq: Once | INTRAVENOUS | Status: DC

## 2021-05-18 MED ORDER — DEXAMETHASONE SODIUM PHOSPHATE 10 MG/ML IJ SOLN
10.0000 mg | Freq: Once | INTRAMUSCULAR | Status: AC
  Administered 2021-05-18: 10 mg via INTRAVENOUS

## 2021-05-18 MED ORDER — SODIUM CHLORIDE 0.9 % IV SOLN
Freq: Once | INTRAVENOUS | Status: AC
  Filled 2021-05-18: qty 250

## 2021-05-18 MED ORDER — ONDANSETRON HCL 4 MG/2ML IJ SOLN
8.0000 mg | Freq: Once | INTRAMUSCULAR | Status: AC
  Administered 2021-05-18: 8 mg via INTRAVENOUS
  Filled 2021-05-18: qty 4

## 2021-05-18 MED ORDER — SODIUM CHLORIDE 0.9% FLUSH
10.0000 mL | Freq: Once | INTRAVENOUS | Status: AC | PRN
  Administered 2021-05-18: 10 mL
  Filled 2021-05-18: qty 10

## 2021-05-18 NOTE — Progress Notes (Signed)
Pt received IV hydration with supportive meds today. VSS. Discharged to home at completion.

## 2021-05-25 ENCOUNTER — Encounter: Payer: Self-pay | Admitting: Internal Medicine

## 2021-05-25 ENCOUNTER — Inpatient Hospital Stay: Payer: Medicaid Other

## 2021-05-25 ENCOUNTER — Inpatient Hospital Stay (HOSPITAL_BASED_OUTPATIENT_CLINIC_OR_DEPARTMENT_OTHER): Payer: Medicaid Other | Admitting: Hospice and Palliative Medicine

## 2021-05-25 ENCOUNTER — Inpatient Hospital Stay: Payer: Medicaid Other | Attending: Internal Medicine

## 2021-05-25 ENCOUNTER — Other Ambulatory Visit: Payer: Self-pay

## 2021-05-25 ENCOUNTER — Inpatient Hospital Stay (HOSPITAL_BASED_OUTPATIENT_CLINIC_OR_DEPARTMENT_OTHER): Payer: Medicaid Other | Admitting: Internal Medicine

## 2021-05-25 DIAGNOSIS — C7800 Secondary malignant neoplasm of unspecified lung: Secondary | ICD-10-CM | POA: Diagnosis not present

## 2021-05-25 DIAGNOSIS — Z79899 Other long term (current) drug therapy: Secondary | ICD-10-CM | POA: Insufficient documentation

## 2021-05-25 DIAGNOSIS — D6959 Other secondary thrombocytopenia: Secondary | ICD-10-CM | POA: Insufficient documentation

## 2021-05-25 DIAGNOSIS — Z87891 Personal history of nicotine dependence: Secondary | ICD-10-CM | POA: Insufficient documentation

## 2021-05-25 DIAGNOSIS — T451X5A Adverse effect of antineoplastic and immunosuppressive drugs, initial encounter: Secondary | ICD-10-CM | POA: Diagnosis not present

## 2021-05-25 DIAGNOSIS — C20 Malignant neoplasm of rectum: Secondary | ICD-10-CM

## 2021-05-25 DIAGNOSIS — D649 Anemia, unspecified: Secondary | ICD-10-CM | POA: Insufficient documentation

## 2021-05-25 DIAGNOSIS — Z5111 Encounter for antineoplastic chemotherapy: Secondary | ICD-10-CM | POA: Diagnosis not present

## 2021-05-25 DIAGNOSIS — F419 Anxiety disorder, unspecified: Secondary | ICD-10-CM | POA: Diagnosis not present

## 2021-05-25 LAB — COMPREHENSIVE METABOLIC PANEL
ALT: 28 U/L (ref 0–44)
AST: 32 U/L (ref 15–41)
Albumin: 4.1 g/dL (ref 3.5–5.0)
Alkaline Phosphatase: 71 U/L (ref 38–126)
Anion gap: 5 (ref 5–15)
BUN: 11 mg/dL (ref 8–23)
CO2: 25 mmol/L (ref 22–32)
Calcium: 9 mg/dL (ref 8.9–10.3)
Chloride: 105 mmol/L (ref 98–111)
Creatinine, Ser: 0.72 mg/dL (ref 0.61–1.24)
GFR, Estimated: >60 mL/min (ref >60)
Glucose, Bld: 95 mg/dL (ref 70–99)
Potassium: 4.2 mmol/L (ref 3.5–5.1)
Sodium: 135 mmol/L (ref 135–145)
Total Bilirubin: <0.1 mg/dL — ABNORMAL LOW (ref 0.3–1.2)
Total Protein: 7.1 g/dL (ref 6.5–8.1)

## 2021-05-25 LAB — CBC WITH DIFFERENTIAL/PLATELET
Abs Immature Granulocytes: 0.03 10*3/uL (ref 0.00–0.07)
Basophils Absolute: 0 10*3/uL (ref 0.0–0.1)
Basophils Relative: 1 %
Eosinophils Absolute: 0.5 10*3/uL (ref 0.0–0.5)
Eosinophils Relative: 13 %
HCT: 34.4 % — ABNORMAL LOW (ref 39.0–52.0)
Hemoglobin: 11.8 g/dL — ABNORMAL LOW (ref 13.0–17.0)
Immature Granulocytes: 1 %
Lymphocytes Relative: 21 %
Lymphs Abs: 0.8 10*3/uL (ref 0.7–4.0)
MCH: 32.8 pg (ref 26.0–34.0)
MCHC: 34.3 g/dL (ref 30.0–36.0)
MCV: 95.6 fL (ref 80.0–100.0)
Monocytes Absolute: 0.6 10*3/uL (ref 0.1–1.0)
Monocytes Relative: 17 %
Neutro Abs: 1.9 10*3/uL (ref 1.7–7.7)
Neutrophils Relative %: 47 %
Platelets: 88 10*3/uL — ABNORMAL LOW (ref 150–400)
RBC: 3.6 MIL/uL — ABNORMAL LOW (ref 4.22–5.81)
RDW: 14.6 % (ref 11.5–15.5)
WBC: 3.9 10*3/uL — ABNORMAL LOW (ref 4.0–10.5)
nRBC: 0 % (ref 0.0–0.2)

## 2021-05-25 MED ORDER — DEXTROSE 5 % IV SOLN
Freq: Once | INTRAVENOUS | Status: AC
  Filled 2021-05-25: qty 250

## 2021-05-25 MED ORDER — OXALIPLATIN CHEMO INJECTION 100 MG/20ML
60.0000 mg/m2 | Freq: Once | INTRAVENOUS | Status: AC
  Administered 2021-05-25: 100 mg via INTRAVENOUS
  Filled 2021-05-25: qty 20

## 2021-05-25 MED ORDER — SODIUM CHLORIDE 0.9 % IV SOLN
10.0000 mg | Freq: Once | INTRAVENOUS | Status: AC
  Administered 2021-05-25: 10 mg via INTRAVENOUS
  Filled 2021-05-25: qty 10

## 2021-05-25 MED ORDER — TRAZODONE HCL 50 MG PO TABS: 50.0000 mg | ORAL_TABLET | Freq: Every day | ORAL | 0 refills | Status: DC

## 2021-05-25 MED ORDER — CLONAZEPAM 1 MG PO TABS: 1.0000 mg | ORAL_TABLET | Freq: Two times a day (BID) | ORAL | 0 refills | Status: DC | PRN

## 2021-05-25 MED ORDER — LEUCOVORIN CALCIUM INJECTION 350 MG
391.0000 mg/m2 | Freq: Once | INTRAVENOUS | Status: AC
  Administered 2021-05-25: 650 mg via INTRAVENOUS
  Filled 2021-05-25: qty 32.5

## 2021-05-25 MED ORDER — SODIUM CHLORIDE 0.9 % IV SOLN
2400.0000 mg/m2 | INTRAVENOUS | Status: DC
  Administered 2021-05-25: 4000 mg via INTRAVENOUS
  Filled 2021-05-25: qty 80

## 2021-05-25 MED ORDER — PALONOSETRON HCL INJECTION 0.25 MG/5ML
0.2500 mg | Freq: Once | INTRAVENOUS | Status: AC
  Administered 2021-05-25: 0.25 mg via INTRAVENOUS
  Filled 2021-05-25: qty 5

## 2021-05-25 NOTE — Patient Instructions (Signed)
[facility] AT [place]  Discharge Instructions: ?Thank you for choosing [facility] to provide your oncology and hematology care.  ?If you have a lab appointment with the [facility], please go directly to the [facility] and check in at the registration area. ? ?Wear comfortable clothing and clothing appropriate for easy access to any Portacath or PICC line.  ? ?We strive to give you quality time with your provider. You may need to reschedule your appointment if you arrive late (15 or more minutes).  Arriving late affects you and other patients whose appointments are after yours.  Also, if you miss three or more appointments without notifying the office, you may be dismissed from the clinic at the provider?s discretion.    ?  ?For prescription refill requests, have your pharmacy contact our office and allow 72 hours for refills to be completed.   ? ?Today you received the following chemotherapy and/or immunotherapy agents Oxaliplatin, Leucovorin and Adrucil     ?  ?To help prevent nausea and vomiting after your treatment, we encourage you to take your nausea medication as directed. ? ?BELOW ARE SYMPTOMS THAT SHOULD BE REPORTED IMMEDIATELY: ?*FEVER GREATER THAN 100.4 F (38 ?C) OR HIGHER ?*CHILLS OR SWEATING ?*NAUSEA AND VOMITING THAT IS NOT CONTROLLED WITH YOUR NAUSEA MEDICATION ?*UNUSUAL SHORTNESS OF BREATH ?*UNUSUAL BRUISING OR BLEEDING ?*URINARY PROBLEMS (pain or burning when urinating, or frequent urination) ?*BOWEL PROBLEMS (unusual diarrhea, constipation, pain near the anus) ?TENDERNESS IN MOUTH AND THROAT WITH OR WITHOUT PRESENCE OF ULCERS (sore throat, sores in mouth, or a toothache) ?UNUSUAL RASH, SWELLING OR PAIN  ?UNUSUAL VAGINAL DISCHARGE OR ITCHING  ? ?Items with * indicate a potential emergency and should be followed up as soon as possible or go to the Emergency Department if any problems should occur. ? ?Please show the CHEMOTHERAPY ALERT CARD or IMMUNOTHERAPY  ALERT CARD at check-in to the Emergency Department and triage nurse. ? ?Should you have questions after your visit or need to cancel or reschedule your appointment, please contact [facility] CANCER [place] AT [place]  [phone] and follow the prompts.  Office hours are 8:00 a.m. to 4:30 p.m. Monday - Friday. Please note that voicemails left after 4:00 p.m. may not be returned until the following business day.  We are closed weekends and major holidays. You have access to a nurse at all times for urgent questions. Please call the main number to the clinic [phone] and follow the prompts. ? ?For any non-urgent questions, you may also contact your provider using MyChart. We now offer e-Visits for anyone 18 and older to request care online for non-urgent symptoms. For details visit [email or web address]. ?  ?Also download the MyChart app! Go to the app store, search "MyChart", open the app, select [facility], and log in with your MyChart username and password. ? ?Due to Covid, a mask is required upon entering the hospital/clinic. If you do not have a mask, one will be given to you upon arrival. For doctor visits, patients may have 1 support person aged 29 or older with them. For treatment visits, patients cannot have anyone with them due to current Covid guidelines and our immunocompromised population.  ?
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
## 2021-05-25 NOTE — Progress Notes (Signed)
Nutrition Follow-up: ? ?Patient with locally advanced rectal cancer currently on TNT with folfox.   ? ?Met with patient during infusion.  Patient said that he received the shipment of boost plus but flavor was vanilla.  He has been mixing hershey's syrup in to make them better.  He has been trying to drink 3 a day.  "I want to gain some weight."  He said that he did not eat anything for breakfast this am.  Has been eating grilled cheese sandwichs, egg sandwiches, soups.  Does experience cold sensitivity after treatment ? ? ? ?Medications: reviewed ? ?Labs: reviewed ? ?Anthropometrics:  ? ?Weight 125 lb, stable ? ? ?NUTRITION DIAGNOSIS: Inadequate oral intake stable ? ? ?INTERVENTION:  ?Continue boost plus TID for added nutrition. Will send a message to [facility] requesting chocolate shakes.  Maybe a supply issue that chocolate is not in stock.  Encouraged patient to ask when reorders.  ? ?  ? ?MONITORING, EVALUATION, GOAL: weight trends, intake ? ? ?NEXT VISIT: Wednesday, March 15 during infusion ? ?[name] [name], RD, LDN ?Registered Dietitian ?[phone] (mobile) ? ? ?

## 2021-05-25 NOTE — Addendum Note (Signed)
Addended by: Vanice Sarah on: 05/25/2021 10:47 AM ? ? Modules accepted: Orders ? ?

## 2021-05-25 NOTE — Assessment & Plan Note (Addendum)
#   Rectal cancer-  locally advanced disease. MRI- OCT 2022- T4bN2; ? M [see below].  s/p  concurrent chemoradiation with Xeloda. Radiation [finished DEC 12th, 2022].  March 23, 2021-CT scan shows Continued circumferential wall thickening in the lower rectum suspicious for tumor. No pathologic adenopathy identified. See discussion below re: Lung nodule. ? ?# Continue with neo-adjuvant  FOLFOX chemotherapy every 2 weeks x 8 treatments [however if patient tolerating chemotherapy poorly/I think is reasonable to hold chemotherapy-and proceed with definitive therapy]. S/p  surgery evaluation at [facility] jan 2023.   ? ?# Continue with TNT- neoadjuvant  FOLFOX #4/8; Labs today reviewed;  acceptable for treatment today. Again discussed re: potential side effects including cold sensitivity. Labs today reviewed;platelets- 84  acceptable for treatment today. Will dose reduce Oxliplatin by 20%.  ? 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 ?# Diarrhea- G-2;from 5 FU-  Continue  Lomotil as needed- prn. STABLE.  ? ?# PET 2022-LEFT lower lobe nodule with increased metabolic activity suspicious for either metastatic colorectal neoplasm or second primary.  Mildly reduced in size, currently 0.8 by 0.6 cm [ formerly 1.1 by 0.9 cm]-monitor for now.  Will monitor with subsequent imaging.STABLE ? ?# Weight loss:? Diarrhea vs others-  refer to Joli re: weight loss. STABLE.  ? ?# Anxiety: poorly controlled; continue the Klonipin to 1 mg BID [[name]; buspar x 3 days discontinued nausea] Continue Klonipin for now.  Discussed with Josh-consider gabapentin/olanzapine.  Merrily Pew will reach out to the patient. Also informed Education officer, museum.  ? ?# Insmonia- add trazadone 50 mg qhs prn.  ? ?#Iron deficient anemia hemoglobin 12-secondary to chronic GI bleed iron deficiency;HOLD off venofer-as hemoglobin is-STABLE.  ? ?Lucianne Lei pt ?# DISPOSITION: ?# Proceed with FOLFOX chemo- ?# 1 week- labs- cbc/bmp- Possible 1 lit IVFs.  ?# Follow up 2 weeks- MD;  labs- cbc/cmp; FOLFOX; pump off d-2 days later---Dr.B ? ? ? ? ?

## 2021-05-25 NOTE — Progress Notes (Signed)
Platelets 88, Per Dr. Rogue Bussing okay to proceed with treatment, Oxaliplatin has been dose reduced per Dr. Rogue Bussing.  ? ? ? ?Pt states he does not want the Trazodone prescription due to "antidepressants make me suicidal and foam at the mouth" prescription given back to MD team and Dr. Rogue Bussing and Burnell Blanks NP made aware.  ?
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
## 2021-05-25 NOTE — Progress Notes (Signed)
Pt states after his last chemo treatment his "face locked up", was very painful, couldn't talk and hard to swallow. This has happened 3 times since that last treatment. ?

## 2021-05-27 ENCOUNTER — Other Ambulatory Visit: Payer: Self-pay

## 2021-05-27 ENCOUNTER — Inpatient Hospital Stay: Payer: Medicaid Other

## 2021-05-27 VITALS — BP 110/68 | HR 76 | Temp 97.0°F | Resp 18

## 2021-05-27 DIAGNOSIS — C20 Malignant neoplasm of rectum: Secondary | ICD-10-CM

## 2021-05-27 DIAGNOSIS — Z5111 Encounter for antineoplastic chemotherapy: Secondary | ICD-10-CM | POA: Diagnosis not present

## 2021-05-27 MED ORDER — HEPARIN SOD (PORK) LOCK FLUSH 100 UNIT/ML IV SOLN
500.0000 [IU] | Freq: Once | INTRAVENOUS | Status: AC | PRN
  Administered 2021-05-27: 500 [IU]
  Filled 2021-05-27: qty 5

## 2021-05-27 MED ORDER — SODIUM CHLORIDE 0.9% FLUSH
10.0000 mL | INTRAVENOUS | Status: DC | PRN
  Administered 2021-05-27: 10 mL
  Filled 2021-05-27: qty 10

## 2021-05-30 ENCOUNTER — Other Ambulatory Visit: Payer: Self-pay | Admitting: *Deleted

## 2021-05-30 ENCOUNTER — Encounter: Payer: Self-pay | Admitting: Licensed Clinical Social Worker

## 2021-05-30 DIAGNOSIS — C20 Malignant neoplasm of rectum: Secondary | ICD-10-CM

## 2021-05-30 NOTE — Progress Notes (Signed)
[place] ?Clinical Social Work ? ?Clinical Social Work was referred by  Palliative NP  for assessment of psychosocial needs.  Clinical Social Worker contacted patient by phone  to offer support and assess for needs.  CSW was unable to leave voicemail, voicemail not set up. ? ? ?Indiya Izquierdo, LCSW  ?Clinical Social Worker ?[place] ?      ?First Attempt ?

## 2021-06-01 ENCOUNTER — Inpatient Hospital Stay: Payer: Medicaid Other

## 2021-06-01 ENCOUNTER — Other Ambulatory Visit: Payer: Self-pay

## 2021-06-01 VITALS — BP 110/80 | HR 56 | Temp 95.0°F | Resp 18 | Wt 125.0 lb

## 2021-06-01 DIAGNOSIS — C20 Malignant neoplasm of rectum: Secondary | ICD-10-CM

## 2021-06-01 DIAGNOSIS — Z5111 Encounter for antineoplastic chemotherapy: Secondary | ICD-10-CM | POA: Diagnosis not present

## 2021-06-01 DIAGNOSIS — E611 Iron deficiency: Secondary | ICD-10-CM

## 2021-06-01 LAB — CBC WITH DIFFERENTIAL/PLATELET
Abs Immature Granulocytes: 0.02 10*3/uL (ref 0.00–0.07)
Basophils Absolute: 0 10*3/uL (ref 0.0–0.1)
Basophils Relative: 1 %
Eosinophils Absolute: 0.2 10*3/uL (ref 0.0–0.5)
Eosinophils Relative: 8 %
HCT: 30.2 % — ABNORMAL LOW (ref 39.0–52.0)
Hemoglobin: 10.7 g/dL — ABNORMAL LOW (ref 13.0–17.0)
Immature Granulocytes: 1 %
Lymphocytes Relative: 27 %
Lymphs Abs: 0.8 10*3/uL (ref 0.7–4.0)
MCH: 33.9 pg (ref 26.0–34.0)
MCHC: 35.4 g/dL (ref 30.0–36.0)
MCV: 95.6 fL (ref 80.0–100.0)
Monocytes Absolute: 0.4 10*3/uL (ref 0.1–1.0)
Monocytes Relative: 15 %
Neutro Abs: 1.4 10*3/uL — ABNORMAL LOW (ref 1.7–7.7)
Neutrophils Relative %: 48 %
Platelets: 132 10*3/uL — ABNORMAL LOW (ref 150–400)
RBC: 3.16 MIL/uL — ABNORMAL LOW (ref 4.22–5.81)
RDW: 14.3 % (ref 11.5–15.5)
WBC: 2.9 10*3/uL — ABNORMAL LOW (ref 4.0–10.5)
nRBC: 0 % (ref 0.0–0.2)

## 2021-06-01 LAB — COMPREHENSIVE METABOLIC PANEL
ALT: 27 U/L (ref 0–44)
AST: 28 U/L (ref 15–41)
Albumin: 4 g/dL (ref 3.5–5.0)
Alkaline Phosphatase: 62 U/L (ref 38–126)
Anion gap: 4 — ABNORMAL LOW (ref 5–15)
BUN: 11 mg/dL (ref 8–23)
CO2: 25 mmol/L (ref 22–32)
Calcium: 9 mg/dL (ref 8.9–10.3)
Chloride: 105 mmol/L (ref 98–111)
Creatinine, Ser: 0.71 mg/dL (ref 0.61–1.24)
GFR, Estimated: >60 mL/min (ref >60)
Glucose, Bld: 90 mg/dL (ref 70–99)
Potassium: 3.9 mmol/L (ref 3.5–5.1)
Sodium: 134 mmol/L — ABNORMAL LOW (ref 135–145)
Total Bilirubin: 0.6 mg/dL (ref 0.3–1.2)
Total Protein: 6.6 g/dL (ref 6.5–8.1)

## 2021-06-01 MED ORDER — DEXAMETHASONE SODIUM PHOSPHATE 10 MG/ML IJ SOLN
4.0000 mg | Freq: Once | INTRAMUSCULAR | Status: AC
  Administered 2021-06-01: 4 mg via INTRAVENOUS
  Filled 2021-06-01: qty 1

## 2021-06-01 MED ORDER — SODIUM CHLORIDE 0.9 % IV SOLN
Freq: Once | INTRAVENOUS | Status: AC
  Filled 2021-06-01: qty 250

## 2021-06-01 MED ORDER — HEPARIN SOD (PORK) LOCK FLUSH 100 UNIT/ML IV SOLN
500.0000 [IU] | Freq: Once | INTRAVENOUS | Status: AC | PRN
  Administered 2021-06-01: 500 [IU]
  Filled 2021-06-01: qty 5

## 2021-06-01 MED ORDER — SODIUM CHLORIDE 0.9% FLUSH
10.0000 mL | Freq: Once | INTRAVENOUS | Status: AC | PRN
  Administered 2021-06-01: 10 mL
  Filled 2021-06-01: qty 10

## 2021-06-01 NOTE — Patient Instructions (Signed)
Dexamethasone injection ?What is this medication? ?DEXAMETHASONE (dex a METH a sone) is a corticosteroid. It is used to treat inflammation of the skin, joints, lungs, and other organs. Common conditions treated include asthma, allergies, and arthritis. It is also used for other conditions, like blood disorders and diseases of the adrenal glands. ?This medicine may be used for other purposes; ask your health care provider or pharmacist if you have questions. ?COMMON BRAND NAME(S): Decadron, DoubleDex, ReadySharp Dexamethasone, Simplist Dexamethasone, Solurex ?What should I tell my care team before I take this medication? ?They need to know if you have any of these conditions: ?Cushing's syndrome ?diabetes ?glaucoma ?heart disease ?high blood pressure ?infection like herpes, measles, tuberculosis, or chickenpox ?kidney disease ?liver disease ?mental illness ?myasthenia gravis ?osteoporosis ?previous heart attack ?seizures ?stomach or intestine problems ?thyroid disease ?an unusual or allergic reaction to dexamethasone, corticosteroids, other medicines, lactose, foods, dyes, or preservatives ?pregnant or trying to get pregnant ?breast-feeding ?How should I use this medication? ?This medicine is for injection into a muscle, joint, lesion, soft tissue, or vein. It is given by a health care professional in a hospital or clinic setting. ?Talk to your pediatrician regarding the use of this medicine in children. Special care may be needed. ?Overdosage: If you think you have taken too much of this medicine contact a poison control center or emergency room at once. ?NOTE: This medicine is only for you. Do not share this medicine with others. ?What if I miss a dose? ?This may not apply. If you are having a series of injections over a prolonged period, try not to miss an appointment. Call your doctor or health care professional to reschedule if you are unable to keep an appointment. ?What may interact with this medication? ?Do  not take this medicine with any of the following medications: ?live virus vaccines ?This medicine may also interact with the following medications: ?aminoglutethimide ?amphotericin B ?aspirin and aspirin-like medicines ?certain antibiotics like erythromycin, clarithromycin, and troleandomycin ?certain antivirals for HIV or hepatitis ?certain medicines for seizures like carbamazepine, phenobarbital, phenytoin ?certain medicines to treat myasthenia gravis ?cholestyramine ?cyclosporine ?digoxin ?diuretics ?ephedrine ?male hormones, like estrogen or progestins and birth control pills ?insulin or other medicines for diabetes ?isoniazid ?ketoconazole ?medicines that relax muscles for surgery ?mifepristone ?NSAIDs, medicines for pain and inflammation, like ibuprofen or naproxen ?rifampin ?skin tests for allergies ?thalidomide ?vaccines ?warfarin ?This list may not describe all possible interactions. Give your health care provider a list of all the medicines, herbs, non-prescription drugs, or dietary supplements you use. Also tell them if you smoke, drink alcohol, or use illegal drugs. Some items may interact with your medicine. ?What should I watch for while using this medication? ?Visit your health care professional for regular checks on your progress. Tell your health care professional if your symptoms do not start to get better or if they get worse. Your condition will be monitored carefully while you are receiving this medicine. ?Wear a medical ID bracelet or chain. Carry a card that describes your disease and details of your medicine and dosage times. ?This medicine may increase your risk of getting an infection. Call your health care professional for advice if you get a fever, chills, or sore throat, or other symptoms of a cold or flu. Do not treat yourself. Try to avoid being around people who are sick. Call your health care professional if you are around anyone with measles, chickenpox, or if you develop sores or  blisters that do not heal properly. ?If you   are going to need surgery or other procedures, tell your doctor or health care professional that you have taken this medicine within the last 12 months. ?Ask your doctor or health care professional about your diet. You may need to lower the amount of salt you eat. ?This medicine may increase blood sugar. Ask your healthcare provider if changes in diet or medicines are needed if you have diabetes. ?What side effects may I notice from receiving this medication? ?Side effects that you should report to your doctor or health care professional as soon as possible: ?allergic reactions like skin rash, itching or hives, swelling of the face, lips, or tongue ?bloody or black, tarry stools ?changes in emotions or moods ?changes in vision ?confusion, excitement, restlessness ?depressed mood ?eye pain ?hallucinations ?muscle weakness ?severe or sudden stomach or belly pain ?signs and symptoms of high blood sugar such as being more thirsty or hungry or having to urinate more than normal. You may also feel very tired or have blurry vision. ?signs and symptoms of infection like fever; chills; cough; sore throat; pain or trouble passing urine ?swelling of ankles, feet ?unusual bruising or bleeding ?wounds that do not heal ?Side effects that usually do not require medical attention (report to your doctor or health care professional if they continue or are bothersome): ?increased appetite ?increased growth of face or body hair ?headache ?nausea, vomiting ?pain, redness, or irritation at site where injected ?skin problems, acne, thin and shiny skin ?trouble sleeping ?weight gain ?This list may not describe all possible side effects. Call your doctor for medical advice about side effects. You may report side effects to FDA at 1-800-FDA-1088. ?Where should I keep my medication? ?This medicine is given in a hospital or clinic and will not be stored at home. ?NOTE: This sheet is a summary. It may  not cover all possible information. If you have questions about this medicine, talk to your doctor, pharmacist, or health care provider. ?? 2022 Elsevier/Gold Standard (2018-09-26 00:00:00) ? ?

## 2021-06-01 NOTE — Progress Notes (Signed)
Patient tolerated 1L IVF infusion well today along with '4mg'$  IV Dex ok'd by Dr. Jacinto Reap. No concerns voiced. Patient discharged and aware of future appointments. Stable.  ?

## 2021-06-08 ENCOUNTER — Encounter: Payer: Self-pay | Admitting: Internal Medicine

## 2021-06-08 ENCOUNTER — Other Ambulatory Visit: Payer: Self-pay

## 2021-06-08 ENCOUNTER — Inpatient Hospital Stay: Payer: Medicaid Other

## 2021-06-08 ENCOUNTER — Telehealth: Payer: Self-pay

## 2021-06-08 ENCOUNTER — Encounter: Payer: Self-pay | Admitting: Licensed Clinical Social Worker

## 2021-06-08 ENCOUNTER — Inpatient Hospital Stay (HOSPITAL_BASED_OUTPATIENT_CLINIC_OR_DEPARTMENT_OTHER): Payer: Medicaid Other | Admitting: Internal Medicine

## 2021-06-08 DIAGNOSIS — F419 Anxiety disorder, unspecified: Secondary | ICD-10-CM

## 2021-06-08 DIAGNOSIS — Z5111 Encounter for antineoplastic chemotherapy: Secondary | ICD-10-CM | POA: Diagnosis not present

## 2021-06-08 DIAGNOSIS — C20 Malignant neoplasm of rectum: Secondary | ICD-10-CM | POA: Diagnosis not present

## 2021-06-08 LAB — CBC WITH DIFFERENTIAL/PLATELET
Abs Immature Granulocytes: 0.02 10*3/uL (ref 0.00–0.07)
Basophils Absolute: 0 10*3/uL (ref 0.0–0.1)
Basophils Relative: 1 %
Eosinophils Absolute: 0.3 10*3/uL (ref 0.0–0.5)
Eosinophils Relative: 7 %
HCT: 31.7 % — ABNORMAL LOW (ref 39.0–52.0)
Hemoglobin: 10.9 g/dL — ABNORMAL LOW (ref 13.0–17.0)
Immature Granulocytes: 0 %
Lymphocytes Relative: 16 %
Lymphs Abs: 0.8 10*3/uL (ref 0.7–4.0)
MCH: 34 pg (ref 26.0–34.0)
MCHC: 34.4 g/dL (ref 30.0–36.0)
MCV: 98.8 fL (ref 80.0–100.0)
Monocytes Absolute: 0.7 10*3/uL (ref 0.1–1.0)
Monocytes Relative: 15 %
Neutro Abs: 2.8 10*3/uL (ref 1.7–7.7)
Neutrophils Relative %: 61 %
Platelets: 101 10*3/uL — ABNORMAL LOW (ref 150–400)
RBC: 3.21 MIL/uL — ABNORMAL LOW (ref 4.22–5.81)
RDW: 16.1 % — ABNORMAL HIGH (ref 11.5–15.5)
WBC: 4.7 10*3/uL (ref 4.0–10.5)
nRBC: 0 % (ref 0.0–0.2)

## 2021-06-08 LAB — COMPREHENSIVE METABOLIC PANEL WITH GFR
ALT: 23 U/L (ref 0–44)
AST: 27 U/L (ref 15–41)
Albumin: 3.8 g/dL (ref 3.5–5.0)
Alkaline Phosphatase: 70 U/L (ref 38–126)
Anion gap: 4 — ABNORMAL LOW (ref 5–15)
BUN: 7 mg/dL — ABNORMAL LOW (ref 8–23)
CO2: 25 mmol/L (ref 22–32)
Calcium: 8.8 mg/dL — ABNORMAL LOW (ref 8.9–10.3)
Chloride: 106 mmol/L (ref 98–111)
Creatinine, Ser: 0.72 mg/dL (ref 0.61–1.24)
GFR, Estimated: >60 mL/min
Glucose, Bld: 102 mg/dL — ABNORMAL HIGH (ref 70–99)
Potassium: 4.1 mmol/L (ref 3.5–5.1)
Sodium: 135 mmol/L (ref 135–145)
Total Bilirubin: 0.2 mg/dL — ABNORMAL LOW (ref 0.3–1.2)
Total Protein: 6.3 g/dL — ABNORMAL LOW (ref 6.5–8.1)

## 2021-06-08 MED ORDER — OXALIPLATIN CHEMO INJECTION 100 MG/20ML
60.0000 mg/m2 | Freq: Once | INTRAVENOUS | Status: AC
  Administered 2021-06-08: 100 mg via INTRAVENOUS
  Filled 2021-06-08: qty 20

## 2021-06-08 MED ORDER — SODIUM CHLORIDE 0.9 % IV SOLN
10.0000 mg | Freq: Once | INTRAVENOUS | Status: AC
  Administered 2021-06-08: 10 mg via INTRAVENOUS
  Filled 2021-06-08: qty 10

## 2021-06-08 MED ORDER — PALONOSETRON HCL INJECTION 0.25 MG/5ML
0.2500 mg | Freq: Once | INTRAVENOUS | Status: AC
  Administered 2021-06-08: 0.25 mg via INTRAVENOUS
  Filled 2021-06-08: qty 5

## 2021-06-08 MED ORDER — LEUCOVORIN CALCIUM INJECTION 350 MG
392.0000 mg/m2 | Freq: Once | INTRAVENOUS | Status: AC
  Administered 2021-06-08: 650 mg via INTRAVENOUS
  Filled 2021-06-08: qty 32.5

## 2021-06-08 MED ORDER — OXALIPLATIN CHEMO INJECTION 100 MG/20ML: 85.0000 mg/m2 | Freq: Once | INTRAVENOUS | Status: DC

## 2021-06-08 MED ORDER — DEXTROSE 5 % IV SOLN
Freq: Once | INTRAVENOUS | Status: AC
  Filled 2021-06-08: qty 250

## 2021-06-08 MED ORDER — SODIUM CHLORIDE 0.9 % IV SOLN
2400.0000 mg/m2 | INTRAVENOUS | Status: DC
  Administered 2021-06-08: 4000 mg via INTRAVENOUS
  Filled 2021-06-08: qty 80

## 2021-06-08 NOTE — Progress Notes (Signed)
Nutrition Follow-up: ? ?Patient with locally advanced rectal cancer currently receiving neoadjuvant chemotherapy.   ? ?Met with patient during infusion.  Patient reports that he is drinking about 2 vanilla boost plus shakes a day.  Does not like the vanilla as well as chocolate.  Has been mixing chocolate syrup in them.  Usually always eats breakfast (egg sandwich, grilled cheese sandwich, cereal, oatmeal).  Goes out to eat (vegetable plate from [employer] or [place]).  Patient and son do not cook.  Ate a fish sandwich from [employer] yesterday.   ? ? ? ?Medications: reviewed ? ?Labs: reviewed ? ?Anthropometrics:  ? ?Weight 126 lb 9.6 oz today ? ?125 lb on 3/1 ? ? ?NUTRITION DIAGNOSIS: Inadequate oral intake stable ? ? ?INTERVENTION:  ?RD spoke with [employer] and it is noted on patient's record to send chocolate shakes.  Encouraged patient to ask for chocolate on re-order.   ?Patient to continue eating high calorie, high protein foods to maintain weight ?  ? ?MONITORING, EVALUATION, GOAL: weight trends, intake ? ? ?NEXT VISIT: Wednesday, March 29 during infusion ? ?Anmol Paschen B. Zenia Resides, RD, LDN ?Registered Dietitian ?[phone] (mobile) ? ? ?

## 2021-06-08 NOTE — Progress Notes (Signed)
Patient reports he does take Clonazepam and it doesn't seem to be helping with depression but it is better than nothing.  He is not taking the Trazodone because it does not help.  Does take 1-2 Lomotil on a routine basis because he will have diarrhea if he doesn't take daily. ?

## 2021-06-08 NOTE — Patient Instructions (Signed)
[facility] CANCER CTR AT [place]  Discharge Instructions: ?Thank you for choosing [facility] to provide your oncology and hematology care.  ?If you have a lab appointment with the [facility], please go directly to the [facility] and check in at the registration area. ? ?Wear comfortable clothing and clothing appropriate for easy access to any Portacath or PICC line.  ? ?We strive to give you quality time with your provider. You may need to reschedule your appointment if you arrive late (15 or more minutes).  Arriving late affects you and other patients whose appointments are after yours.  Also, if you miss three or more appointments without notifying the office, you may be dismissed from the clinic at the provider?s discretion.    ?  ?For prescription refill requests, have your pharmacy contact our office and allow 72 hours for refills to be completed.   ? ?Today you received the following chemotherapy and/or immunotherapy agents OXALIPLATIN, LEUCOVORIN, 5FU    ?  ?To help prevent nausea and vomiting after your treatment, we encourage you to take your nausea medication as directed. ? ?BELOW ARE SYMPTOMS THAT SHOULD BE REPORTED IMMEDIATELY: ?*FEVER GREATER THAN 100.4 F (38 ?C) OR HIGHER ?*CHILLS OR SWEATING ?*NAUSEA AND VOMITING THAT IS NOT CONTROLLED WITH YOUR NAUSEA MEDICATION ?*UNUSUAL SHORTNESS OF BREATH ?*UNUSUAL BRUISING OR BLEEDING ?*URINARY PROBLEMS (pain or burning when urinating, or frequent urination) ?*BOWEL PROBLEMS (unusual diarrhea, constipation, pain near the anus) ?TENDERNESS IN MOUTH AND THROAT WITH OR WITHOUT PRESENCE OF ULCERS (sore throat, sores in mouth, or a toothache) ?UNUSUAL RASH, SWELLING OR PAIN  ?UNUSUAL VAGINAL DISCHARGE OR ITCHING  ? ?Items with * indicate a potential emergency and should be followed up as soon as possible or go to the Emergency Department if any problems should occur. ? ?Please show the CHEMOTHERAPY ALERT CARD or IMMUNOTHERAPY ALERT  CARD at check-in to the Emergency Department and triage nurse. ? ?Should you have questions after your visit or need to cancel or reschedule your appointment, please contact [facility] CANCER [address] AT [place]  [phone] and follow the prompts.  Office hours are 8:00 a.m. to 4:30 p.m. Monday - Friday. Please note that voicemails left after 4:00 p.m. may not be returned until the following business day.  We are closed weekends and major holidays. You have access to a nurse at all times for urgent questions. Please call the main number to the clinic [phone] and follow the prompts. ? ?For any non-urgent questions, you may also contact your provider using MyChart. We now offer e-Visits for anyone 39 and older to request care online for non-urgent symptoms. For details visit [email or web address]. ?  ?Also download the MyChart app! Go to the app store, search "MyChart", open the app, select [facility], and log in with your MyChart username and password. ? ?Due to Covid, a mask is required upon entering the hospital/clinic. If you do not have a mask, one will be given to you upon arrival. For doctor visits, patients may have 1 support person aged 62 or older with them. For treatment visits, patients cannot have anyone with them due to current Covid guidelines and our immunocompromised population.  ? ?Oxaliplatin Injection ?What is this medication? ?OXALIPLATIN (ox AL i PLA tin) is a chemotherapy drug. It targets fast dividing cells, like cancer cells, and causes these cells to die. This medicine is used to treat cancers of the colon and rectum, and many other cancers. ?This medicine may be used for other  purposes; ask your health care provider or pharmacist if you have questions. ?COMMON BRAND NAME(S): Eloxatin ?What should I tell my care team before I take this medication? ?They need to know if you have any of these conditions: ?heart disease ?history of irregular heartbeat ?liver disease ?low blood  counts, like white cells, platelets, or red blood cells ?lung or breathing disease, like asthma ?take medicines that treat or prevent blood clots ?tingling of the fingers or toes, or other nerve disorder ?an unusual or allergic reaction to oxaliplatin, other chemotherapy, other medicines, foods, dyes, or preservatives ?pregnant or trying to get pregnant ?breast-feeding ?How should I use this medication? ?This drug is given as an infusion into a vein. It is administered in a hospital or clinic by a specially trained health care professional. ?Talk to your pediatrician regarding the use of this medicine in children. Special care may be needed. ?Overdosage: If you think you have taken too much of this medicine contact a poison control center or emergency room at once. ?NOTE: This medicine is only for you. Do not share this medicine with others. ?What if I miss a dose? ?It is important not to miss a dose. Call your doctor or health care professional if you are unable to keep an appointment. ?What may interact with this medication? ?Do not take this medicine with any of the following medications: ?cisapride ?dronedarone ?pimozide ?thioridazine ?This medicine may also interact with the following medications: ?aspirin and aspirin-like medicines ?certain medicines that treat or prevent blood clots like warfarin, apixaban, dabigatran, and rivaroxaban ?cisplatin ?cyclosporine ?diuretics ?medicines for infection like acyclovir, adefovir, amphotericin B, bacitracin, cidofovir, foscarnet, ganciclovir, gentamicin, pentamidine, vancomycin ?NSAIDs, medicines for pain and inflammation, like ibuprofen or naproxen ?other medicines that prolong the QT interval (an abnormal heart rhythm) ?pamidronate ?zoledronic acid ?This list may not describe all possible interactions. Give your health care provider a list of all the medicines, herbs, non-prescription drugs, or dietary supplements you use. Also tell them if you smoke, drink alcohol,  or use illegal drugs. Some items may interact with your medicine. ?What should I watch for while using this medication? ?Your condition will be monitored carefully while you are receiving this medicine. ?You may need blood work done while you are taking this medicine. ?This medicine may make you feel generally unwell. This is not uncommon as chemotherapy can affect healthy cells as well as cancer cells. Report any side effects. Continue your course of treatment even though you feel ill unless your healthcare professional tells you to stop. ?This medicine can make you more sensitive to cold. Do not drink cold drinks or use ice. Cover exposed skin before coming in contact with cold temperatures or cold objects. When out in cold weather wear warm clothing and cover your mouth and nose to warm the air that goes into your lungs. Tell your doctor if you get sensitive to the cold. ?Do not become pregnant while taking this medicine or for 9 months after stopping it. Women should inform their health care professional if they wish to become pregnant or think they might be pregnant. Men should not father a child while taking this medicine and for 6 months after stopping it. There is potential for serious side effects to an unborn child. Talk to your health care professional for more information. ?Do not breast-feed a child while taking this medicine or for 3 months after stopping it. ?This medicine has caused ovarian failure in some women. This medicine may make it more difficult to  get pregnant. Talk to your health care professional if you are concerned about your fertility. ?This medicine has caused decreased sperm counts in some men. This may make it more difficult to father a child. Talk to your health care professional if you are concerned about your fertility. ?This medicine may increase your risk of getting an infection. Call your health care professional for advice if you get a fever, chills, or sore throat, or other  symptoms of a cold or flu. Do not treat yourself. Try to avoid being around people who are sick. ?Avoid taking medicines that contain aspirin, acetaminophen, ibuprofen, naproxen, or ketoprofen unless instr
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
## 2021-06-08 NOTE — Assessment & Plan Note (Addendum)
#   Rectal cancer-  locally advanced disease. MRI- OCT 2022- T4bN2; ? M [see below].  s/p  concurrent chemoradiation with Xeloda. Radiation [finished DEC 12th, 2022].  March 23, 2021-CT scan shows Continued circumferential wall thickening in the lower rectum suspicious for tumor. No pathologic adenopathy identified. See discussion below re: Lung nodule. ? ?# Continue with neo-adjuvant  FOLFOX chemotherapy every 2 weeks x 8 treatments [however if patient tolerating chemotherapy poorly/I think is reasonable to hold chemotherapy-and proceed with definitive therapy]. S/p  surgery evaluation at [facility] jan 2023.   ? ?# Continue with TNT- neoadjuvant  FOLFOX #5/8; Labs today reviewed;  acceptable for treatment today. Again discussed re: potential side effects including cold sensitivity. Labs today reviewed;platelets- >100  acceptable for treatment today. continue Oxliplatin with 20% reduction.   ? 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  ? ?# Diarrhea- G-2;from 5 FU-  Continue  Lomotil as needed- prn. STABLE.  ? ?# PET 2022-LEFT lower lobe nodule with increased metabolic activity suspicious for either metastatic colorectal neoplasm or second primary.  Mildly reduced in size, currently 0.8 by 0.6 cm [ formerly 1.1 by 0.9 cm]-monitor for now.  Will monitor with subsequent imaging.STABLE.  ? ?# Weight loss:? Diarrhea vs others-  refer to [facility] re: weight loss. STABLE.  ? 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  Discussed with Josh-consider gabapentin/olanzapine-declines.. s/p Josh evaluation- refer to psychiatry.  Also informed Social worker re: transportation.  ? ?# [place]mon[place]- add trazadone 50 mg qhs prn. Pt non-compliant.  ? ?#Iron deficient anemia hemoglobin 12-secondary to chronic GI bleed iron deficiency;HOLD off venofer-as hemoglobin is-STABLE.  ? ?Lucianne Lei pt ?# DISPOSITION: ?#  Proceed with FOLFOX chemo- ?# 1 week- labs- cbc/bmp- Possible 1 lit IVFs.  ?# Follow up 2 weeks- MD; labs- cbc/cmp; FOLFOX; pump off d-2 days later---Dr.B ? ? ? ? ?

## 2021-06-08 NOTE — Telephone Encounter (Signed)
[name] Glatter, NP would like patient to be referred to [facility].  Referral entered specifying office we are referring. ? ?Referral entered in Epic to [facility] can't be viewed in the chart but I have recently confirmed with that office that they do receive referrals entered in [facility].   ?

## 2021-06-08 NOTE — Addendum Note (Signed)
Addended by: Altha Harm R on: 06/08/2021 10:15 AM ? ? Modules accepted: Orders ? ?

## 2021-06-09 ENCOUNTER — Encounter: Payer: Self-pay | Admitting: Internal Medicine

## 2021-06-09 NOTE — Progress Notes (Signed)
[facility] Clinical Social Work  ?Initial Assessment ? ? ?Maahir L Schaum is a 61 y.o. year old male seen in infusion . Clinical Social Work was referred by medical provider for assessment of psychosocial needs.  ? ?SDOH (Social Determinants of Health) assessments performed: Yes ?SDOH Interventions   ? ?Flowsheet Row Most Recent Value  ?SDOH Interventions   ?Food Insecurity Interventions Intervention Not Indicated  ?Housing Interventions Intervention Not Indicated  ?Intimate Partner Violence Interventions Intervention Not Indicated  ?Physical Activity Interventions Intervention Not Indicated  ?Stress Interventions Intervention Not Indicated  ?Social Connections Interventions Intervention Not Indicated  ?Transportation Interventions [employer], Other (Comment)  [CSW informed patient on Medicaid transportation availability]  ? ?  ?  ?Distress Screen completed: No ?No flowsheet data found. ? ? ? ?Family/Social Information:  ?Housing Arrangement: patient lives with mother Aldine Contes [phone] ?Family members/support persons in your life? Family, Medical Staff, and Friends/Colleagues ?Transportation concerns: patient no longer has transportation concerns, however palliative care NP has referred patient to a psychiatrist and CSW spoke with him about Medicare options of transportation. ?Employment: Retired Income source: [place] ?Financial concerns: No ?Type of concern: None ?Food access concerns: no ?Religious or spiritual practice: no ?Services Currently in place:  N/A ? ?Coping/ Adjustment to diagnosis: ?Patient understands treatment plan and what happens next? yes ?Concerns about diagnosis and/or treatment: I'm not especially worried about anything ?Patient reported stressors:  N/A ?Hopes and priorities: Patient stated he is feeling well and resolved his earlier transportation issues. ?Patient enjoys being outside and time with family/ friends ?Current coping skills/ strengths:  Ability for insight , Active sense of humor , Average or above average intelligence , Capable of independent living , Communication skills , Motivation for treatment/growth , and Supportive family/friends  ? ? ? SUMMARY: ?Current SDOH Barriers:  ?Financial constraints related to fixed income ? ?Clinical Social Work Clinical Goal(s):  ?N/A ? ?Interventions: ?Discussed common feeling and emotions when being diagnosed with cancer, and the importance of support during treatment ?Informed patient of the support team roles and support services at [facility] ?Provided CSW contact information and encouraged patient to call with any questions or concerns ?Provided patient with information about CSW role in patient care and available resources and Provided education regarding Medicaid transportation. ? ? ?Follow Up Plan: Patient will contact CSW with any support or resource needs ?Patient verbalizes understanding of plan: Yes ? ?Patient stated he would contact his Medicaid case worker, once he received a call from the psychiatrist to schedule and appointment, to discuss Medicaid transportation services. ? ?Rubi Tooley, LCSW ?

## 2021-06-10 ENCOUNTER — Inpatient Hospital Stay: Payer: Medicaid Other

## 2021-06-10 ENCOUNTER — Other Ambulatory Visit: Payer: Self-pay

## 2021-06-10 VITALS — BP 108/69 | HR 60 | Resp 18

## 2021-06-10 DIAGNOSIS — C20 Malignant neoplasm of rectum: Secondary | ICD-10-CM

## 2021-06-10 DIAGNOSIS — Z5111 Encounter for antineoplastic chemotherapy: Secondary | ICD-10-CM | POA: Diagnosis not present

## 2021-06-10 MED ORDER — SODIUM CHLORIDE 0.9% FLUSH
10.0000 mL | INTRAVENOUS | Status: DC | PRN
  Administered 2021-06-10: 10 mL
  Filled 2021-06-10: qty 10

## 2021-06-10 MED ORDER — HEPARIN SOD (PORK) LOCK FLUSH 100 UNIT/ML IV SOLN
500.0000 [IU] | Freq: Once | INTRAVENOUS | Status: AC | PRN
  Administered 2021-06-10: 500 [IU]
  Filled 2021-06-10: qty 5

## 2021-06-12 ENCOUNTER — Other Ambulatory Visit: Payer: Self-pay | Admitting: Internal Medicine

## 2021-06-15 ENCOUNTER — Other Ambulatory Visit: Payer: Self-pay | Admitting: *Deleted

## 2021-06-15 ENCOUNTER — Other Ambulatory Visit (HOSPITAL_COMMUNITY): Payer: Self-pay

## 2021-06-15 ENCOUNTER — Inpatient Hospital Stay: Payer: Medicaid Other

## 2021-06-15 ENCOUNTER — Telehealth: Payer: Self-pay | Admitting: *Deleted

## 2021-06-15 ENCOUNTER — Other Ambulatory Visit: Payer: Self-pay

## 2021-06-15 VITALS — BP 106/65 | HR 56 | Temp 97.0°F | Resp 18

## 2021-06-15 DIAGNOSIS — Z5111 Encounter for antineoplastic chemotherapy: Secondary | ICD-10-CM | POA: Diagnosis not present

## 2021-06-15 DIAGNOSIS — C20 Malignant neoplasm of rectum: Secondary | ICD-10-CM

## 2021-06-15 DIAGNOSIS — R531 Weakness: Secondary | ICD-10-CM

## 2021-06-15 DIAGNOSIS — E611 Iron deficiency: Secondary | ICD-10-CM

## 2021-06-15 LAB — CBC WITH DIFFERENTIAL/PLATELET
Abs Immature Granulocytes: 0.01 10*3/uL (ref 0.00–0.07)
Basophils Absolute: 0 10*3/uL (ref 0.0–0.1)
Basophils Relative: 1 %
Eosinophils Absolute: 0.3 10*3/uL (ref 0.0–0.5)
Eosinophils Relative: 4 %
HCT: 32.9 % — ABNORMAL LOW (ref 39.0–52.0)
Hemoglobin: 11.2 g/dL — ABNORMAL LOW (ref 13.0–17.0)
Immature Granulocytes: 0 %
Lymphocytes Relative: 14 %
Lymphs Abs: 0.8 10*3/uL (ref 0.7–4.0)
MCH: 33.2 pg (ref 26.0–34.0)
MCHC: 34 g/dL (ref 30.0–36.0)
MCV: 97.6 fL (ref 80.0–100.0)
Monocytes Absolute: 0.6 10*3/uL (ref 0.1–1.0)
Monocytes Relative: 10 %
Neutro Abs: 4.3 10*3/uL (ref 1.7–7.7)
Neutrophils Relative %: 71 %
Platelets: 102 10*3/uL — ABNORMAL LOW (ref 150–400)
RBC: 3.37 MIL/uL — ABNORMAL LOW (ref 4.22–5.81)
RDW: 15 % (ref 11.5–15.5)
WBC: 6 10*3/uL (ref 4.0–10.5)
nRBC: 0 % (ref 0.0–0.2)

## 2021-06-15 LAB — BASIC METABOLIC PANEL
Anion gap: 5 (ref 5–15)
BUN: 10 mg/dL (ref 8–23)
CO2: 25 mmol/L (ref 22–32)
Calcium: 9 mg/dL (ref 8.9–10.3)
Chloride: 106 mmol/L (ref 98–111)
Creatinine, Ser: 0.74 mg/dL (ref 0.61–1.24)
GFR, Estimated: >60 mL/min (ref >60)
Glucose, Bld: 99 mg/dL (ref 70–99)
Potassium: 4.3 mmol/L (ref 3.5–5.1)
Sodium: 136 mmol/L (ref 135–145)

## 2021-06-15 MED ORDER — HEPARIN SOD (PORK) LOCK FLUSH 100 UNIT/ML IV SOLN
500.0000 [IU] | Freq: Once | INTRAVENOUS | Status: AC | PRN
  Administered 2021-06-15: 500 [IU]
  Filled 2021-06-15: qty 5

## 2021-06-15 MED ORDER — SODIUM CHLORIDE 0.9% FLUSH
10.0000 mL | Freq: Once | INTRAVENOUS | Status: AC | PRN
  Administered 2021-06-15: 10 mL
  Filled 2021-06-15: qty 10

## 2021-06-15 MED ORDER — PANTOPRAZOLE SODIUM 40 MG PO TBEC
40.0000 mg | DELAYED_RELEASE_TABLET | Freq: Every day | ORAL | 1 refills | Status: DC | PRN
Start: 2021-06-15 — End: 2021-06-15
  Filled 2021-06-15: qty 30, 30d supply, fill #0

## 2021-06-15 MED ORDER — ONDANSETRON HCL 4 MG/2ML IJ SOLN
8.0000 mg | Freq: Once | INTRAMUSCULAR | Status: AC
  Administered 2021-06-15: 8 mg via INTRAVENOUS
  Filled 2021-06-15: qty 4

## 2021-06-15 MED ORDER — PANTOPRAZOLE SODIUM 40 MG PO TBEC
40.0000 mg | DELAYED_RELEASE_TABLET | Freq: Every day | ORAL | 1 refills | Status: DC | PRN
Start: 2021-06-15 — End: 2022-03-10

## 2021-06-15 MED ORDER — SODIUM CHLORIDE 0.9 % IV SOLN
Freq: Once | INTRAVENOUS | Status: AC
  Filled 2021-06-15: qty 250

## 2021-06-15 MED ORDER — SODIUM CHLORIDE 0.9 % IV SOLN: Freq: Once | INTRAVENOUS | Status: DC

## 2021-06-15 MED ORDER — DEXAMETHASONE SODIUM PHOSPHATE 10 MG/ML IJ SOLN
10.0000 mg | Freq: Once | INTRAMUSCULAR | Status: AC
  Administered 2021-06-15: 10 mg via INTRAVENOUS
  Filled 2021-06-15: qty 1

## 2021-06-15 NOTE — Progress Notes (Signed)
Pt receiving IVF's with supportive IV medications. No diarrhea at this time. Diminished appetite. ?

## 2021-06-15 NOTE — Telephone Encounter (Signed)
Protonix Rx sent to [facility] instead of [facility]. Called [facility] to cancel that prescription. We reorder to [facility]. ?

## 2021-06-22 ENCOUNTER — Inpatient Hospital Stay (HOSPITAL_BASED_OUTPATIENT_CLINIC_OR_DEPARTMENT_OTHER): Payer: Medicaid Other | Admitting: Oncology

## 2021-06-22 ENCOUNTER — Inpatient Hospital Stay: Payer: Medicaid Other

## 2021-06-22 ENCOUNTER — Encounter: Payer: Self-pay | Admitting: Oncology

## 2021-06-22 ENCOUNTER — Other Ambulatory Visit: Payer: Self-pay

## 2021-06-22 VITALS — BP 111/75 | HR 55 | Temp 97.9°F | Resp 18 | Wt 127.4 lb

## 2021-06-22 DIAGNOSIS — T451X5A Adverse effect of antineoplastic and immunosuppressive drugs, initial encounter: Secondary | ICD-10-CM | POA: Diagnosis not present

## 2021-06-22 DIAGNOSIS — C20 Malignant neoplasm of rectum: Secondary | ICD-10-CM

## 2021-06-22 DIAGNOSIS — D6959 Other secondary thrombocytopenia: Secondary | ICD-10-CM | POA: Diagnosis not present

## 2021-06-22 DIAGNOSIS — Z5111 Encounter for antineoplastic chemotherapy: Secondary | ICD-10-CM

## 2021-06-22 LAB — CBC WITH DIFFERENTIAL/PLATELET
Abs Immature Granulocytes: 0.02 10*3/uL (ref 0.00–0.07)
Basophils Absolute: 0 10*3/uL (ref 0.0–0.1)
Basophils Relative: 0 %
Eosinophils Absolute: 0.3 10*3/uL (ref 0.0–0.5)
Eosinophils Relative: 6 %
HCT: 34 % — ABNORMAL LOW (ref 39.0–52.0)
Hemoglobin: 11.6 g/dL — ABNORMAL LOW (ref 13.0–17.0)
Immature Granulocytes: 0 %
Lymphocytes Relative: 15 %
Lymphs Abs: 0.7 10*3/uL (ref 0.7–4.0)
MCH: 33.8 pg (ref 26.0–34.0)
MCHC: 34.1 g/dL (ref 30.0–36.0)
MCV: 99.1 fL (ref 80.0–100.0)
Monocytes Absolute: 0.6 10*3/uL (ref 0.1–1.0)
Monocytes Relative: 13 %
Neutro Abs: 2.9 10*3/uL (ref 1.7–7.7)
Neutrophils Relative %: 66 %
Platelets: 66 10*3/uL — ABNORMAL LOW (ref 150–400)
RBC: 3.43 MIL/uL — ABNORMAL LOW (ref 4.22–5.81)
RDW: 16.5 % — ABNORMAL HIGH (ref 11.5–15.5)
WBC: 4.5 10*3/uL (ref 4.0–10.5)
nRBC: 0 % (ref 0.0–0.2)

## 2021-06-22 LAB — COMPREHENSIVE METABOLIC PANEL
ALT: 26 U/L (ref 0–44)
AST: 35 U/L (ref 15–41)
Albumin: 3.9 g/dL (ref 3.5–5.0)
Alkaline Phosphatase: 66 U/L (ref 38–126)
Anion gap: 4 — ABNORMAL LOW (ref 5–15)
BUN: 9 mg/dL (ref 8–23)
CO2: 24 mmol/L (ref 22–32)
Calcium: 8.6 mg/dL — ABNORMAL LOW (ref 8.9–10.3)
Chloride: 108 mmol/L (ref 98–111)
Creatinine, Ser: 0.77 mg/dL (ref 0.61–1.24)
GFR, Estimated: >60 mL/min (ref >60)
Glucose, Bld: 124 mg/dL — ABNORMAL HIGH (ref 70–99)
Potassium: 4.1 mmol/L (ref 3.5–5.1)
Sodium: 136 mmol/L (ref 135–145)
Total Bilirubin: 0.5 mg/dL (ref 0.3–1.2)
Total Protein: 6.7 g/dL (ref 6.5–8.1)

## 2021-06-22 MED ORDER — HEPARIN SOD (PORK) LOCK FLUSH 100 UNIT/ML IV SOLN
500.0000 [IU] | Freq: Once | INTRAVENOUS | Status: AC
  Administered 2021-06-22: 500 [IU] via INTRAVENOUS
  Filled 2021-06-22: qty 5

## 2021-06-22 MED ORDER — SODIUM CHLORIDE 0.9% FLUSH
10.0000 mL | Freq: Once | INTRAVENOUS | Status: AC
  Administered 2021-06-22: 10 mL via INTRAVENOUS
  Filled 2021-06-22: qty 10
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
## 2021-06-22 NOTE — Progress Notes (Signed)
Pt states that he has not had any appetite at all, his antidepressants have serious side effects so he rather not take them at all. No energy hardly.  ?

## 2021-06-22 NOTE — Progress Notes (Signed)
Nutrition ? ?RD planning to see patient in infusion today but infusion cancelled.   ? ?RD to follow-up on 4/19 ? ?Libbie Bartley B. Zenia Resides, RD, LDN ?Registered Dietitian ?[phone] ? ?

## 2021-06-25 ENCOUNTER — Other Ambulatory Visit: Payer: Self-pay | Admitting: Internal Medicine

## 2021-06-25 ENCOUNTER — Other Ambulatory Visit: Payer: Self-pay | Admitting: Oncology

## 2021-06-25 MED ORDER — CLONAZEPAM 1 MG PO TABS: 1.0000 mg | ORAL_TABLET | Freq: Two times a day (BID) | ORAL | 0 refills | Status: DC | PRN

## 2021-06-26 ENCOUNTER — Other Ambulatory Visit: Payer: Self-pay | Admitting: Oncology

## 2021-06-26 MED ORDER — CLONAZEPAM 1 MG PO TABS: 1.0000 mg | ORAL_TABLET | Freq: Two times a day (BID) | ORAL | 0 refills | Status: DC | PRN

## 2021-06-27 ENCOUNTER — Encounter: Payer: Self-pay | Admitting: Licensed Clinical Social Worker

## 2021-06-27 ENCOUNTER — Encounter: Payer: Self-pay | Admitting: Internal Medicine

## 2021-06-27 ENCOUNTER — Other Ambulatory Visit: Payer: Self-pay | Admitting: Hospice and Palliative Medicine

## 2021-06-27 DIAGNOSIS — F419 Anxiety disorder, unspecified: Secondary | ICD-10-CM

## 2021-06-27 MED ORDER — CLONAZEPAM 1 MG PO TABS: 1.0000 mg | ORAL_TABLET | Freq: Two times a day (BID) | ORAL | 0 refills | Status: DC | PRN

## 2021-06-27 NOTE — Progress Notes (Signed)
Patient ran out of clonazepam and requested refill from on-call over the weekend.  It appears that it was sent as a printed prescription by Dr. Grayland Ormond yesterday.  I spoke with patient today who is quite tearful and anxious regarding access to his medications.  I sent an electronic refill to pharmacy.  I sent another referral request for psychiatry.  Patient remains adamant that he will not take antidepressants despite endorsing significant depressive symptoms.  He states that his most recent trigger for depression is his stepfather who was moved into hospice. ?

## 2021-06-27 NOTE — Progress Notes (Signed)
[place] CSW Progress Note ? ?Clinical Social Worker  received call from patient  to discuss prescriptions.  Patient was very tearful because his anxiety medication had not been refilled. Patient stated he has been checking with the pharmacy throughout the weekend. CSW contact oncologist, and RN to requesting someone contacts the patient about his medication. ? ? ?Alexcia Schools , LCSW ?

## 2021-06-29 ENCOUNTER — Inpatient Hospital Stay: Payer: Medicaid Other

## 2021-06-29 ENCOUNTER — Inpatient Hospital Stay: Payer: Medicaid Other | Attending: Radiation Oncology

## 2021-06-29 VITALS — BP 115/78 | HR 55 | Temp 96.3°F | Resp 18

## 2021-06-29 DIAGNOSIS — Z5111 Encounter for antineoplastic chemotherapy: Secondary | ICD-10-CM | POA: Insufficient documentation

## 2021-06-29 DIAGNOSIS — R197 Diarrhea, unspecified: Secondary | ICD-10-CM | POA: Diagnosis not present

## 2021-06-29 DIAGNOSIS — C20 Malignant neoplasm of rectum: Secondary | ICD-10-CM | POA: Diagnosis present

## 2021-06-29 DIAGNOSIS — Z79899 Other long term (current) drug therapy: Secondary | ICD-10-CM | POA: Insufficient documentation

## 2021-06-29 LAB — COMPREHENSIVE METABOLIC PANEL
ALT: 20 U/L (ref 0–44)
AST: 28 U/L (ref 15–41)
Albumin: 4 g/dL (ref 3.5–5.0)
Alkaline Phosphatase: 67 U/L (ref 38–126)
Anion gap: 6 (ref 5–15)
BUN: 11 mg/dL (ref 8–23)
CO2: 25 mmol/L (ref 22–32)
Calcium: 8.9 mg/dL (ref 8.9–10.3)
Chloride: 108 mmol/L (ref 98–111)
Creatinine, Ser: 0.68 mg/dL (ref 0.61–1.24)
GFR, Estimated: >60 mL/min (ref >60)
Glucose, Bld: 98 mg/dL (ref 70–99)
Potassium: 3.6 mmol/L (ref 3.5–5.1)
Sodium: 139 mmol/L (ref 135–145)
Total Bilirubin: 0.5 mg/dL (ref 0.3–1.2)
Total Protein: 6.7 g/dL (ref 6.5–8.1)

## 2021-06-29 LAB — CBC WITH DIFFERENTIAL/PLATELET
Abs Immature Granulocytes: 0.02 10*3/uL (ref 0.00–0.07)
Basophils Absolute: 0 10*3/uL (ref 0.0–0.1)
Basophils Relative: 1 %
Eosinophils Absolute: 0.3 10*3/uL (ref 0.0–0.5)
Eosinophils Relative: 6 %
HCT: 34.4 % — ABNORMAL LOW (ref 39.0–52.0)
Hemoglobin: 11.8 g/dL — ABNORMAL LOW (ref 13.0–17.0)
Immature Granulocytes: 1 %
Lymphocytes Relative: 18 %
Lymphs Abs: 0.7 10*3/uL (ref 0.7–4.0)
MCH: 34.3 pg — ABNORMAL HIGH (ref 26.0–34.0)
MCHC: 34.3 g/dL (ref 30.0–36.0)
MCV: 100 fL (ref 80.0–100.0)
Monocytes Absolute: 0.6 10*3/uL (ref 0.1–1.0)
Monocytes Relative: 16 %
Neutro Abs: 2.3 10*3/uL (ref 1.7–7.7)
Neutrophils Relative %: 58 %
Platelets: 127 10*3/uL — ABNORMAL LOW (ref 150–400)
RBC: 3.44 MIL/uL — ABNORMAL LOW (ref 4.22–5.81)
RDW: 16.4 % — ABNORMAL HIGH (ref 11.5–15.5)
WBC: 3.9 10*3/uL — ABNORMAL LOW (ref 4.0–10.5)
nRBC: 0 % (ref 0.0–0.2)

## 2021-06-29 MED ORDER — SODIUM CHLORIDE 0.9 % IV SOLN
10.0000 mg | Freq: Once | INTRAVENOUS | Status: AC
  Administered 2021-06-29: 10 mg via INTRAVENOUS
  Filled 2021-06-29: qty 1
  Filled 2021-06-29: qty 10

## 2021-06-29 MED ORDER — SODIUM CHLORIDE 0.9 % IV SOLN
2400.0000 mg/m2 | INTRAVENOUS | Status: DC
  Administered 2021-06-29: 4000 mg via INTRAVENOUS
  Filled 2021-06-29 (×3): qty 80

## 2021-06-29 MED ORDER — PALONOSETRON HCL INJECTION 0.25 MG/5ML
0.2500 mg | Freq: Once | INTRAVENOUS | Status: AC
  Administered 2021-06-29: 0.25 mg via INTRAVENOUS

## 2021-06-29 MED ORDER — LEUCOVORIN CALCIUM INJECTION 350 MG
400.0000 mg/m2 | Freq: Once | INTRAVENOUS | Status: AC
  Administered 2021-06-29: 664 mg via INTRAVENOUS
  Filled 2021-06-29: qty 33.2

## 2021-06-29 MED ORDER — OXALIPLATIN CHEMO INJECTION 100 MG/20ML
60.0000 mg/m2 | Freq: Once | INTRAVENOUS | Status: AC
  Administered 2021-06-29: 100 mg via INTRAVENOUS
  Filled 2021-06-29 (×2): qty 20

## 2021-06-29 MED ORDER — DEXTROSE 5 % IV SOLN
Freq: Once | INTRAVENOUS | Status: AC
  Filled 2021-06-29: qty 250

## 2021-06-29 NOTE — Patient Instructions (Signed)
[facility] CANCER CTR AT [place]  Discharge Instructions: ?Thank you for choosing [facility] to provide your oncology and hematology care.  ?If you have a lab appointment with the [facility], please go directly to the [facility] and check in at the registration area. ? ?Wear comfortable clothing and clothing appropriate for easy access to any Portacath or PICC line.  ? ?We strive to give you quality time with your provider. You may need to reschedule your appointment if you arrive late (15 or more minutes).  Arriving late affects you and other patients whose appointments are after yours.  Also, if you miss three or more appointments without notifying the office, you may be dismissed from the clinic at the provider?s discretion.    ?  ?For prescription refill requests, have your pharmacy contact our office and allow 72 hours for refills to be completed.   ? ?Today you received the following chemotherapy and/or immunotherapy agents: Folfox ?To help prevent nausea and vomiting after your treatment, we encourage you to take your nausea medication as directed. ? ?BELOW ARE SYMPTOMS THAT SHOULD BE REPORTED IMMEDIATELY: ?*FEVER GREATER THAN 100.4 F (38 ?C) OR HIGHER ?*CHILLS OR SWEATING ?*NAUSEA AND VOMITING THAT IS NOT CONTROLLED WITH YOUR NAUSEA MEDICATION ?*UNUSUAL SHORTNESS OF BREATH ?*UNUSUAL BRUISING OR BLEEDING ?*URINARY PROBLEMS (pain or burning when urinating, or frequent urination) ?*BOWEL PROBLEMS (unusual diarrhea, constipation, pain near the anus) ?TENDERNESS IN MOUTH AND THROAT WITH OR WITHOUT PRESENCE OF ULCERS (sore throat, sores in mouth, or a toothache) ?UNUSUAL RASH, SWELLING OR PAIN  ?UNUSUAL VAGINAL DISCHARGE OR ITCHING  ? ?Items with * indicate a potential emergency and should be followed up as soon as possible or go to the Emergency Department if any problems should occur. ? ?Please show the CHEMOTHERAPY ALERT CARD or IMMUNOTHERAPY ALERT CARD at check-in to the  Emergency Department and triage nurse. ? ?Should you have questions after your visit or need to cancel or reschedule your appointment, please contact [facility] CANCER [place] AT [place]  [phone] and follow the prompts.  Office hours are 8:00 a.m. to 4:30 p.m. Monday - Friday. Please note that voicemails left after 4:00 p.m. may not be returned until the following business day.  We are closed weekends and major holidays. You have access to a nurse at all times for urgent questions. Please call the main number to the clinic [phone] and follow the prompts. ? ?For any non-urgent questions, you may also contact your provider using MyChart. We now offer e-Visits for anyone 11 and older to request care online for non-urgent symptoms. For details visit [email or web address]. ?  ?Also download the MyChart app! Go to the app store, search "MyChart", open the app, select [facility], and log in with your MyChart username and password. ? ?Due to Covid, a mask is required upon entering the hospital/clinic. If you do not have a mask, one will be given to you upon arrival. For doctor visits, patients may have 1 support person aged 8 or older with them. For treatment visits, patients cannot have anyone with them due to current Covid guidelines and our immunocompromised population.  ?

## 2021-07-01 ENCOUNTER — Inpatient Hospital Stay: Payer: Medicaid Other

## 2021-07-01 DIAGNOSIS — C20 Malignant neoplasm of rectum: Secondary | ICD-10-CM

## 2021-07-01 DIAGNOSIS — Z5111 Encounter for antineoplastic chemotherapy: Secondary | ICD-10-CM | POA: Diagnosis not present

## 2021-07-01 MED ORDER — HEPARIN SOD (PORK) LOCK FLUSH 100 UNIT/ML IV SOLN
500.0000 [IU] | Freq: Once | INTRAVENOUS | Status: AC | PRN
  Administered 2021-07-01: 500 [IU]
  Filled 2021-07-01: qty 5

## 2021-07-01 MED ORDER — SODIUM CHLORIDE 0.9% FLUSH
10.0000 mL | INTRAVENOUS | Status: DC | PRN
  Administered 2021-07-01: 10 mL
  Filled 2021-07-01: qty 10

## 2021-07-13 ENCOUNTER — Inpatient Hospital Stay: Payer: Medicaid Other

## 2021-07-13 ENCOUNTER — Encounter: Payer: Self-pay | Admitting: Internal Medicine

## 2021-07-13 ENCOUNTER — Inpatient Hospital Stay (HOSPITAL_BASED_OUTPATIENT_CLINIC_OR_DEPARTMENT_OTHER): Payer: Medicaid Other | Admitting: Internal Medicine

## 2021-07-13 DIAGNOSIS — Z5111 Encounter for antineoplastic chemotherapy: Secondary | ICD-10-CM | POA: Diagnosis not present

## 2021-07-13 DIAGNOSIS — C20 Malignant neoplasm of rectum: Secondary | ICD-10-CM | POA: Diagnosis not present

## 2021-07-13 LAB — COMPREHENSIVE METABOLIC PANEL
ALT: 21 U/L (ref 0–44)
AST: 34 U/L (ref 15–41)
Albumin: 4 g/dL (ref 3.5–5.0)
Alkaline Phosphatase: 72 U/L (ref 38–126)
Anion gap: 4 — ABNORMAL LOW (ref 5–15)
BUN: 6 mg/dL — ABNORMAL LOW (ref 8–23)
CO2: 27 mmol/L (ref 22–32)
Calcium: 8.9 mg/dL (ref 8.9–10.3)
Chloride: 107 mmol/L (ref 98–111)
Creatinine, Ser: 0.74 mg/dL (ref 0.61–1.24)
GFR, Estimated: >60 mL/min (ref >60)
Glucose, Bld: 87 mg/dL (ref 70–99)
Potassium: 3.7 mmol/L (ref 3.5–5.1)
Sodium: 138 mmol/L (ref 135–145)
Total Bilirubin: 0.9 mg/dL (ref 0.3–1.2)
Total Protein: 6.7 g/dL (ref 6.5–8.1)

## 2021-07-13 LAB — CBC WITH DIFFERENTIAL/PLATELET
Abs Immature Granulocytes: 0.01 10*3/uL (ref 0.00–0.07)
Basophils Absolute: 0 10*3/uL (ref 0.0–0.1)
Basophils Relative: 1 %
Eosinophils Absolute: 0.2 10*3/uL (ref 0.0–0.5)
Eosinophils Relative: 6 %
HCT: 33.6 % — ABNORMAL LOW (ref 39.0–52.0)
Hemoglobin: 11.7 g/dL — ABNORMAL LOW (ref 13.0–17.0)
Immature Granulocytes: 0 %
Lymphocytes Relative: 20 %
Lymphs Abs: 0.8 10*3/uL (ref 0.7–4.0)
MCH: 34.5 pg — ABNORMAL HIGH (ref 26.0–34.0)
MCHC: 34.8 g/dL (ref 30.0–36.0)
MCV: 99.1 fL (ref 80.0–100.0)
Monocytes Absolute: 0.6 10*3/uL (ref 0.1–1.0)
Monocytes Relative: 16 %
Neutro Abs: 2.2 10*3/uL (ref 1.7–7.7)
Neutrophils Relative %: 57 %
Platelets: 86 10*3/uL — ABNORMAL LOW (ref 150–400)
RBC: 3.39 MIL/uL — ABNORMAL LOW (ref 4.22–5.81)
RDW: 15.9 % — ABNORMAL HIGH (ref 11.5–15.5)
WBC: 3.8 10*3/uL — ABNORMAL LOW (ref 4.0–10.5)
nRBC: 0 % (ref 0.0–0.2)

## 2021-07-13 MED ORDER — DIPHENOXYLATE-ATROPINE 2.5-0.025 MG PO TABS: ORAL_TABLET | ORAL | 1 refills | Status: DC

## 2021-07-13 MED ORDER — SODIUM CHLORIDE 0.9% FLUSH
10.0000 mL | Freq: Once | INTRAVENOUS | Status: AC
  Administered 2021-07-13: 10 mL via INTRAVENOUS
  Filled 2021-07-13: qty 10

## 2021-07-13 MED ORDER — SODIUM CHLORIDE 0.9 % IV SOLN
10.0000 mg | Freq: Once | INTRAVENOUS | Status: AC
  Administered 2021-07-13: 10 mg via INTRAVENOUS
  Filled 2021-07-13: qty 10

## 2021-07-13 MED ORDER — DEXTROSE 5 % IV SOLN
Freq: Once | INTRAVENOUS | Status: AC
  Filled 2021-07-13: qty 250

## 2021-07-13 MED ORDER — OXALIPLATIN CHEMO INJECTION 100 MG/20ML
60.0000 mg/m2 | Freq: Once | INTRAVENOUS | Status: AC
  Administered 2021-07-13: 100 mg via INTRAVENOUS
  Filled 2021-07-13: qty 20

## 2021-07-13 MED ORDER — SODIUM CHLORIDE 0.9 % IV SOLN
2400.0000 mg/m2 | INTRAVENOUS | Status: DC
  Administered 2021-07-13: 4000 mg via INTRAVENOUS
  Filled 2021-07-13: qty 80

## 2021-07-13 MED ORDER — PALONOSETRON HCL INJECTION 0.25 MG/5ML
0.2500 mg | Freq: Once | INTRAVENOUS | Status: AC
  Administered 2021-07-13: 0.25 mg via INTRAVENOUS
  Filled 2021-07-13: qty 5

## 2021-07-13 MED ORDER — LEUCOVORIN CALCIUM INJECTION 350 MG
400.0000 mg/m2 | Freq: Once | INTRAVENOUS | Status: AC
  Administered 2021-07-13: 664 mg via INTRAVENOUS
  Filled 2021-07-13: qty 33.2

## 2021-07-13 MED ORDER — HEPARIN SOD (PORK) LOCK FLUSH 100 UNIT/ML IV SOLN
500.0000 [IU] | Freq: Once | INTRAVENOUS | Status: DC
  Filled 2021-07-13: qty 5
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
## 2021-07-13 NOTE — Progress Notes (Signed)
Nutrition Follow-up: ? ?Patient with locally advanced rectal cancer currently receiving neoadjuvant chemotherapy.   ? ?Met with patient during infusion.  Patient reports that his appetite is ok.  "I have to force myself to eat especially with pump on."  Usually is not hungry in the morning but appetite picks up during the day.  He has been helping his mother out recently and eating more with her.  Says that he has maybe 6-8 more vanilla boost shakes, mixing with chocolate syrup.   ? ?Diarrhea continues and taking lomotil ? ?Medications: reviewed ? ?Labs: reviewed ? ?Anthropometrics:  ? ?Weight 127 lb 3.2 oz today ? ?126 lb 9.6 oz on 3/15 ?125 lb on 3/1 ? ? ?NUTRITION DIAGNOSIS: Inadequate oral intake stable ? ? ?INTERVENTION:  ?Provided patient with re-order phone number for [employer].  Patient to call and request chocolate boost shakes as he prefers this flavor.   ?Encouraged patient to continue antidiarrheal medication.  ?  ? ?MONITORING, EVALUATION, GOAL: weight trends, intake ? ? ?NEXT VISIT: Wednesday, May 3rd during infusion ? ?Joli B. Allen, RD, LDN ?Registered Dietitian ?[phone] ? ? ?

## 2021-07-13 NOTE — Assessment & Plan Note (Addendum)
#   Rectal cancer-  locally advanced disease. MRI- OCT 2022- T4bN2; ? M [see below].  s/p  concurrent chemoradiation with Xeloda. Radiation [finished DEC 12th, 2022].  March 23, 2021-CT scan shows Continued circumferential wall thickening in the lower rectum suspicious for tumor. No pathologic adenopathy identified. See discussion below re: Lung nodule. ? ?# Continue with neo-adjuvant  FOLFOX chemotherapy every 2 weeks x 8 treatments; Today# 7/8   S/p  surgery evaluation at [facility] jan 2023.  . Labs today reviewed;platelets- 86   acceptable for treatment today. continue Oxliplatin with 20% reduction.  Discussed that in 2 weeks patient will finish his last chemotherapy.  We will plan restaging-and referred back to [facility] for further evaluation/surgical options. ? 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  ? ?# Diarrhea- G-2;from 5 FU-  Continue  Lomotil as needed- prn. STABLE.  ? ?# PET 2022-LEFT lower lobe nodule with increased metabolic activity suspicious for either metastatic colorectal neoplasm or second primary.  Mildly reduced in size, currently 0.8 by 0.6 cm [ formerly 1.1 by 0.9 cm]-monitor for now.  ;  Await restaging scan in a month from now. ? 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  As per Education officer, museum transportation provided.  Again importance of keeping appointment stressed to the patient. ? ?# Insmonia- add trazadone 50 mg qhs prn. Pt non-compliant.  ? ?#Iron deficient anemia hemoglobin 12-secondary to chronic GI bleed iron deficiency;HOLD off venofer-as hemoglobin is-STABLE.  ? ?Lucianne Lei pt ?# DISPOSITION: ?# Proceed with FOLFOX chemo- ?# 1 week- labs- cbc/bmp- Possible 1 lit IVFs/dex 10 mg.  ?# Follow up 2 weeks- MD; labs- cbc/cmp; FOLFOX; pump off d-2 days later---Dr.B ? ? ? ? ?

## 2021-07-13 NOTE — Patient Instructions (Signed)
[facility] CANCER CTR AT [place]  Discharge Instructions: ?Thank you for choosing [facility] to provide your oncology and hematology care.  ?If you have a lab appointment with the [facility], please go directly to the [facility] and check in at the registration area. ? ?Wear comfortable clothing and clothing appropriate for easy access to any Portacath or PICC line.  ? ?We strive to give you quality time with your provider. You may need to reschedule your appointment if you arrive late (15 or more minutes).  Arriving late affects you and other patients whose appointments are after yours.  Also, if you miss three or more appointments without notifying the office, you may be dismissed from the clinic at the provider?s discretion.    ?  ?For prescription refill requests, have your pharmacy contact our office and allow 72 hours for refills to be completed.   ? ?Today you received the following chemotherapy and/or immunotherapy agents FOLFOX    ?  ?To help prevent nausea and vomiting after your treatment, we encourage you to take your nausea medication as directed. ? ?BELOW ARE SYMPTOMS THAT SHOULD BE REPORTED IMMEDIATELY: ?*FEVER GREATER THAN 100.4 F (38 ?C) OR HIGHER ?*CHILLS OR SWEATING ?*NAUSEA AND VOMITING THAT IS NOT CONTROLLED WITH YOUR NAUSEA MEDICATION ?*UNUSUAL SHORTNESS OF BREATH ?*UNUSUAL BRUISING OR BLEEDING ?*URINARY PROBLEMS (pain or burning when urinating, or frequent urination) ?*BOWEL PROBLEMS (unusual diarrhea, constipation, pain near the anus) ?TENDERNESS IN MOUTH AND THROAT WITH OR WITHOUT PRESENCE OF ULCERS (sore throat, sores in mouth, or a toothache) ?UNUSUAL RASH, SWELLING OR PAIN  ?UNUSUAL VAGINAL DISCHARGE OR ITCHING  ? ?Items with * indicate a potential emergency and should be followed up as soon as possible or go to the Emergency Department if any problems should occur. ? ?Please show the CHEMOTHERAPY ALERT CARD or IMMUNOTHERAPY ALERT CARD at check-in to the  Emergency Department and triage nurse. ? ?Should you have questions after your visit or need to cancel or reschedule your appointment, please contact [facility] CANCER [place] AT [place]  [phone] and follow the prompts.  Office hours are 8:00 a.m. to 4:30 p.m. Monday - Friday. Please note that voicemails left after 4:00 p.m. may not be returned until the following business day.  We are closed weekends and major holidays. You have access to a nurse at all times for urgent questions. Please call the main number to the clinic [phone] and follow the prompts. ? ?For any non-urgent questions, you may also contact your provider using MyChart. We now offer e-Visits for anyone 18 and older to request care online for non-urgent symptoms. For details visit [email or web address]. ?  ?Also download the MyChart app! Go to the app store, search "MyChart", open the app, select [facility], and log in with your MyChart username and password. ? ?Due to Covid, a mask is required upon entering the hospital/clinic. If you do not have a mask, one will be given to you upon arrival. For doctor visits, patients may have 1 support person aged 34 or older with them. For treatment visits, patients cannot have anyone with them due to current Covid guidelines and our immunocompromised population.  ?

## 2021-07-13 NOTE — Progress Notes (Signed)
C/o with diarrhea. ?

## 2021-07-15 ENCOUNTER — Inpatient Hospital Stay: Payer: Medicaid Other

## 2021-07-15 VITALS — BP 100/62 | HR 62 | Resp 18

## 2021-07-15 DIAGNOSIS — Z5111 Encounter for antineoplastic chemotherapy: Secondary | ICD-10-CM | POA: Diagnosis not present

## 2021-07-15 DIAGNOSIS — C20 Malignant neoplasm of rectum: Secondary | ICD-10-CM

## 2021-07-15 MED ORDER — SODIUM CHLORIDE 0.9% FLUSH
10.0000 mL | INTRAVENOUS | Status: DC | PRN
  Administered 2021-07-15: 10 mL
  Filled 2021-07-15: qty 10

## 2021-07-15 MED ORDER — HEPARIN SOD (PORK) LOCK FLUSH 100 UNIT/ML IV SOLN
500.0000 [IU] | Freq: Once | INTRAVENOUS | Status: AC | PRN
  Administered 2021-07-15: 500 [IU]
  Filled 2021-07-15: qty 5

## 2021-07-20 ENCOUNTER — Inpatient Hospital Stay: Payer: Medicaid Other

## 2021-07-20 VITALS — BP 100/65 | HR 58 | Temp 96.8°F | Resp 18

## 2021-07-20 DIAGNOSIS — R531 Weakness: Secondary | ICD-10-CM

## 2021-07-20 DIAGNOSIS — Z5111 Encounter for antineoplastic chemotherapy: Secondary | ICD-10-CM | POA: Diagnosis not present

## 2021-07-20 DIAGNOSIS — C20 Malignant neoplasm of rectum: Secondary | ICD-10-CM

## 2021-07-20 DIAGNOSIS — E611 Iron deficiency: Secondary | ICD-10-CM

## 2021-07-20 LAB — CBC WITH DIFFERENTIAL/PLATELET
Abs Immature Granulocytes: 0.01 10*3/uL (ref 0.00–0.07)
Basophils Absolute: 0 10*3/uL (ref 0.0–0.1)
Basophils Relative: 1 %
Eosinophils Absolute: 0.2 10*3/uL (ref 0.0–0.5)
Eosinophils Relative: 5 %
HCT: 34.3 % — ABNORMAL LOW (ref 39.0–52.0)
Hemoglobin: 11.6 g/dL — ABNORMAL LOW (ref 13.0–17.0)
Immature Granulocytes: 0 %
Lymphocytes Relative: 21 %
Lymphs Abs: 0.8 10*3/uL (ref 0.7–4.0)
MCH: 34 pg (ref 26.0–34.0)
MCHC: 33.8 g/dL (ref 30.0–36.0)
MCV: 100.6 fL — ABNORMAL HIGH (ref 80.0–100.0)
Monocytes Absolute: 0.5 10*3/uL (ref 0.1–1.0)
Monocytes Relative: 14 %
Neutro Abs: 2.3 10*3/uL (ref 1.7–7.7)
Neutrophils Relative %: 59 %
Platelets: 81 10*3/uL — ABNORMAL LOW (ref 150–400)
RBC: 3.41 MIL/uL — ABNORMAL LOW (ref 4.22–5.81)
RDW: 15.3 % (ref 11.5–15.5)
WBC: 3.8 10*3/uL — ABNORMAL LOW (ref 4.0–10.5)
nRBC: 0 % (ref 0.0–0.2)

## 2021-07-20 LAB — BASIC METABOLIC PANEL
Anion gap: 7 (ref 5–15)
BUN: 13 mg/dL (ref 8–23)
CO2: 27 mmol/L (ref 22–32)
Calcium: 9.4 mg/dL (ref 8.9–10.3)
Chloride: 105 mmol/L (ref 98–111)
Creatinine, Ser: 0.71 mg/dL (ref 0.61–1.24)
GFR, Estimated: >60 mL/min (ref >60)
Glucose, Bld: 99 mg/dL (ref 70–99)
Potassium: 3.9 mmol/L (ref 3.5–5.1)
Sodium: 139 mmol/L (ref 135–145)

## 2021-07-20 MED ORDER — SODIUM CHLORIDE 0.9% FLUSH
10.0000 mL | Freq: Once | INTRAVENOUS | Status: AC | PRN
  Administered 2021-07-20: 10 mL
  Filled 2021-07-20: qty 10

## 2021-07-20 MED ORDER — DEXAMETHASONE SODIUM PHOSPHATE 10 MG/ML IJ SOLN: 10.0000 mg | Freq: Once | INTRAMUSCULAR | Status: DC

## 2021-07-20 MED ORDER — SODIUM CHLORIDE 0.9 % IV SOLN
Freq: Once | INTRAVENOUS | Status: AC
  Filled 2021-07-20: qty 250

## 2021-07-20 MED ORDER — SODIUM CHLORIDE 0.9 % IV SOLN
10.0000 mg | Freq: Once | INTRAVENOUS | Status: AC
  Administered 2021-07-20: 10 mg via INTRAVENOUS
  Filled 2021-07-20: qty 10

## 2021-07-20 MED ORDER — HEPARIN SOD (PORK) LOCK FLUSH 100 UNIT/ML IV SOLN
500.0000 [IU] | Freq: Once | INTRAVENOUS | Status: AC | PRN
  Administered 2021-07-20: 500 [IU]
  Filled 2021-07-20: qty 5

## 2021-07-27 ENCOUNTER — Inpatient Hospital Stay (HOSPITAL_BASED_OUTPATIENT_CLINIC_OR_DEPARTMENT_OTHER): Payer: Medicaid Other | Admitting: Internal Medicine

## 2021-07-27 ENCOUNTER — Inpatient Hospital Stay: Payer: Medicaid Other

## 2021-07-27 ENCOUNTER — Encounter: Payer: Self-pay | Admitting: Internal Medicine

## 2021-07-27 ENCOUNTER — Inpatient Hospital Stay: Payer: Medicaid Other | Attending: Radiation Oncology

## 2021-07-27 DIAGNOSIS — Z79899 Other long term (current) drug therapy: Secondary | ICD-10-CM | POA: Insufficient documentation

## 2021-07-27 DIAGNOSIS — C7800 Secondary malignant neoplasm of unspecified lung: Secondary | ICD-10-CM | POA: Diagnosis not present

## 2021-07-27 DIAGNOSIS — T451X5A Adverse effect of antineoplastic and immunosuppressive drugs, initial encounter: Secondary | ICD-10-CM | POA: Insufficient documentation

## 2021-07-27 DIAGNOSIS — F419 Anxiety disorder, unspecified: Secondary | ICD-10-CM | POA: Diagnosis not present

## 2021-07-27 DIAGNOSIS — Z5111 Encounter for antineoplastic chemotherapy: Secondary | ICD-10-CM | POA: Insufficient documentation

## 2021-07-27 DIAGNOSIS — G620 Drug-induced polyneuropathy: Secondary | ICD-10-CM | POA: Diagnosis not present

## 2021-07-27 DIAGNOSIS — D509 Iron deficiency anemia, unspecified: Secondary | ICD-10-CM | POA: Diagnosis not present

## 2021-07-27 DIAGNOSIS — C20 Malignant neoplasm of rectum: Secondary | ICD-10-CM | POA: Insufficient documentation

## 2021-07-27 DIAGNOSIS — R197 Diarrhea, unspecified: Secondary | ICD-10-CM | POA: Insufficient documentation

## 2021-07-27 DIAGNOSIS — Z87891 Personal history of nicotine dependence: Secondary | ICD-10-CM | POA: Insufficient documentation

## 2021-07-27 LAB — COMPREHENSIVE METABOLIC PANEL
ALT: 27 U/L (ref 0–44)
AST: 43 U/L — ABNORMAL HIGH (ref 15–41)
Albumin: 4.3 g/dL (ref 3.5–5.0)
Alkaline Phosphatase: 83 U/L (ref 38–126)
Anion gap: 3 — ABNORMAL LOW (ref 5–15)
BUN: 11 mg/dL (ref 8–23)
CO2: 25 mmol/L (ref 22–32)
Calcium: 8.7 mg/dL — ABNORMAL LOW (ref 8.9–10.3)
Chloride: 102 mmol/L (ref 98–111)
Creatinine, Ser: 0.67 mg/dL (ref 0.61–1.24)
GFR, Estimated: >60 mL/min (ref >60)
Glucose, Bld: 91 mg/dL (ref 70–99)
Potassium: 4.2 mmol/L (ref 3.5–5.1)
Sodium: 130 mmol/L — ABNORMAL LOW (ref 135–145)
Total Bilirubin: 1.1 mg/dL (ref 0.3–1.2)
Total Protein: 7.2 g/dL (ref 6.5–8.1)

## 2021-07-27 LAB — CBC WITH DIFFERENTIAL/PLATELET
Abs Immature Granulocytes: 0.02 10*3/uL (ref 0.00–0.07)
Basophils Absolute: 0 10*3/uL (ref 0.0–0.1)
Basophils Relative: 0 %
Eosinophils Absolute: 0.3 10*3/uL (ref 0.0–0.5)
Eosinophils Relative: 5 %
HCT: 36.7 % — ABNORMAL LOW (ref 39.0–52.0)
Hemoglobin: 12.8 g/dL — ABNORMAL LOW (ref 13.0–17.0)
Immature Granulocytes: 0 %
Lymphocytes Relative: 18 %
Lymphs Abs: 0.9 10*3/uL (ref 0.7–4.0)
MCH: 34.6 pg — ABNORMAL HIGH (ref 26.0–34.0)
MCHC: 34.9 g/dL (ref 30.0–36.0)
MCV: 99.2 fL (ref 80.0–100.0)
Monocytes Absolute: 0.8 10*3/uL (ref 0.1–1.0)
Monocytes Relative: 16 %
Neutro Abs: 3 10*3/uL (ref 1.7–7.7)
Neutrophils Relative %: 61 %
Platelets: 102 10*3/uL — ABNORMAL LOW (ref 150–400)
RBC: 3.7 MIL/uL — ABNORMAL LOW (ref 4.22–5.81)
RDW: 15.3 % (ref 11.5–15.5)
WBC: 4.9 10*3/uL (ref 4.0–10.5)
nRBC: 0 % (ref 0.0–0.2)

## 2021-07-27 MED ORDER — PALONOSETRON HCL INJECTION 0.25 MG/5ML
0.2500 mg | Freq: Once | INTRAVENOUS | Status: AC
  Administered 2021-07-27: 0.25 mg via INTRAVENOUS
  Filled 2021-07-27: qty 5

## 2021-07-27 MED ORDER — SODIUM CHLORIDE 0.9 % IV SOLN
2400.0000 mg/m2 | INTRAVENOUS | Status: DC
  Administered 2021-07-27: 4000 mg via INTRAVENOUS
  Filled 2021-07-27: qty 80

## 2021-07-27 MED ORDER — SODIUM CHLORIDE 0.9 % IV SOLN
10.0000 mg | Freq: Once | INTRAVENOUS | Status: AC
  Administered 2021-07-27: 10 mg via INTRAVENOUS
  Filled 2021-07-27: qty 10

## 2021-07-27 MED ORDER — DEXTROSE 5 % IV SOLN
Freq: Once | INTRAVENOUS | Status: AC
  Filled 2021-07-27: qty 250

## 2021-07-27 MED ORDER — LEUCOVORIN CALCIUM INJECTION 350 MG
400.0000 mg/m2 | Freq: Once | INTRAVENOUS | Status: AC
  Administered 2021-07-27: 664 mg via INTRAVENOUS
  Filled 2021-07-27: qty 33.2

## 2021-07-27 MED ORDER — OXALIPLATIN CHEMO INJECTION 100 MG/20ML
60.0000 mg/m2 | Freq: Once | INTRAVENOUS | Status: AC
  Administered 2021-07-27: 100 mg via INTRAVENOUS
  Filled 2021-07-27: qty 20

## 2021-07-27 MED ORDER — CLONAZEPAM 1 MG PO TABS
1.0000 mg | ORAL_TABLET | Freq: Two times a day (BID) | ORAL | 0 refills | Status: DC | PRN
Start: 2021-07-27 — End: 2021-08-29
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
## 2021-07-27 NOTE — Patient Instructions (Addendum)
[facility] CANCER CTR AT [place]  Discharge Instructions: ?Thank you for choosing [facility] to provide your oncology and hematology care.  ?If you have a lab appointment with the [facility], please go directly to the [facility] and check in at the registration area. ? ?Wear comfortable clothing and clothing appropriate for easy access to any Portacath or PICC line.  ? ?We strive to give you quality time with your provider. You may need to reschedule your appointment if you arrive late (15 or more minutes).  Arriving late affects you and other patients whose appointments are after yours.  Also, if you miss three or more appointments without notifying the office, you may be dismissed from the clinic at the provider?s discretion.    ?  ?For prescription refill requests, have your pharmacy contact our office and allow 72 hours for refills to be completed.   ? ?Today you received the following chemotherapy and/or immunotherapy agents Oxaliplatin, leucovorin, adrucil  ?  ?To help prevent nausea and vomiting after your treatment, we encourage you to take your nausea medication as directed. ? ?BELOW ARE SYMPTOMS THAT SHOULD BE REPORTED IMMEDIATELY: ?*FEVER GREATER THAN 100.4 F (38 ?C) OR HIGHER ?*CHILLS OR SWEATING ?*NAUSEA AND VOMITING THAT IS NOT CONTROLLED WITH YOUR NAUSEA MEDICATION ?*UNUSUAL SHORTNESS OF BREATH ?*UNUSUAL BRUISING OR BLEEDING ?*URINARY PROBLEMS (pain or burning when urinating, or frequent urination) ?*BOWEL PROBLEMS (unusual diarrhea, constipation, pain near the anus) ?TENDERNESS IN MOUTH AND THROAT WITH OR WITHOUT PRESENCE OF ULCERS (sore throat, sores in mouth, or a toothache) ?UNUSUAL RASH, SWELLING OR PAIN  ?UNUSUAL VAGINAL DISCHARGE OR ITCHING  ? ?Items with * indicate a potential emergency and should be followed up as soon as possible or go to the Emergency Department if any problems should occur. ? ?Please show the CHEMOTHERAPY ALERT CARD or IMMUNOTHERAPY ALERT  CARD at check-in to the Emergency Department and triage nurse. ? ?Should you have questions after your visit or need to cancel or reschedule your appointment, please contact [facility] CANCER [place] AT [place]  [phone] and follow the prompts.  Office hours are 8:00 a.m. to 4:30 p.m. Monday - Friday. Please note that voicemails left after 4:00 p.m. may not be returned until the following business day.  We are closed weekends and major holidays. You have access to a nurse at all times for urgent questions. Please call the main number to the clinic [phone] and follow the prompts. ? ?For any non-urgent questions, you may also contact your provider using MyChart. We now offer e-Visits for anyone 75 and older to request care online for non-urgent symptoms. For details visit [email or web address]. ?  ?Also download the MyChart app! Go to the app store, search "MyChart", open the app, select [place], and log in with your MyChart username and password. ? ?Due to Covid, a mask is required upon entering the hospital/clinic. If you do not have a mask, one will be given to you upon arrival. For doctor visits, patients may have 1 support person aged 58 or older with them. For treatment visits, patients cannot have anyone with them due to current Covid guidelines and our immunocompromised population.  ?

## 2021-07-27 NOTE — Patient Instructions (Addendum)
[facility]   ?[place]   ?[address]   ?[place], [place] [address]   ?[phone]    ?Carmelina Peal, MD   ?[place]   ?[place],  [address]   ?[phone] (Work)   ?[phone] (Fax) ? ?#Call the bone numbers-and make an appointment with the [place] surgeon in approximately 1 month.  We will get repeat scans in about 3 weeks from now.Marland Kitchen  ?

## 2021-07-27 NOTE — Assessment & Plan Note (Addendum)
#   Rectal cancer-  locally advanced disease. MRI- OCT 2022- T4bN2; ? M [see below].  s/p  concurrent chemoradiation with Xeloda. Radiation [finished DEC 12th, 2022].  March 23, 2021-CT scan shows Continued circumferential wall thickening in the lower rectum suspicious for tumor. No pathologic adenopathy identified. See discussion below re: Lung nodule. STABLE.  ? ?# Continue with neo-adjuvant  FOLFOX chemotherapy every 2 weeks x 8 treatments; last FOLFOX today# 8/8   S/p  surgery evaluation at [facility] jan 2023.  . Labs today reviewed;platelets- 101. Acceptable for treatment today. continue Oxliplatin with 20% reduction. Will repeat CT scans- CAP; MRI- pelvis/in 3 weeks.  Recommend evaluation with surgery in approximately 4 weeks.  I given the patient phone numbers; also requested nurse navigator to make appointment with [facility] surgery team. ? 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  ? ?# Diarrhea- G-2;from 5 FU-  Continue  Lomotil as needed- prn. STABLE.  ? ?# PET 2022-LEFT lower lobe nodule with increased metabolic activity suspicious for either metastatic colorectal neoplasm or second primary.  Mildly reduced in size, currently 0.8 by 0.6 cm [ formerly 1.1 by 0.9 cm]-monitor for now.   Await restaging CT CAP scans in 3 weeks.  ? 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  Declines-stating transportation issues; given that this was arranged by Education officer, museum. ? ?# Insmonia- add trazadone 50 mg qhs prn. Pt non-compliant.  ? ?# Iron deficient anemia hemoglobin 12-secondary to chronic GI bleed iron deficiency;HOLD off venofer-as hemoglobin is-STABLE.  ? ?Lucianne Lei pt/pend script-kolnpin.  ?# DISPOSITION: ?# Proceed with FOLFOX chemo- ?# 1 week- labs- cbc/bmp- Possible 1 lit IVFs/dex 10 mg.  ?# Follow up 3 weeks- MD; labs- cbc/cmp;; NO cheemo; MRI  rectum; CT CAP prior-Dr.B ? ? ? ? ?

## 2021-07-27 NOTE — Progress Notes (Signed)
Patient reports no appetite and routinely eats an egg sandwich in the morning with small snack in the afternoon, has 3 lb wt loss.  Does drink 3 Boosts a day. ? ?Taking Lomotil scheduled three times a day and diarrhea episodes has decreased to once a day. ?

## 2021-07-27 NOTE — Progress Notes (Signed)
Nutrition Follow-up: ? ?Patient with locally advanced rectal cancer currently receiving neoadjuvant chemotherapy.   ? ?Met with patient during infusion.  Patient says that his appetite is not that good.  "I have to force myself to eat."  Did not eat any breakfast before coming to clinic this morning.  Usually has an egg sandwich.  Says that he was able to get a shipment of chocolate boost plus shakes ([employer]).  He has been drinking 3 a day.  Says that diarrhea is controlled when he takes 4 lomotil pills.  Says he forgot to take them yesterday.   ? ?Says that MD told him to call [facility] about setting up surgery.   ? ? ? ?Medications: reviewed ? ?Labs: reviewed ? ?Anthropometrics:  ? ?Weight 124 lb 3.2 oz ? ?127 lb 3.2 oz on 4/19 ?126 lb 9.6 oz on 3/15 ?125 lb on 3/1 ? ? ?NUTRITION DIAGNOSIS: Inadequate oral intake decreased with weight loss ? ? ?INTERVENTION:  ?Continue oral nutrition supplements, at least TID (1080 calories, 42 g protein) ?Encouraged patient to consume high calorie, high protein foods in preparation for surgery.  ?  ? ?MONITORING, EVALUATION, GOAL: weight trends, intake ? ? ?NEXT VISIT: as needed ? ?Letanya Froh B. Zenia Resides, RD, LDN ?Registered Dietitian ?[phone] ? ? ?

## 2021-07-29 ENCOUNTER — Inpatient Hospital Stay: Payer: Medicaid Other

## 2021-07-29 ENCOUNTER — Telehealth: Payer: Self-pay

## 2021-07-29 DIAGNOSIS — Z5111 Encounter for antineoplastic chemotherapy: Secondary | ICD-10-CM | POA: Diagnosis not present

## 2021-07-29 DIAGNOSIS — C20 Malignant neoplasm of rectum: Secondary | ICD-10-CM

## 2021-07-29 MED ORDER — HEPARIN SOD (PORK) LOCK FLUSH 100 UNIT/ML IV SOLN
500.0000 [IU] | Freq: Once | INTRAVENOUS | Status: AC | PRN
  Administered 2021-07-29: 500 [IU]
  Filled 2021-07-29: qty 5

## 2021-07-29 MED ORDER — SODIUM CHLORIDE 0.9% FLUSH
10.0000 mL | INTRAVENOUS | Status: DC | PRN
  Administered 2021-07-29: 10 mL
  Filled 2021-07-29: qty 10

## 2021-07-29 NOTE — Telephone Encounter (Signed)
Contacted Dr. Jonita Albee office and to arrange follow up for Mr. Favia. Notified that the first available appointment was 09/16/21. I have asked them to contact the GI team to provide an earlier appointment. Will await response. ?

## 2021-08-02 ENCOUNTER — Telehealth: Payer: Self-pay

## 2021-08-02 NOTE — Telephone Encounter (Signed)
Unsuccessful at getting an earlier appointment with Dr. Maxie Better. Accepted the 09/16/21 appointment and again asked them to reach out to his team for a sooner appointment. Follow up imaging is scheduled for 08/12/21. ?

## 2021-08-03 ENCOUNTER — Inpatient Hospital Stay: Payer: Medicaid Other

## 2021-08-03 VITALS — BP 112/70 | HR 57 | Temp 96.5°F | Resp 18

## 2021-08-03 DIAGNOSIS — E611 Iron deficiency: Secondary | ICD-10-CM

## 2021-08-03 DIAGNOSIS — Z5111 Encounter for antineoplastic chemotherapy: Secondary | ICD-10-CM | POA: Diagnosis not present

## 2021-08-03 DIAGNOSIS — C20 Malignant neoplasm of rectum: Secondary | ICD-10-CM

## 2021-08-03 LAB — CBC WITH DIFFERENTIAL/PLATELET
Abs Immature Granulocytes: 0 10*3/uL (ref 0.00–0.07)
Basophils Absolute: 0 10*3/uL (ref 0.0–0.1)
Basophils Relative: 1 %
Eosinophils Absolute: 0.2 10*3/uL (ref 0.0–0.5)
Eosinophils Relative: 4 %
HCT: 34.8 % — ABNORMAL LOW (ref 39.0–52.0)
Hemoglobin: 12.1 g/dL — ABNORMAL LOW (ref 13.0–17.0)
Immature Granulocytes: 0 %
Lymphocytes Relative: 24 %
Lymphs Abs: 0.9 10*3/uL (ref 0.7–4.0)
MCH: 35.1 pg — ABNORMAL HIGH (ref 26.0–34.0)
MCHC: 34.8 g/dL (ref 30.0–36.0)
MCV: 100.9 fL — ABNORMAL HIGH (ref 80.0–100.0)
Monocytes Absolute: 0.4 10*3/uL (ref 0.1–1.0)
Monocytes Relative: 12 %
Neutro Abs: 2.2 10*3/uL (ref 1.7–7.7)
Neutrophils Relative %: 59 %
Platelets: 111 10*3/uL — ABNORMAL LOW (ref 150–400)
RBC: 3.45 MIL/uL — ABNORMAL LOW (ref 4.22–5.81)
RDW: 14.6 % (ref 11.5–15.5)
WBC: 3.6 10*3/uL — ABNORMAL LOW (ref 4.0–10.5)
nRBC: 0 % (ref 0.0–0.2)

## 2021-08-03 LAB — BASIC METABOLIC PANEL
Anion gap: 6 (ref 5–15)
BUN: 11 mg/dL (ref 8–23)
CO2: 24 mmol/L (ref 22–32)
Calcium: 8.7 mg/dL — ABNORMAL LOW (ref 8.9–10.3)
Chloride: 105 mmol/L (ref 98–111)
Creatinine, Ser: 0.71 mg/dL (ref 0.61–1.24)
GFR, Estimated: >60 mL/min (ref >60)
Glucose, Bld: 125 mg/dL — ABNORMAL HIGH (ref 70–99)
Potassium: 3.8 mmol/L (ref 3.5–5.1)
Sodium: 135 mmol/L (ref 135–145)

## 2021-08-03 MED ORDER — SODIUM CHLORIDE 0.9 % IV SOLN
10.0000 mg | Freq: Once | INTRAVENOUS | Status: AC
  Administered 2021-08-03: 10 mg via INTRAVENOUS
  Filled 2021-08-03: qty 10

## 2021-08-03 MED ORDER — SODIUM CHLORIDE 0.9 % IV SOLN
Freq: Once | INTRAVENOUS | Status: AC
  Filled 2021-08-03: qty 250

## 2021-08-03 MED ORDER — HEPARIN SOD (PORK) LOCK FLUSH 100 UNIT/ML IV SOLN
500.0000 [IU] | Freq: Once | INTRAVENOUS | Status: AC | PRN
  Administered 2021-08-03: 500 [IU]
  Filled 2021-08-03: qty 5

## 2021-08-03 MED ORDER — SODIUM CHLORIDE 0.9% FLUSH
10.0000 mL | Freq: Once | INTRAVENOUS | Status: AC | PRN
  Administered 2021-08-03: 10 mL
  Filled 2021-08-03: qty 10

## 2021-08-03 MED ORDER — DEXAMETHASONE SODIUM PHOSPHATE 10 MG/ML IJ SOLN: 10.0000 mg | Freq: Once | INTRAMUSCULAR | Status: DC

## 2021-08-05 ENCOUNTER — Ambulatory Visit: Payer: Medicaid Other | Admitting: Psychiatry

## 2021-08-08 ENCOUNTER — Telehealth: Payer: Self-pay

## 2021-08-08 NOTE — Telephone Encounter (Signed)
Message sent to Dr. Maxie Better to make him aware that his schedule does not have any availability to see [name] [name] back until 6/26.  ?

## 2021-08-11 ENCOUNTER — Other Ambulatory Visit: Payer: Self-pay

## 2021-08-11 ENCOUNTER — Telehealth: Payer: Self-pay

## 2021-08-11 DIAGNOSIS — C20 Malignant neoplasm of rectum: Secondary | ICD-10-CM

## 2021-08-11 NOTE — Telephone Encounter (Signed)
Spoke with [name] [name]. His appointment at [facility] has been moved up to 08/15/21. He was made aware of the change. His son will transport him to appointment. He states his MRI was denied by insurance.

## 2021-08-12 ENCOUNTER — Ambulatory Visit: Payer: Medicaid Other

## 2021-08-12 ENCOUNTER — Inpatient Hospital Stay: Payer: Medicaid Other

## 2021-08-12 ENCOUNTER — Ambulatory Visit: Admission: RE | Admit: 2021-08-12 | Payer: Medicaid Other | Source: Ambulatory Visit

## 2021-08-12 ENCOUNTER — Ambulatory Visit
Admission: RE | Admit: 2021-08-12 | Discharge: 2021-08-12 | Disposition: A | Discharge status: Home / Self Care | Payer: Medicaid Other | Source: Ambulatory Visit | Attending: Internal Medicine | Admitting: Internal Medicine

## 2021-08-12 DIAGNOSIS — C20 Malignant neoplasm of rectum: Secondary | ICD-10-CM | POA: Diagnosis present

## 2021-08-12 IMAGING — CT CT ABD-PELV W/ CM
2 of 5 series · 15 of 46 positions shown, 17 images · IV contrast (agent unspecified)
Comparison: CT chest abdomen pelvis, [DATE]

CLINICAL DATA: Rectal cancer, assess treatment response * Tracking
Code: BO *

EXAM:
CT ABDOMEN AND PELVIS WITH CONTRAST
TECHNIQUE: Multidetector CT imaging of the abdomen and pelvis was performed
using the standard protocol following bolus administration of
intravenous contrast.

[Series 2: axial st · axial · 0.57mm/px · z∈[-817,-397]mm · 12 of 96 slices shown, 14 images]
[im 6/96  soft-tissue]
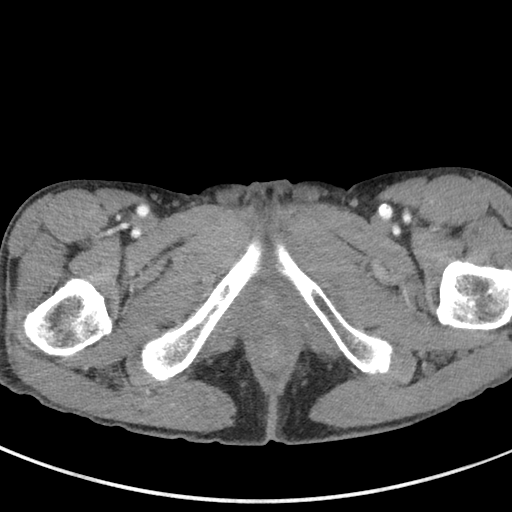
[im 6/96  bone]
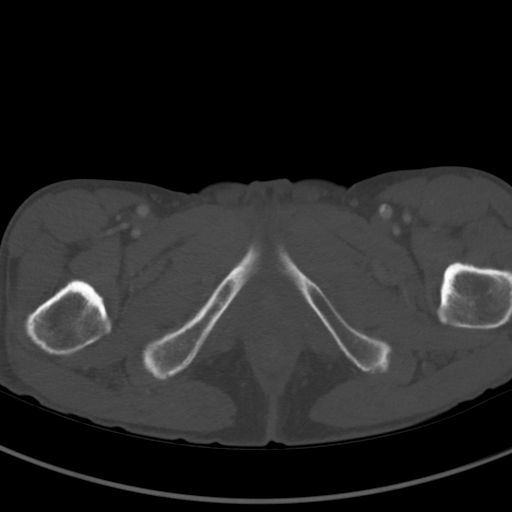
[im 16/96  soft-tissue]
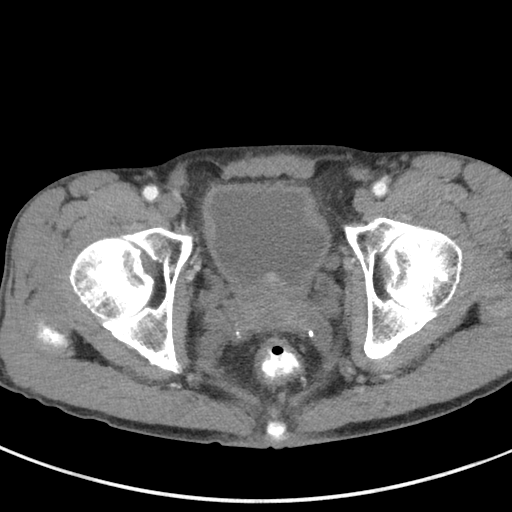
[im 22/96  soft-tissue]
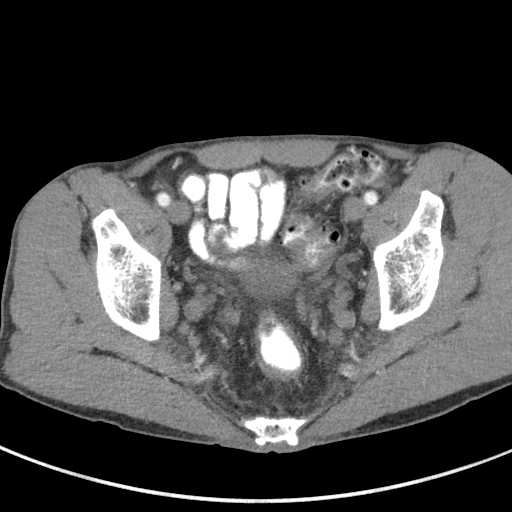
[im 27/96  soft-tissue]
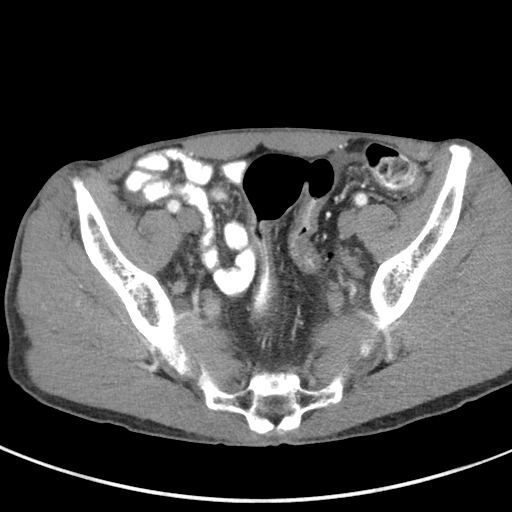
[im 37/96  soft-tissue]
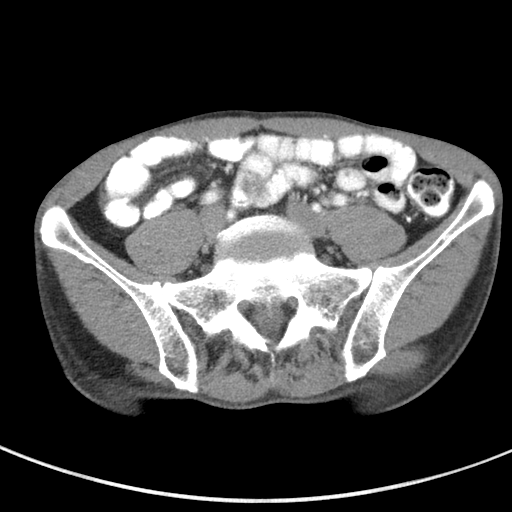
[im 43/96  soft-tissue]
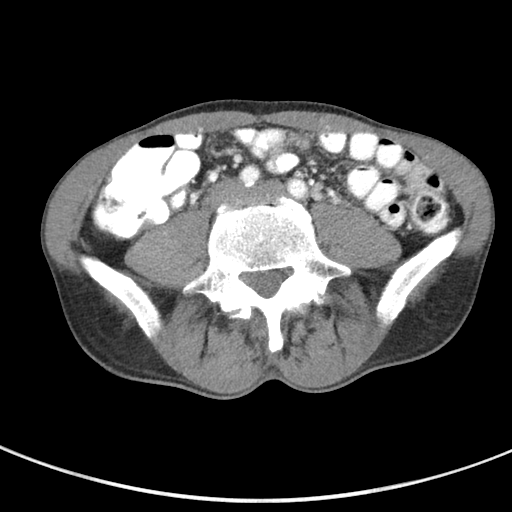
[im 53/96  soft-tissue]
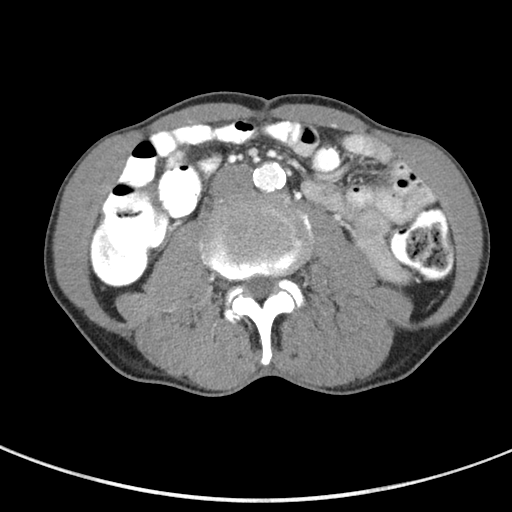
[im 59/96  soft-tissue]
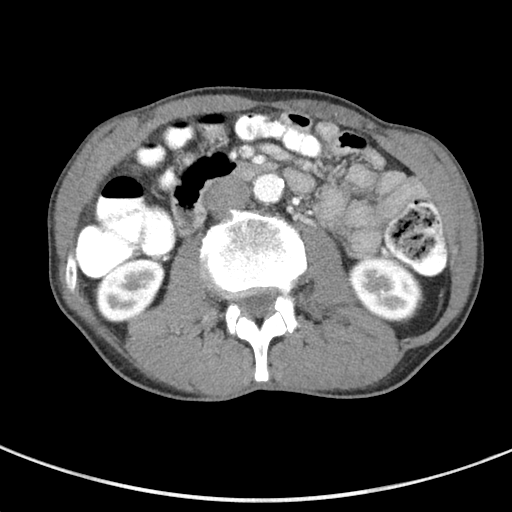
[im 69/96  soft-tissue]
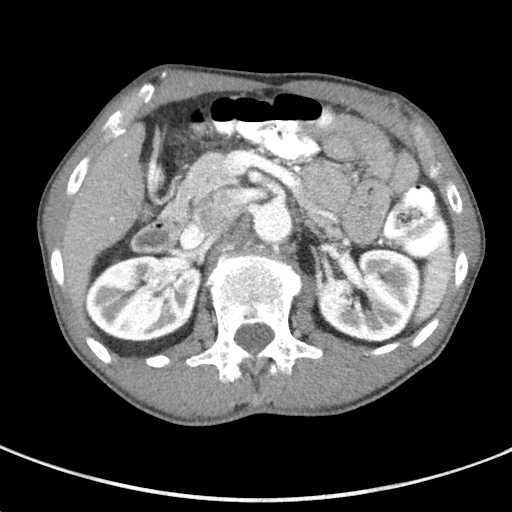
[im 69/96  bone]
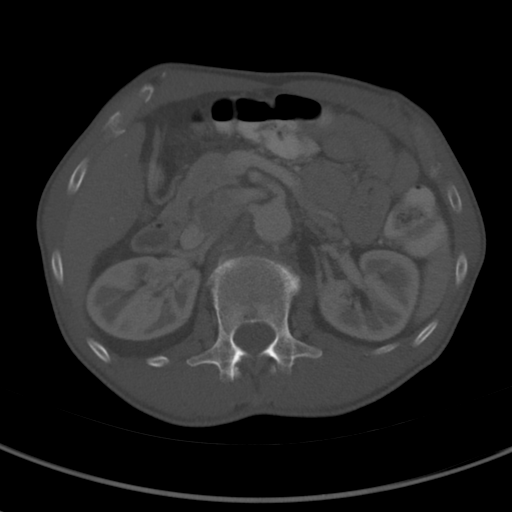
[im 74/96  soft-tissue]
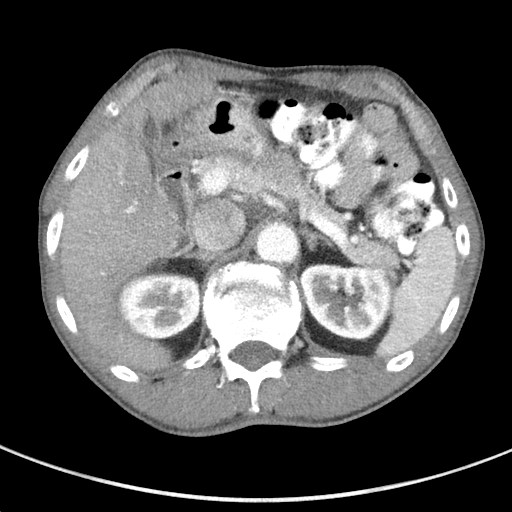
[im 80/96  soft-tissue]
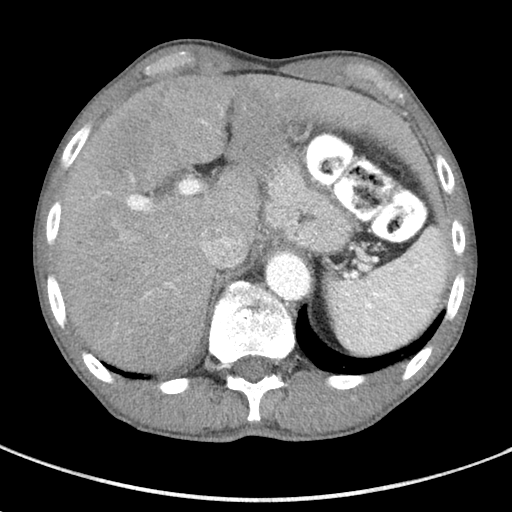
[im 90/96  soft-tissue]
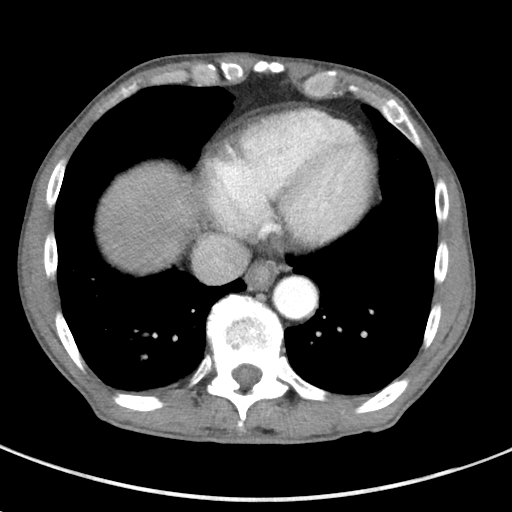

[Series 5: coronal st · coronal · 0.63mm/px · 3 of 76 slices shown]
[im 26/76  soft-tissue]
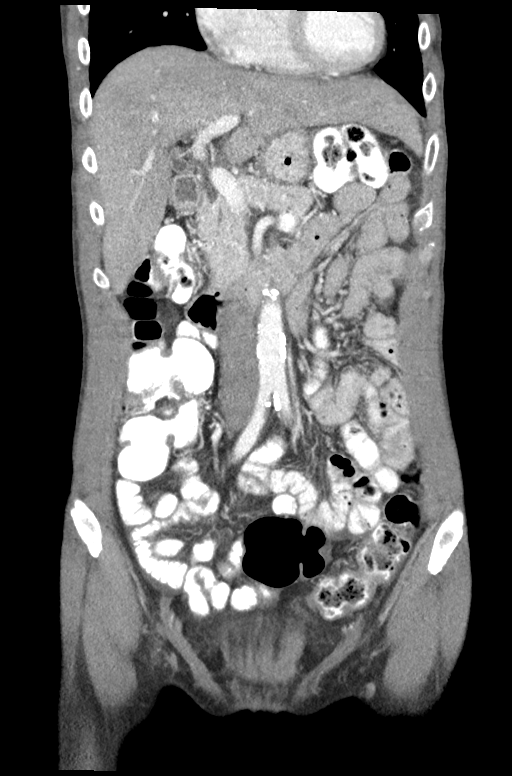
[im 34/76  soft-tissue]
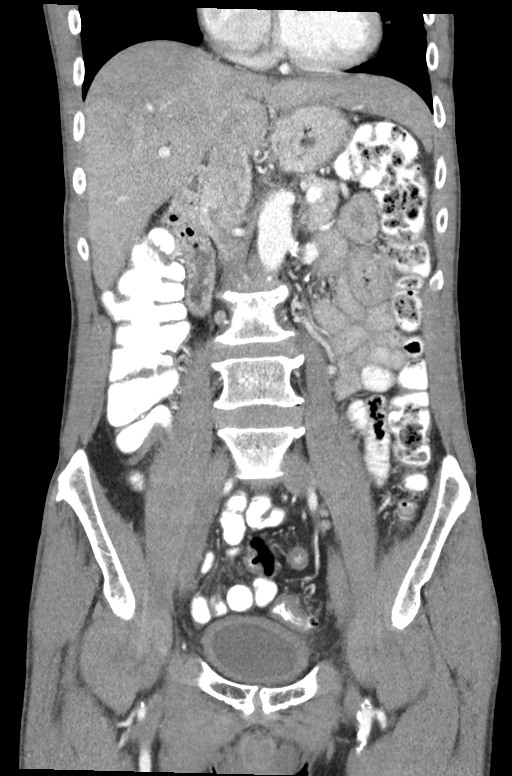
[im 42/76  soft-tissue]
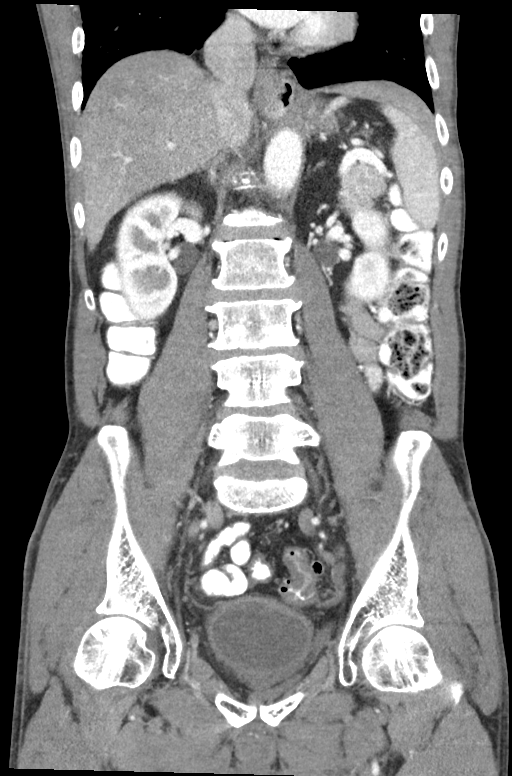

[15 of 46 positions shown; findings below may reference images not displayed]

RADIATION DOSE REDUCTION: This exam was performed according to the
departmental dose-optimization program which includes automated
exposure control, adjustment of the mA and/or kV according to
patient size and/or use of iterative reconstruction technique.

CONTRAST:  100mL OMNIPAQUE IOHEXOL 300 MG/ML SOLN, additional oral
enteric contrast
FINDINGS: Lower chest: No acute abnormality.  Small hiatal hernia.

Hepatobiliary: No solid liver abnormality is seen. Contracted
gallbladder. No gallstones, gallbladder wall thickening, or biliary
dilatation.

Pancreas: Unremarkable. No pancreatic ductal dilatation or
surrounding inflammatory changes.

Spleen: Normal in size without significant abnormality.

Adrenals/Urinary Tract: Adrenal glands are unremarkable. Kidneys are
normal, without renal calculi, solid lesion, or hydronephrosis.
Bladder is unremarkable.

Stomach/Bowel: Stomach is within normal limits. Appendix appears
normal. Sigmoid diverticula. Unchanged circumferential wall
thickening of the low rectum (series 2, image 84). There is i
slightly ncreased adjacent perirectal fat stranding and presacral
soft tissue thickening.

Vascular/Lymphatic: Aortic atherosclerosis. No enlarged abdominal or
pelvic lymph nodes.

Reproductive: Prostatomegaly

Other: No abdominal wall hernia or abnormality. No ascites.

Musculoskeletal: No acute or significant osseous findings.
IMPRESSION: 1. Unchanged circumferential wall thickening of the low rectum,
consistent with primary rectal malignancy. Slightly increased
adjacent perirectal fat stranding and presacral soft tissue
thickening, consistent with developing post treatment change.
2. No evidence of lymphadenopathy or metastatic disease in the
abdomen or pelvis.
3. Prostatomegaly.
4. Small hiatal hernia.

Aortic Atherosclerosis ([UQ]-[UQ]).

## 2021-08-12 MED ORDER — IOHEXOL 300 MG/ML  SOLN
100.0000 mL | Freq: Once | INTRAMUSCULAR | Status: AC | PRN
  Administered 2021-08-12: 100 mL via INTRAVENOUS

## 2021-08-15 ENCOUNTER — Other Ambulatory Visit: Payer: Self-pay | Admitting: Internal Medicine

## 2021-08-15 DIAGNOSIS — R911 Solitary pulmonary nodule: Secondary | ICD-10-CM

## 2021-08-15 DIAGNOSIS — C20 Malignant neoplasm of rectum: Secondary | ICD-10-CM

## 2021-08-17 ENCOUNTER — Inpatient Hospital Stay (HOSPITAL_BASED_OUTPATIENT_CLINIC_OR_DEPARTMENT_OTHER): Payer: Medicaid Other | Admitting: Internal Medicine

## 2021-08-17 ENCOUNTER — Encounter: Payer: Self-pay | Admitting: Internal Medicine

## 2021-08-17 ENCOUNTER — Inpatient Hospital Stay: Payer: Medicaid Other

## 2021-08-17 DIAGNOSIS — C20 Malignant neoplasm of rectum: Secondary | ICD-10-CM

## 2021-08-17 DIAGNOSIS — Z5111 Encounter for antineoplastic chemotherapy: Secondary | ICD-10-CM | POA: Diagnosis not present

## 2021-08-17 LAB — CBC WITH DIFFERENTIAL/PLATELET
Abs Immature Granulocytes: 0.02 10*3/uL (ref 0.00–0.07)
Basophils Absolute: 0 10*3/uL (ref 0.0–0.1)
Basophils Relative: 1 %
Eosinophils Absolute: 0.1 10*3/uL (ref 0.0–0.5)
Eosinophils Relative: 4 %
HCT: 35.2 % — ABNORMAL LOW (ref 39.0–52.0)
Hemoglobin: 12.1 g/dL — ABNORMAL LOW (ref 13.0–17.0)
Immature Granulocytes: 1 %
Lymphocytes Relative: 26 %
Lymphs Abs: 0.9 10*3/uL (ref 0.7–4.0)
MCH: 35.4 pg — ABNORMAL HIGH (ref 26.0–34.0)
MCHC: 34.4 g/dL (ref 30.0–36.0)
MCV: 102.9 fL — ABNORMAL HIGH (ref 80.0–100.0)
Monocytes Absolute: 0.4 10*3/uL (ref 0.1–1.0)
Monocytes Relative: 12 %
Neutro Abs: 2 10*3/uL (ref 1.7–7.7)
Neutrophils Relative %: 56 %
Platelets: 111 10*3/uL — ABNORMAL LOW (ref 150–400)
RBC: 3.42 MIL/uL — ABNORMAL LOW (ref 4.22–5.81)
RDW: 14.9 % (ref 11.5–15.5)
WBC: 3.5 10*3/uL — ABNORMAL LOW (ref 4.0–10.5)
nRBC: 0 % (ref 0.0–0.2)

## 2021-08-17 LAB — COMPREHENSIVE METABOLIC PANEL
ALT: 22 U/L (ref 0–44)
AST: 33 U/L (ref 15–41)
Albumin: 4.1 g/dL (ref 3.5–5.0)
Alkaline Phosphatase: 81 U/L (ref 38–126)
Anion gap: 8 (ref 5–15)
BUN: 9 mg/dL (ref 8–23)
CO2: 28 mmol/L (ref 22–32)
Calcium: 8.9 mg/dL (ref 8.9–10.3)
Chloride: 101 mmol/L (ref 98–111)
Creatinine, Ser: 0.79 mg/dL (ref 0.61–1.24)
GFR, Estimated: >60 mL/min (ref >60)
Glucose, Bld: 100 mg/dL — ABNORMAL HIGH (ref 70–99)
Potassium: 3.5 mmol/L (ref 3.5–5.1)
Sodium: 137 mmol/L (ref 135–145)
Total Bilirubin: 0.7 mg/dL (ref 0.3–1.2)
Total Protein: 6.8 g/dL (ref 6.5–8.1)

## 2021-08-17 NOTE — Assessment & Plan Note (Addendum)
#   OCT 2022-Rectal cancer-  locally advanced disease. MRI- OCT 2022- T4bN2; ? M [see below].  s/p  concurrent chemoradiation with Xeloda. Radiation [finished DEC 12th, 2022].  S/p FOLFOX x 8 cycles [may 2023-1st week]. CT May, 2023-  Unchanged circumferential wall thickening of the low rectum, consistent with primary rectal malignancy. Slightly increased adjacent perirectal fat stranding and presacral soft tissue thickening, consistent with developing post treatment change. No evidence of lymphadenopathy or metastatic disease in the abdomen or pelvis.  Currently awaiting MRI of the rectum.   #Patient s/p evaluation at [facility] for consideration of surgery.  Reviewed the chart from [place].  Await MRI of the rectum; follow-up with [facility].   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   # Diarrhea- G-2;from 5 FU-  Continue  Lomotil as needed- prn. STABLE.   # PET 2022-LEFT lower lobe nodule with increased metabolic activity suspicious for either metastatic colorectal neoplasm or second primary.  Mildly reduced in size, currently 0.8 by 0.6 cm [ formerly 1.1 by 0.9 cm]-monitor for now.   Await CT scan.   # Anxiety:[Hx of addiction to Xanax] poorly controlled; continue the Klonipin to 1 mg BID; declines psychiatrist.   # Iron deficient anemia hemoglobin 12-hemoglobin- STABLE.   Lucianne Lei pt CT chest on 5/30; MRI- on 6/6  # DISPOSITION: # Follow up 3 weeks- MD; labs- cbc/cmp; -Dr.B  # I reviewed the blood work- with the patient in detail; also reviewed the imaging independently [as summarized above]; and with the patient in detail.
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
## 2021-08-17 NOTE — Progress Notes (Signed)
Pt has concerns about having his prostate removed and having a bag the rest of his life, was told this by another doctor.

## 2021-08-22 ENCOUNTER — Encounter: Payer: Self-pay | Admitting: Internal Medicine

## 2021-08-23 ENCOUNTER — Ambulatory Visit
Admission: RE | Admit: 2021-08-23 | Discharge: 2021-08-23 | Disposition: A | Discharge status: Home / Self Care | Payer: Medicaid Other | Source: Ambulatory Visit | Attending: Internal Medicine | Admitting: Internal Medicine

## 2021-08-23 ENCOUNTER — Inpatient Hospital Stay: Payer: Medicaid Other

## 2021-08-23 DIAGNOSIS — C20 Malignant neoplasm of rectum: Secondary | ICD-10-CM | POA: Diagnosis present

## 2021-08-23 DIAGNOSIS — R911 Solitary pulmonary nodule: Secondary | ICD-10-CM | POA: Diagnosis present

## 2021-08-23 IMAGING — CT CT CHEST W/ CM
2 of 4 series · 15 of 36 positions shown, 18 images · IV contrast (agent unspecified)
Comparison: [DATE]

CLINICAL DATA: Follow-up pulmonary nodules.  Rectal carcinoma.

EXAM:
CT CHEST WITH CONTRAST
TECHNIQUE: Multidetector CT imaging of the chest was performed during
intravenous contrast administration.

[Series 2: axial st · axial · 0.61mm/px · z∈[-52,+224]mm · 12 of 164 slices shown, 15 images]
[im 13/164  mediastinal]
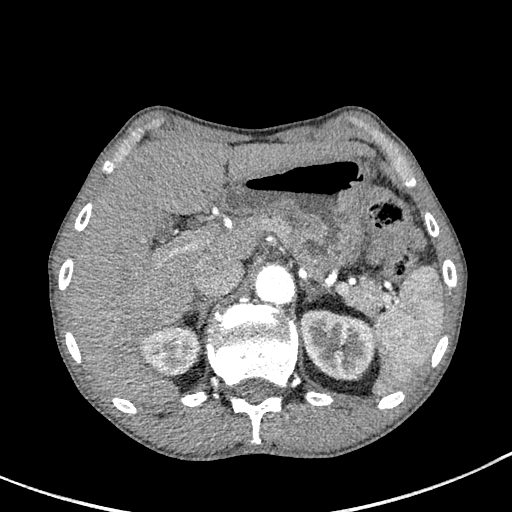
[im 13/164  lung]
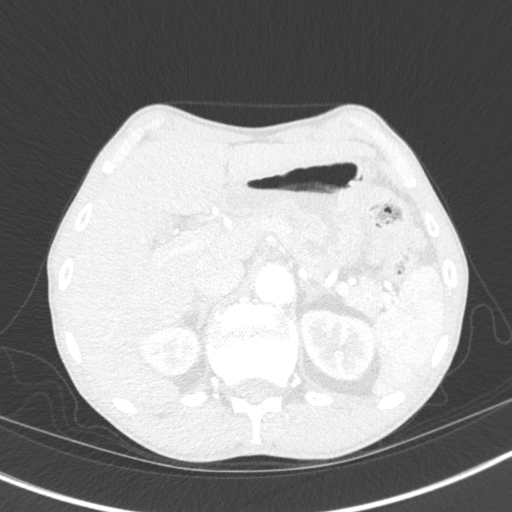
[im 26/164  lung]
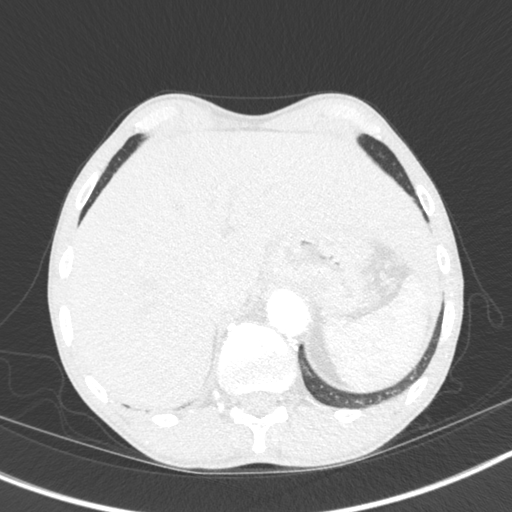
[im 38/164  lung]
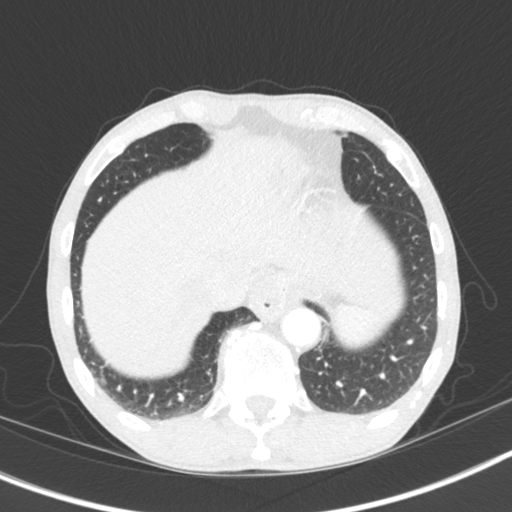
[im 51/164  lung]
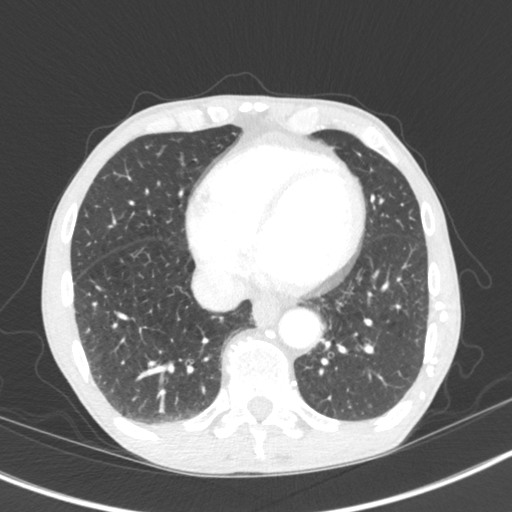
[im 63/164  mediastinal]
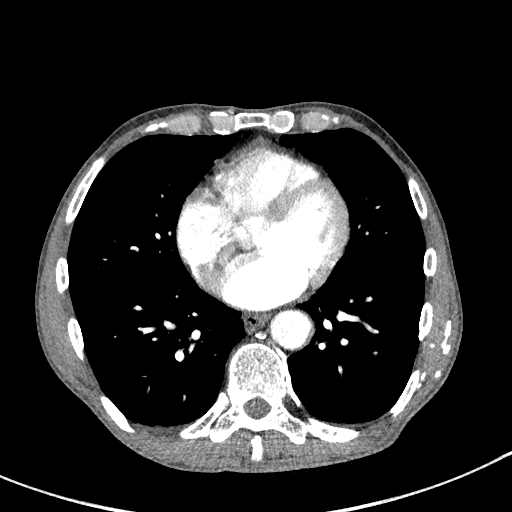
[im 63/164  lung]
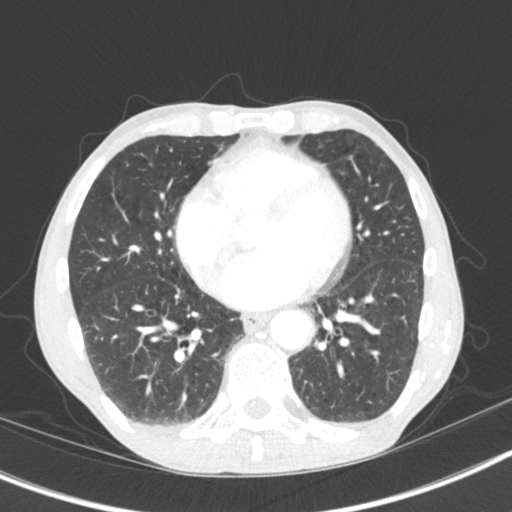
[im 76/164  lung]
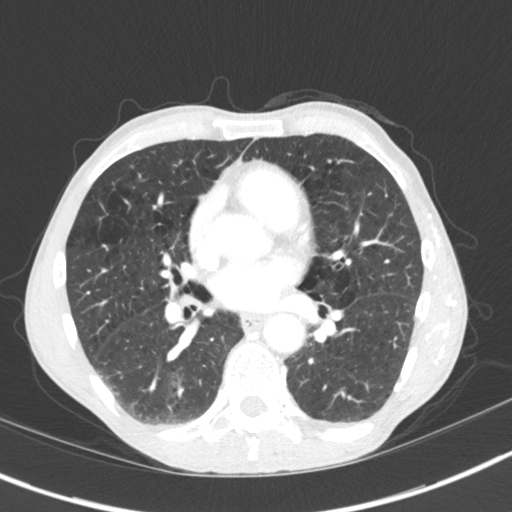
[im 88/164  lung]
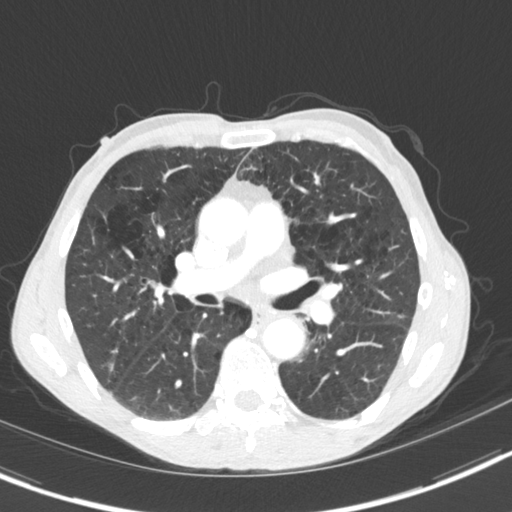
[im 101/164  lung]
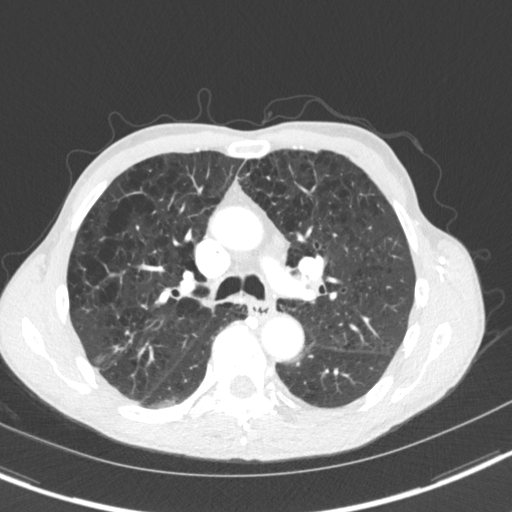
[im 113/164  mediastinal]
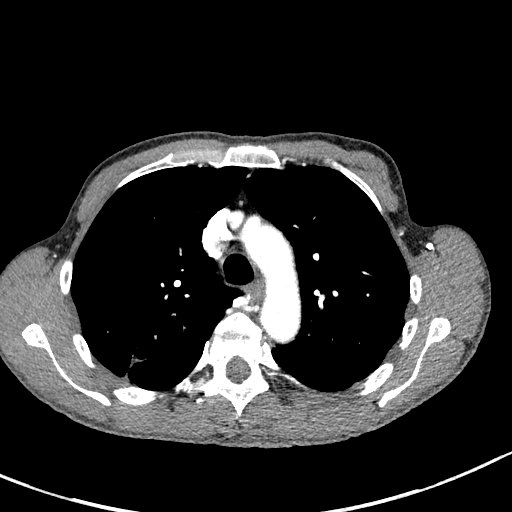
[im 113/164  lung]
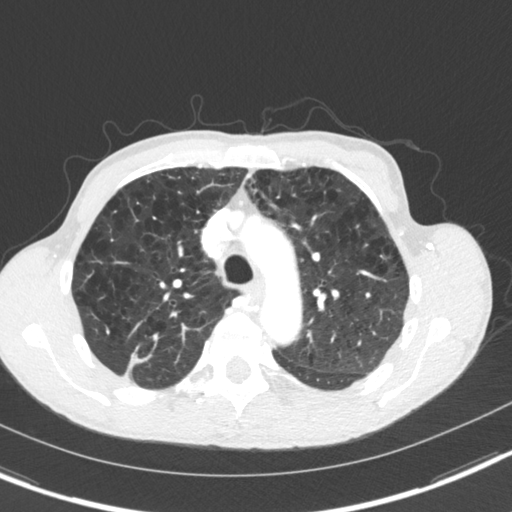
[im 126/164  lung]
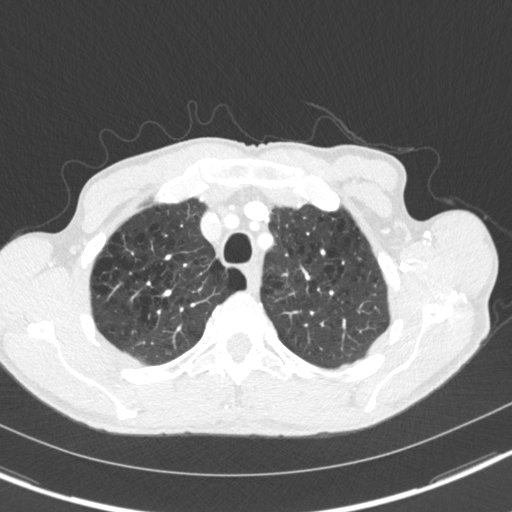
[im 138/164  lung]
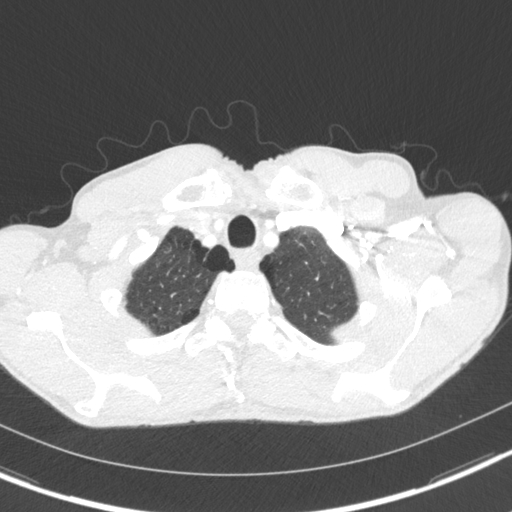
[im 151/164  lung]
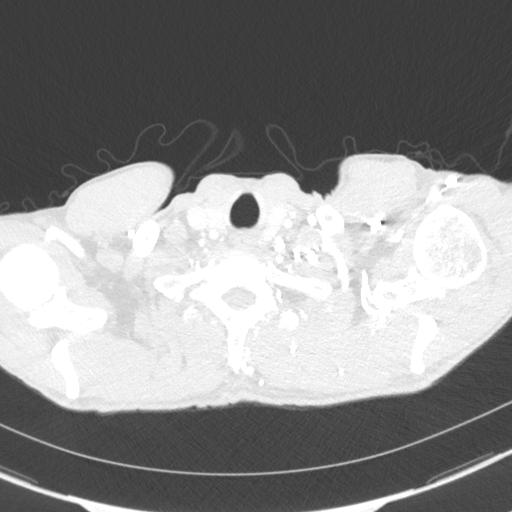

[Series 5: coronal · coronal · 0.56mm/px · 3 of 120 slices shown]
[im 24/120  lung]
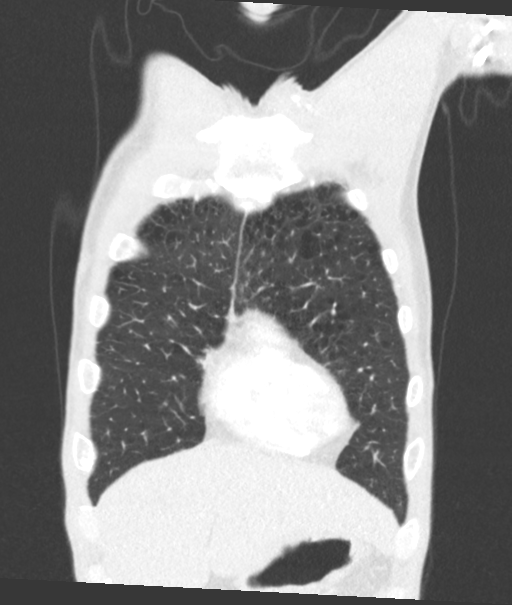
[im 48/120  lung]
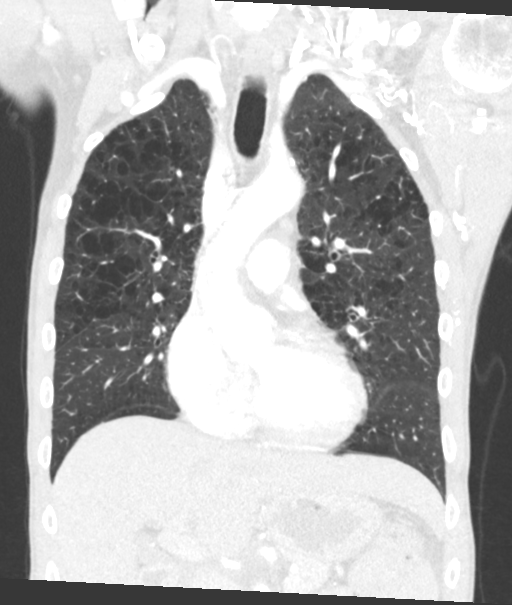
[im 72/120  lung]
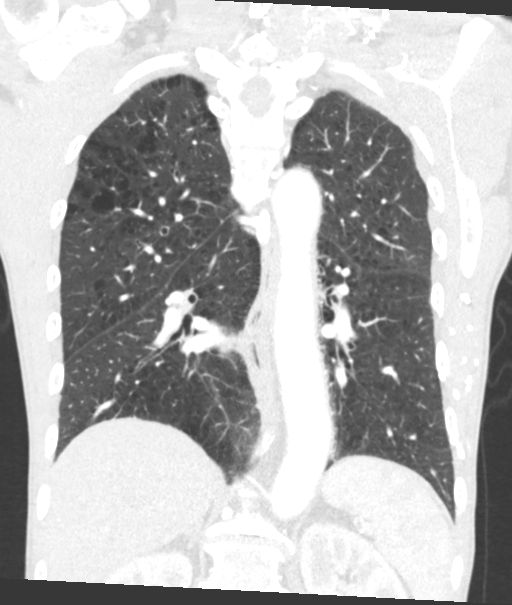

[15 of 36 positions shown; findings below may reference images not displayed]

RADIATION DOSE REDUCTION: This exam was performed according to the
departmental dose-optimization program which includes automated
exposure control, adjustment of the mA and/or kV according to
patient size and/or use of iterative reconstruction technique.

CONTRAST:  75mL OMNIPAQUE IOHEXOL 300 MG/ML  SOLN
FINDINGS: Cardiovascular:  No acute findings.

Mediastinum/Nodes: No masses or pathologically enlarged lymph nodes
identified.

Lungs/Pleura: Moderate centrilobular emphysema again noted.
Previously seen bandlike density in the posteroinferior right upper
lobe has resolved since prior exam. Pulmonary nodule in the
posterior left upper lobe has decreased in size, currently measuring
5 mm compared to 8 mm previously. These findings are consistent with
resolving inflammatory etiology.

Scarring in the posterior right upper lobe is again seen with
associated nodular component measuring 9 x 6 mm on image 54/3. This
remains stable. No new or enlarging pulmonary nodules or masses
identified. No evidence pulmonary consolidation or pleural effusion.

Upper Abdomen:  Stable small hiatal hernia.

Musculoskeletal: No suspicious bone lesions. Old fracture deformity
of left clavicle again noted.
IMPRESSION: Resolution or decreased size of bilateral upper lobe nodular
densities since prior study.

Stable right upper lobe scarring with sub-cm nodular component.
Recommend continued attention on follow-up imaging.

No new or progressive disease.

Stable small hiatal hernia.

Emphysema ([6Q]-[6Q]).

## 2021-08-23 MED ORDER — IOHEXOL 300 MG/ML  SOLN
75.0000 mL | Freq: Once | INTRAMUSCULAR | Status: AC | PRN
Start: 2021-08-23 — End: 2021-08-23
  Administered 2021-08-23: 75 mL via INTRAVENOUS

## 2021-08-27 ENCOUNTER — Other Ambulatory Visit: Payer: Self-pay | Admitting: Internal Medicine

## 2021-08-29 ENCOUNTER — Telehealth: Payer: Self-pay | Admitting: Internal Medicine

## 2021-08-29 ENCOUNTER — Encounter: Payer: Self-pay | Admitting: Internal Medicine

## 2021-08-29 NOTE — Telephone Encounter (Signed)
Done, request was sent by pharmacy Saturday and I forwarded to Dr B this morning

## 2021-08-29 NOTE — Telephone Encounter (Signed)
Pt called and stated he needed his medication. He did not specify which medication and wanted a clinical staff member to give him a call back to discuss.

## 2021-08-30 ENCOUNTER — Inpatient Hospital Stay: Payer: Medicaid Other | Attending: Radiation Oncology

## 2021-08-30 ENCOUNTER — Ambulatory Visit
Admission: RE | Admit: 2021-08-30 | Discharge: 2021-08-30 | Disposition: A | Discharge status: Home / Self Care | Payer: Medicaid Other | Source: Ambulatory Visit | Attending: Internal Medicine | Admitting: Internal Medicine

## 2021-08-30 DIAGNOSIS — C20 Malignant neoplasm of rectum: Secondary | ICD-10-CM | POA: Diagnosis present

## 2021-08-30 DIAGNOSIS — R197 Diarrhea, unspecified: Secondary | ICD-10-CM | POA: Insufficient documentation

## 2021-08-30 IMAGING — MR MR PELVIS W/O CM
8 series · 48 of 48 positions shown · non-contrast
Comparison: None Available.

CLINICAL DATA: Rectal carcinoma follow-up.

EXAM:
MRI PELVIS WITHOUT CONTRAST
TECHNIQUE: Multiplanar multisequence MR imaging of the pelvis was performed. No
intravenous contrast was administered.

[Series 3: T2 · axial · 5.0mm · 1.19mm/px · z∈[-191,+25]mm · 5 of 37 slices shown (1 of 5)]
[im 1/37]
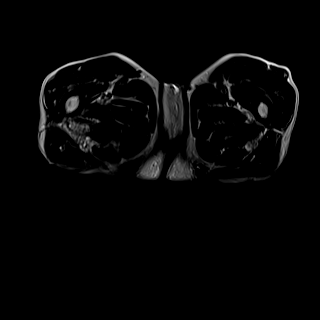
[im 10/37]
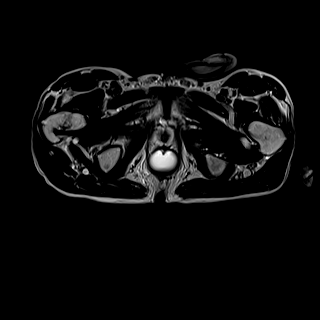
[im 19/37]
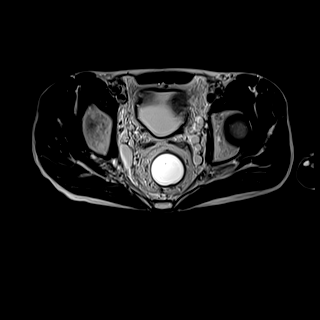
[im 28/37]
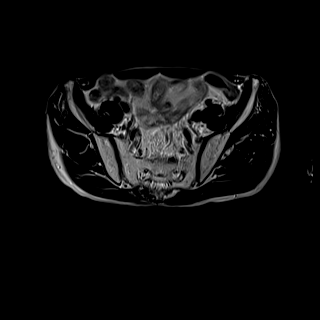
[im 37/37]
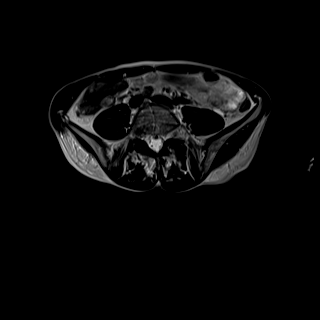

[Series 4: T2 · sagittal · 3.0mm · 0.94mm/px · 5 of 35 slices shown (2 of 5)]
[im 1/35]
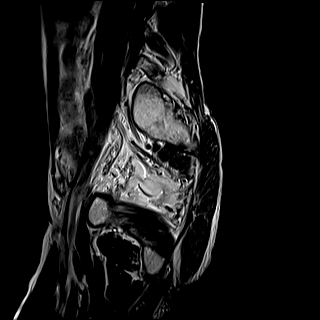
[im 9/35]
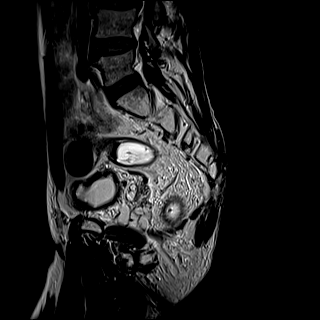
[im 18/35]
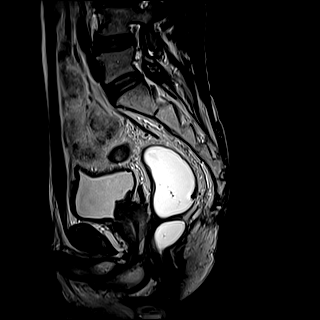
[im 26/35]
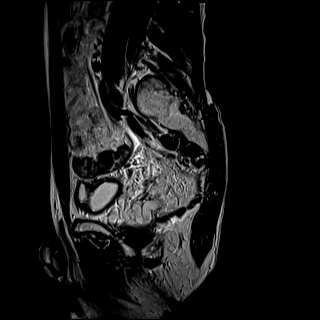
[im 35/35]
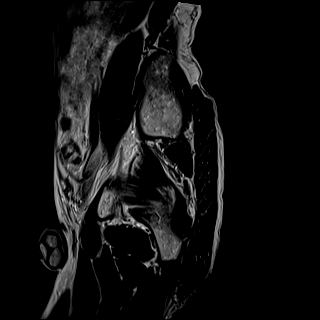

[Series 5: T2 · coronal · 3.0mm · 0.56mm/px · 6 of 45 slices shown (3 of 5)]
[im 1/45]
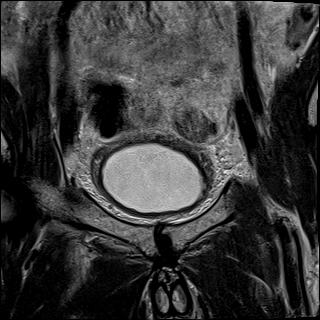
[im 9/45]
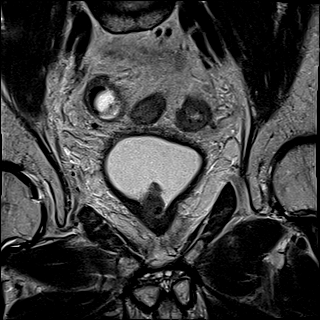
[im 18/45]
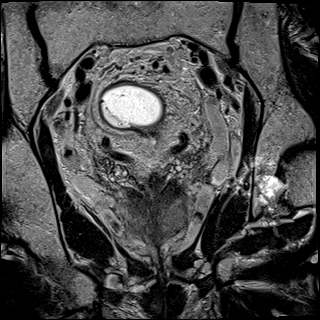
[im 27/45]
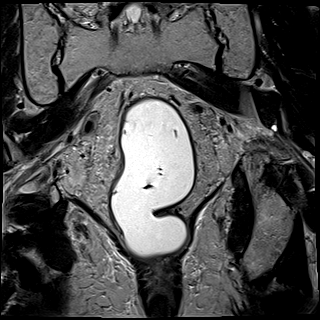
[im 36/45]
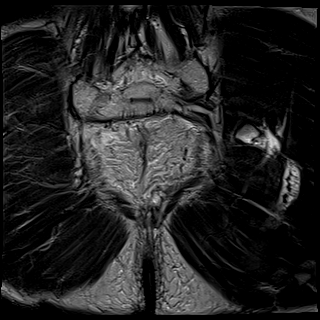
[im 45/45]
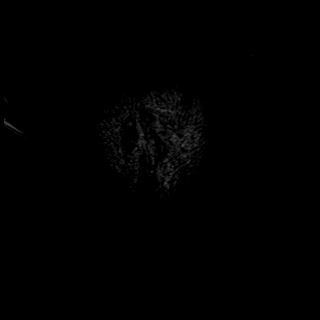

[Series 6: ax dwi_tracew · axial · 5.0mm · 1.17mm/px · z∈[-184,-34]mm · 11 of 78 slices shown]
[im 1/78]
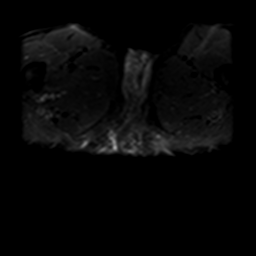
[im 8/78]
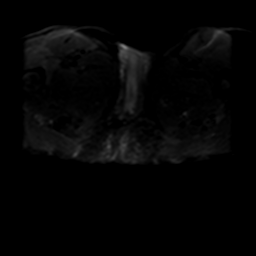
[im 16/78]
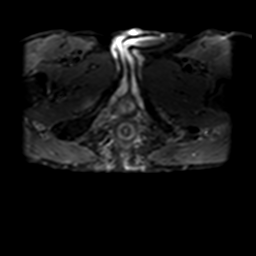
[im 24/78]
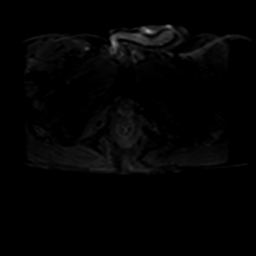
[im 31/78]
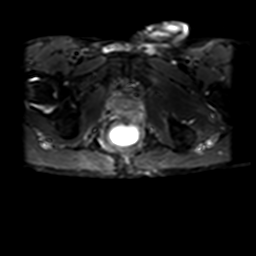
[im 39/78]
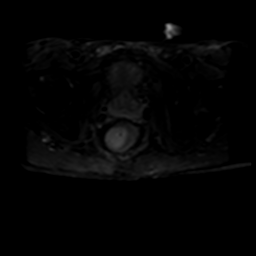
[im 47/78]
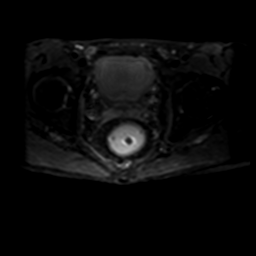
[im 54/78]
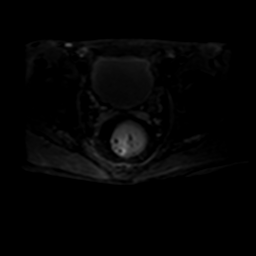
[im 62/78]
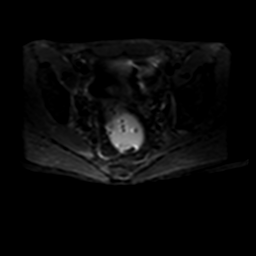
[im 70/78]
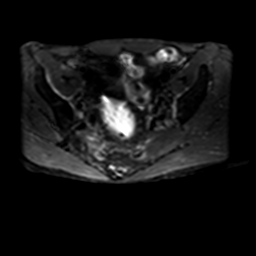
[im 78/78]
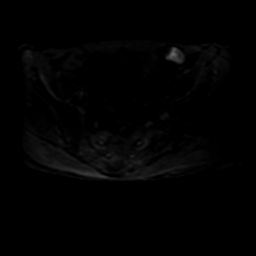

[Series 7: ax dwi_adc · axial · 5.0mm · 1.17mm/px · z∈[-184,-34]mm · 4 of 26 slices shown]
[im 1/26]
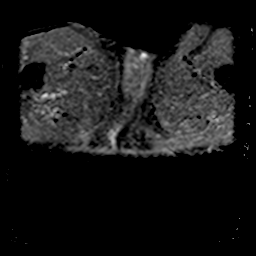
[im 9/26]
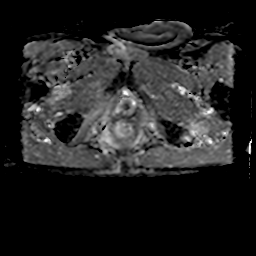
[im 17/26]
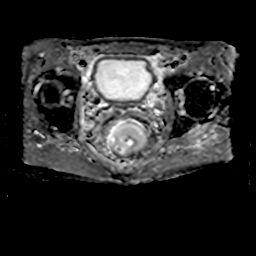
[im 26/26]
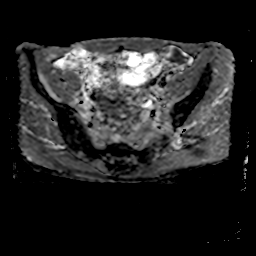

[Series 8: ax dwi_calc_bval · axial · 5.0mm · 1.17mm/px · z∈[-184,-34]mm · 4 of 26 slices shown]
[im 1/26]
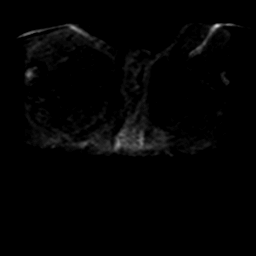
[im 9/26]
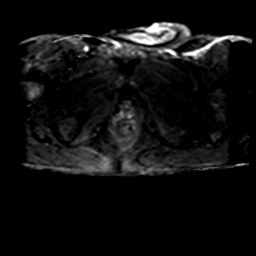
[im 17/26]
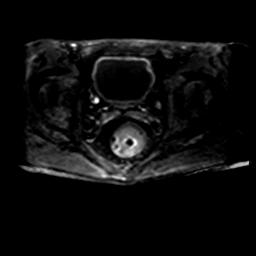
[im 26/26]
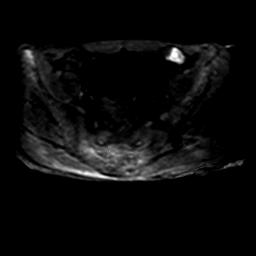

[Series 9: T2 · axial · 3.0mm · 0.56mm/px · z∈[-175,-38]mm · 7 of 47 slices shown (4 of 5)]
[im 1/47]
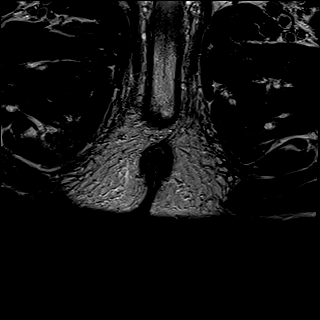
[im 8/47]
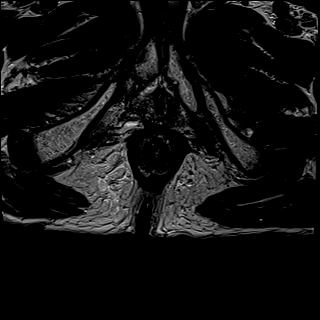
[im 16/47]
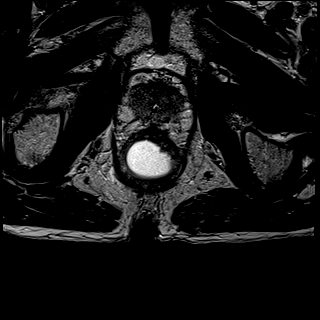
[im 24/47]
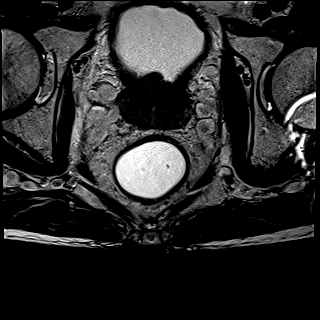
[im 31/47]
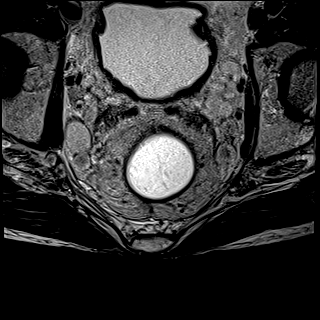
[im 39/47]
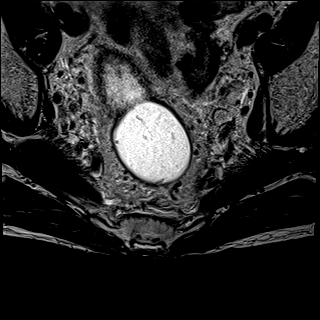
[im 47/47]
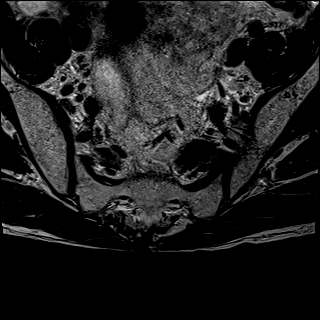

[Series 10: T2 · coronal · 3.0mm · 0.56mm/px · 6 of 45 slices shown (5 of 5)]
[im 1/45]
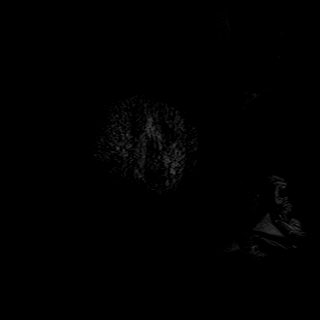
[im 9/45]
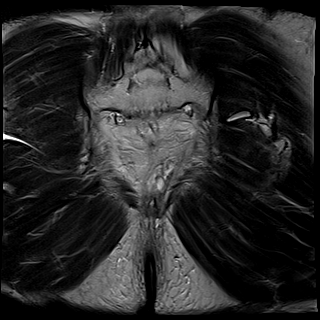
[im 18/45]
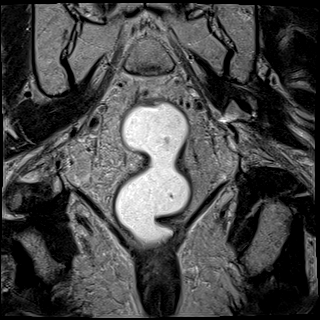
[im 27/45]
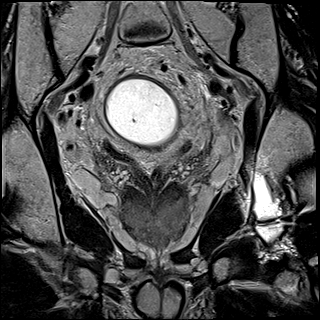
[im 36/45]
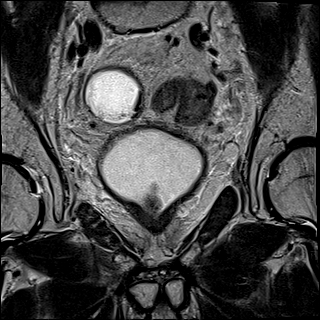
[im 45/45]
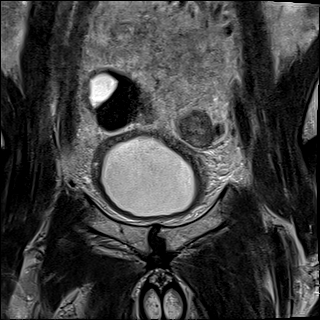

[48 of 48 positions shown; findings below may reference images not displayed]

FINDINGS: The bulk of the endoluminal tumors seen on comparison exam is near
completely resolved. There is still persistent rectal wall
thickening along the anterior margin of the mid rectum posterior
prostate gland. Thickening measures 11 mm (image image [DATE])
compared to 8 mm on prior. This anterior rectal wall thickening
continues to abut the posterior wall the prostate gland (image
[DATE]). No clear fat plane between the anterior wall the rectum and
the prostate.

Previously described mesial rectal lymph nodes no longer identified.
IMPRESSION: 1. While the bulk within endoluminal tumor has near completely
resolved, there is persistent thickened along the anterior wall the
rectum at site of rectal carcinoma. No clear fat plane between the
anterior wall the rectum and the posterior wall of the prostate
gland as described previously.
2. No measurable  lymph nodes in the mesial rectal sheath.

## 2021-09-01 ENCOUNTER — Telehealth: Payer: Self-pay

## 2021-09-01 NOTE — Telephone Encounter (Signed)
Results of MRI powershared to [facility] on 08/31/21. Surgical team notified.

## 2021-09-05 ENCOUNTER — Inpatient Hospital Stay: Payer: Medicaid Other

## 2021-09-05 ENCOUNTER — Inpatient Hospital Stay (HOSPITAL_BASED_OUTPATIENT_CLINIC_OR_DEPARTMENT_OTHER): Payer: Medicaid Other | Admitting: Internal Medicine

## 2021-09-05 ENCOUNTER — Encounter: Payer: Self-pay | Admitting: Psychiatry

## 2021-09-05 ENCOUNTER — Ambulatory Visit (INDEPENDENT_AMBULATORY_CARE_PROVIDER_SITE_OTHER): Payer: Medicaid Other | Admitting: Psychiatry

## 2021-09-05 ENCOUNTER — Ambulatory Visit
Admission: RE | Admit: 2021-09-05 | Discharge: 2021-09-05 | Disposition: A | Discharge status: Home / Self Care | Payer: Medicaid Other | Source: Ambulatory Visit | Attending: Radiation Oncology | Admitting: Radiation Oncology

## 2021-09-05 ENCOUNTER — Ambulatory Visit
Admission: RE | Admit: 2021-09-05 | Discharge: 2021-09-05 | Disposition: A | Discharge status: Home / Self Care | Payer: Medicaid Other | Source: Ambulatory Visit | Attending: Psychiatry | Admitting: Psychiatry

## 2021-09-05 ENCOUNTER — Other Ambulatory Visit: Payer: Self-pay

## 2021-09-05 VITALS — BP 103/65 | HR 52 | Temp 96.4°F | Ht 67.0 in | Wt 121.9 lb

## 2021-09-05 VITALS — BP 103/66 | HR 60 | Temp 97.9°F | Wt 123.8 lb

## 2021-09-05 DIAGNOSIS — F411 Generalized anxiety disorder: Secondary | ICD-10-CM | POA: Insufficient documentation

## 2021-09-05 DIAGNOSIS — C20 Malignant neoplasm of rectum: Secondary | ICD-10-CM | POA: Insufficient documentation

## 2021-09-05 DIAGNOSIS — Z923 Personal history of irradiation: Secondary | ICD-10-CM | POA: Insufficient documentation

## 2021-09-05 DIAGNOSIS — F321 Major depressive disorder, single episode, moderate: Secondary | ICD-10-CM | POA: Insufficient documentation

## 2021-09-05 DIAGNOSIS — Z9221 Personal history of antineoplastic chemotherapy: Secondary | ICD-10-CM | POA: Insufficient documentation

## 2021-09-05 DIAGNOSIS — Z9189 Other specified personal risk factors, not elsewhere classified: Secondary | ICD-10-CM | POA: Insufficient documentation

## 2021-09-05 DIAGNOSIS — Z5181 Encounter for therapeutic drug level monitoring: Secondary | ICD-10-CM | POA: Diagnosis not present

## 2021-09-05 DIAGNOSIS — R531 Weakness: Secondary | ICD-10-CM

## 2021-09-05 DIAGNOSIS — R197 Diarrhea, unspecified: Secondary | ICD-10-CM | POA: Diagnosis present

## 2021-09-05 DIAGNOSIS — E611 Iron deficiency: Secondary | ICD-10-CM

## 2021-09-05 LAB — COMPREHENSIVE METABOLIC PANEL
ALT: 23 U/L (ref 0–44)
AST: 34 U/L (ref 15–41)
Albumin: 4.3 g/dL (ref 3.5–5.0)
Alkaline Phosphatase: 86 U/L (ref 38–126)
Anion gap: 6 (ref 5–15)
BUN: 9 mg/dL (ref 8–23)
CO2: 27 mmol/L (ref 22–32)
Calcium: 8.9 mg/dL (ref 8.9–10.3)
Chloride: 107 mmol/L (ref 98–111)
Creatinine, Ser: 0.65 mg/dL (ref 0.61–1.24)
GFR, Estimated: >60 mL/min (ref >60)
Glucose, Bld: 90 mg/dL (ref 70–99)
Potassium: 4.1 mmol/L (ref 3.5–5.1)
Sodium: 140 mmol/L (ref 135–145)
Total Bilirubin: 0.9 mg/dL (ref 0.3–1.2)
Total Protein: 7.3 g/dL (ref 6.5–8.1)

## 2021-09-05 LAB — CBC WITH DIFFERENTIAL/PLATELET
Abs Immature Granulocytes: 0.02 10*3/uL (ref 0.00–0.07)
Basophils Absolute: 0 10*3/uL (ref 0.0–0.1)
Basophils Relative: 1 %
Eosinophils Absolute: 0.1 10*3/uL (ref 0.0–0.5)
Eosinophils Relative: 3 %
HCT: 37.9 % — ABNORMAL LOW (ref 39.0–52.0)
Hemoglobin: 13.1 g/dL (ref 13.0–17.0)
Immature Granulocytes: 0 %
Lymphocytes Relative: 18 %
Lymphs Abs: 0.8 10*3/uL (ref 0.7–4.0)
MCH: 35.3 pg — ABNORMAL HIGH (ref 26.0–34.0)
MCHC: 34.6 g/dL (ref 30.0–36.0)
MCV: 102.2 fL — ABNORMAL HIGH (ref 80.0–100.0)
Monocytes Absolute: 0.5 10*3/uL (ref 0.1–1.0)
Monocytes Relative: 10 %
Neutro Abs: 3.2 10*3/uL (ref 1.7–7.7)
Neutrophils Relative %: 68 %
Platelets: 137 10*3/uL — ABNORMAL LOW (ref 150–400)
RBC: 3.71 MIL/uL — ABNORMAL LOW (ref 4.22–5.81)
RDW: 13.2 % (ref 11.5–15.5)
WBC: 4.7 10*3/uL (ref 4.0–10.5)
nRBC: 0 % (ref 0.0–0.2)

## 2021-09-05 MED ORDER — MIRTAZAPINE 7.5 MG PO TABS: 7.5000 mg | ORAL_TABLET | Freq: Every day | ORAL | 1 refills | Status: DC

## 2021-09-05 MED ORDER — HEPARIN SOD (PORK) LOCK FLUSH 100 UNIT/ML IV SOLN
500.0000 [IU] | Freq: Once | INTRAVENOUS | Status: AC | PRN
  Administered 2021-09-05: 500 [IU]
  Filled 2021-09-05: qty 5

## 2021-09-05 MED ORDER — SODIUM CHLORIDE 0.9 % IV SOLN
Freq: Once | INTRAVENOUS | Status: AC
  Filled 2021-09-05: qty 250

## 2021-09-05 MED ORDER — SODIUM CHLORIDE 0.9 % IV SOLN
10.0000 mg | Freq: Once | INTRAVENOUS | Status: AC
  Administered 2021-09-05: 10 mg via INTRAVENOUS
  Filled 2021-09-05: qty 10

## 2021-09-05 MED ORDER — DEXAMETHASONE SODIUM PHOSPHATE 10 MG/ML IJ SOLN: 10.0000 mg | Freq: Once | INTRAMUSCULAR | Status: DC

## 2021-09-05 MED ORDER — ONDANSETRON HCL 4 MG/2ML IJ SOLN
8.0000 mg | Freq: Once | INTRAMUSCULAR | Status: AC
  Administered 2021-09-05: 8 mg via INTRAVENOUS
  Filled 2021-09-05: qty 4

## 2021-09-05 MED ORDER — SODIUM CHLORIDE 0.9 % IV SOLN: Freq: Once | INTRAVENOUS | Status: DC

## 2021-09-05 MED ORDER — HYDROXYZINE PAMOATE 25 MG PO CAPS: 25.0000 mg | ORAL_CAPSULE | Freq: Every day | ORAL | 1 refills | Status: DC | PRN

## 2021-09-05 MED ORDER — SODIUM CHLORIDE 0.9% FLUSH
10.0000 mL | Freq: Once | INTRAVENOUS | Status: DC | PRN
  Filled 2021-09-05: qty 10

## 2021-09-05 NOTE — Patient Instructions (Addendum)
Please call for EKG - [facility]  Therapist - [place] - [phone]    Hydroxyzine Capsules or Tablets What is this medication? HYDROXYZINE (hye [place] i zeen) treats the symptoms of allergies and allergic reactions. It may also be used to treat anxiety or cause drowsiness before a procedure. It works by blocking histamine, a substance released by the body during an allergic reaction. It belongs to a group of medications called antihistamines. This medicine may be used for other purposes; ask your health care provider or pharmacist if you have questions. COMMON BRAND NAME(S): ANX, Atarax, Rezine, Vistaril What should I tell my care team before I take this medication? They need to know if you have any of these conditions: Glaucoma Heart disease History of irregular heartbeat Kidney disease Liver disease Lung or breathing disease, like asthma Stomach or intestine problems Thyroid disease Trouble passing urine An unusual or allergic reaction to hydroxyzine, cetirizine, other medications, foods, dyes or preservatives Pregnant or trying to get pregnant Breast-feeding How should I use this medication? Take this medication by mouth with a full glass of water. Follow the directions on the prescription label. You may take this medication with food or on an empty stomach. Take your medication at regular intervals. Do not take your medication more often than directed. Talk to your care team regarding the use of this medication in children. Special care may be needed. While this medication may be prescribed for children as young as 79 years of age for selected conditions, precautions do apply. Patients over 67 years old may have a stronger reaction and need a smaller dose. Overdosage: If you think you have taken too much of this medicine contact a poison control center or emergency room at once. NOTE: This medicine is only for you. Do not share this medicine with others. What if I miss a  dose? If you miss a dose, take it as soon as you can. If it is almost time for your next dose, take only that dose. Do not take double or extra doses. What may interact with this medication? Do not take this medication with any of the following: Cisapride Dronedarone Pimozide Thioridazine This medication may also interact with the following: Alcohol Antihistamines for allergy, cough, and cold Atropine Barbiturate medications for sleep or seizures, like phenobarbital Certain antibiotics like erythromycin or clarithromycin Certain medications for anxiety or sleep Certain medications for bladder problems like oxybutynin, tolterodine Certain medications for depression or psychotic disturbances Certain medications for irregular heart beat Certain medications for Parkinson's disease like benztropine, trihexyphenidyl Certain medications for seizures like phenobarbital, primidone Certain medications for stomach problems like dicyclomine, hyoscyamine Certain medications for travel sickness like scopolamine Ipratropium Narcotic medications for pain Other medications that prolong the QT interval (which can cause an abnormal heart rhythm) like dofetilide This list may not describe all possible interactions. Give your health care provider a list of all the medicines, herbs, non-prescription drugs, or dietary supplements you use. Also tell them if you smoke, drink alcohol, or use illegal drugs. Some items may interact with your medicine. What should I watch for while using this medication? Tell your care team if your symptoms do not improve. You may get drowsy or dizzy. Do not drive, use machinery, or do anything that needs mental alertness until you know how this medication affects you. Do not stand or sit up quickly, especially if you are an older patient. This reduces the risk of dizzy or fainting spells. Alcohol may interfere with the effect  of this medication. Avoid alcoholic drinks. Your mouth  may get dry. Chewing sugarless gum or sucking hard candy, and drinking plenty of water may help. Contact your care team if the problem does not go away or is severe. This medication may cause dry eyes and blurred vision. If you wear contact lenses you may feel some discomfort. Lubricating drops may help. See your eye care specialist if the problem does not go away or is severe. If you are receiving skin tests for allergies, tell your care team you are using this medication. What side effects may I notice from receiving this medication? Side effects that you should report to your care team as soon as possible: Allergic reactions--skin rash, itching, hives, swelling of the face, lips, tongue, or throat Heart rhythm changes--fast or irregular heartbeat, dizziness, feeling faint or lightheaded, chest pain, trouble breathing Side effects that usually do not require medical attention (report to your care team if they continue or are bothersome): Confusion Drowsiness Dry mouth Hallucinations Headache This list may not describe all possible side effects. Call your doctor for medical advice about side effects. You may report side effects to FDA at 1-800-FDA-1088. Where should I keep my medication? Keep out of the reach of children and pets. Store at room temperature between 15 and 30 degrees C (59 and 86 degrees F). Keep container tightly closed. Throw away any unused medication after the expiration date. NOTE: This sheet is a summary. It may not cover all possible information. If you have questions about this medicine, talk to your doctor, pharmacist, or health care provider.  2023 Elsevier/Gold Standard (2020-05-26 00:00:00) Mirtazapine Tablets What is this medication? MIRTAZAPINE (mir TAZ a peen) treats depression. It increases the amount of serotonin and norepinephrine in the brain, hormones that help regulate mood. This medicine may be used for other purposes; ask your health care provider or  pharmacist if you have questions. COMMON BRAND NAME(S): Remeron What should I tell my care team before I take this medication? They need to know if you have any of these conditions: Bipolar disorder Glaucoma Kidney disease Liver disease Suicidal thoughts An unusual or allergic reaction to mirtazapine, other medications, foods, dyes, or preservatives Pregnant or trying to get pregnant Breast-feeding How should I use this medication? Take this medication by mouth with a glass of water. Follow the directions on the prescription label. Take your medication at regular intervals. Do not take your medication more often than directed. Do not stop taking this medication suddenly except upon the advice of your care team. Stopping this medication too quickly may cause serious side effects or your condition may worsen. A special MedGuide will be given to you by the pharmacist with each prescription and refill. Be sure to read this information carefully each time. Talk to your care team about the use of this medication in children. Special care may be needed. Overdosage: If you think you have taken too much of this medicine contact a poison control center or emergency room at once. NOTE: This medicine is only for you. Do not share this medicine with others. What if I miss a dose? If you miss a dose, take it as soon as you can. If it is almost time for your next dose, take only that dose. Do not take double or extra doses. What may interact with this medication? Do not take this medication with any of the following: Linezolid MAOIs like Carbex, Eldepryl, Marplan, Nardil, and Parnate Methylene blue (injected into a vein) This  medication may also interact with the following: Alcohol Antiviral medications for HIV or AIDS Certain medications that treat or prevent blood clots like warfarin Certain medications for depression, anxiety, or psychotic disturbances Certain medications for fungal infections like  ketoconazole and itraconazole Certain medications for migraine headache like almotriptan, eletriptan, frovatriptan, naratriptan, rizatriptan, sumatriptan, zolmitriptan Certain medications for seizures like carbamazepine or phenytoin Certain medications for sleep Cimetidine Erythromycin Fentanyl Lithium Medications for blood pressure Nefazodone Rasagiline Rifampin Supplements like St. John's wort, kava kava, valerian Tramadol Tryptophan This list may not describe all possible interactions. Give your health care provider a list of all the medicines, herbs, non-prescription drugs, or dietary supplements you use. Also tell them if you smoke, drink alcohol, or use illegal drugs. Some items may interact with your medicine. What should I watch for while using this medication? Tell your care team if your symptoms do not get better or if they get worse. Visit your care team for regular checks on your progress. Because it may take several weeks to see the full effects of this medication, it is important to continue your treatment as prescribed by your doctor. Patients and their families should watch out for new or worsening thoughts of suicide or depression. Also watch out for sudden changes in feelings such as feeling anxious, agitated, panicky, irritable, hostile, aggressive, impulsive, severely restless, overly excited and hyperactive, or not being able to sleep. If this happens, especially at the beginning of treatment or after a change in dose, call your care team. You may get drowsy or dizzy. Do not drive, use machinery, or do anything that needs mental alertness until you know how this medication affects you. Do not stand or sit up quickly, especially if you are an older patient. This reduces the risk of dizzy or fainting spells. Alcohol may interfere with the effect of this medication. Avoid alcoholic drinks. This medication may cause dry eyes and blurred vision. If you wear contact lenses you may  feel some discomfort. Lubricating drops may help. See your eye care team if the problem does not go away or is severe. Your mouth may get dry. Chewing sugarless gum or sucking hard candy, and drinking plenty of water may help. Contact your care team if the problem does not go away or is severe. What side effects may I notice from receiving this medication? Side effects that you should report to your care team as soon as possible: Allergic reactions--skin rash, itching, hives, swelling of the face, lips, tongue, or throat Heart rhythm changes--fast or irregular heartbeat, dizziness, feeling faint or lightheaded, chest pain, trouble breathing Infection--fever, chills, cough, or sore throat Irritability, confusion, fast or irregular heartbeat, muscle stiffness, twitching muscles, sweating, high fever, seizure, chills, vomiting, diarrhea, which may be signs of serotonin syndrome Low sodium level--muscle weakness, fatigue, dizziness, headache, confusion Rash, fever, and swollen lymph nodes Redness, blistering, peeling or loosening of the skin, including inside the mouth Seizures Sudden eye pain or change in vision such as blurry vision, seeing halos around lights, vision loss Thoughts of suicide or self-harm, worsening mood, feelings of depression Side effects that usually do not require medical attention (report to your care team if they continue or are bothersome): Constipation Dizziness Drowsiness Dry mouth Increase in appetite Weight gain This list may not describe all possible side effects. Call your doctor for medical advice about side effects. You may report side effects to FDA at 1-800-FDA-1088. Where should I keep my medication? Keep out of the reach of children. Store  at room temperature between 15 and 30 degrees C (59 and 86 degrees F) Protect from light and moisture. Throw away any unused medication after the expiration date. NOTE: This sheet is a summary. It may not cover all  possible information. If you have questions about this medicine, talk to your doctor, pharmacist, or health care provider.  2023 Elsevier/Gold Standard (2020-06-09 00:00:00)

## 2021-09-05 NOTE — Assessment & Plan Note (Addendum)
#   OCT 2022-Rectal cancer-  locally advanced disease. MRI- OCT 2022- T4bN2; ? M [see below].  s/p  concurrent chemoradiation with Xeloda. Radiation [finished DEC 12th, 2022].  S/p FOLFOX x 8 cycles [may 2023-1st week]. CT May, 2023-  Unchanged circumferential wall thickening of the low rectum, consistent with primary rectal malignancy. Slightly increased adjacent perirectal fat stranding and presacral soft tissue thickening, consistent with developing post treatment change. No evidence of lymphadenopathy or metastatic disease in the abdomen or pelvis.   MRI JUNE 2023-Significantly decreased tumor in the low rectum, although with persistent  there is persistent thickened along the anterior wall the rectum at site of rectal carcinoma. No clear fat plane between the anterior wall the rectum and the posterior wall of the prostate;  No measurable  lymph nodes in the mesial rectal sheath  #Patient had multiple questions regarding surgical treatment options.  Will refer to [facility] surgery team.  Nurse navigator to reach out to [facility] team for follow-up/surgical plan.  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   # Diarrhea- G-2;from 5 FU-  Continue  Lomotil as needed- prn.STABLE.  # PET 2022-LEFT lower lobe nodule with increased metabolic activity suspicious for either metastatic colorectal neoplasm or second primary.  Mildly reduced in size, currently 0.8 by 0.6 cm [ formerly 1.1 by 0.9 cm]-monitor for now. MAY 2023- CT scan- Resolution or decreased size of bilateral upper lobe nodular densities since prior study..   # Anxiety:[Hx of addiction to Xanax] poorly controlled; continue the Klonipin to 1 mg BID; appt?  psychiatrist.   # Iron deficient anemia hemoglobin 12-hemoglobin- STABLE.   Lucianne Lei pt  # DISPOSITION: # Follow up 2 weeks- NP; labs- cbc/cmp;posible IVFs over 1 hour #  Follow up  2 monts-MD; labs- cbc/cmp;posible IVFs over 1 hour  -Dr.B
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
## 2021-09-05 NOTE — Progress Notes (Signed)
Radiation Oncology Follow up Note  Name: [name] [name]   Date:   09/05/2021 MRN:  [id number] DOB: [birth year]    This 61 y.o. male presents to the clinic today for 33-monthfollow-up status post concurrent chemoradiation therapy in a neoadjuvant fashion for local advanced low rectal adenocarcinoma.  REFERRING PROVIDER: [facility], [facility]  HPI: Patient is a 61year old male now out 6 months having completed concurrent chemo radiation and neoadjuvant fashion for local advanced low rectal cancer.  [facility]  Recent MRI of his pelvis showing the bulk of endoluminal tumor is nearly completely resolved there is persistent thickening along the anterior wall at the site of previous rectal cancer concerning for residual disease.  [facility] he has not yet finalized the day of that surgery.  His bowel function is fairly good he is having no increased lower urinary tract symptoms.  COMPLICATIONS OF TREATMENT: none  FOLLOW UP COMPLIANCE: keeps appointments   PHYSICAL EXAM:  BP 103/65   Pulse (!) 52   Temp (!) 96.4 F (35.8 C)   Ht '5\' 7"'$  (1.702 m)   Wt 121 lb 14.4 oz (55.3 kg)   BMI 19.09 kg/m  Frail-appearing male in NAD.  Well-developed well-nourished patient in NAD. HEENT reveals PERLA, EOMI, discs not visualized.  Oral cavity is clear. No oral mucosal lesions are identified. Neck is clear without evidence of cervical or supraclavicular adenopathy. Lungs are clear to A&P. Cardiac examination is essentially unremarkable with regular rate and rhythm without murmur rub or thrill. Abdomen is benign with no organomegaly or masses noted. Motor sensory and DTR levels are equal and symmetric in the upper and lower extremities. Cranial nerves II through XII are grossly intact. Proprioception is intact. No peripheral adenopathy or edema is identified. No motor or sensory levels are noted. Crude visual fields are within normal  range.  RADIOLOGY RESULTS: MRI scans reviewed compatible with above-stated findings  PLAN: Present time I [facility] strongly [facility]  Patient is concerned about colostomy and again we have had a strong argument about proceeding with the final steps in his treatment plan which would be surgical resection.  We are contacting [facility] to schedule an appointment for 6 surgery to be scheduled.  They were awaiting the MRI scan which I recently reviewed which is compatible with persistent disease.  Patient and his son both comprehend my recommendations well.  I have asked to see him back in 4 months for follow-up.  Family knows to call with any concerns.  I would like to take this opportunity to thank you for allowing me to participate in the care of your patient..Noreene Filbert MD

## 2021-09-05 NOTE — Progress Notes (Signed)
Patient still struggles with depression.  Is scheduled to see psychiatrist today at 3:00 for initial evaluation.  No appetite and does drink 3-4 boosts a day.
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
## 2021-09-06 ENCOUNTER — Telehealth: Payer: Self-pay | Admitting: Psychiatry

## 2021-09-06 NOTE — Telephone Encounter (Signed)
Contacted patient to discuss EKG.  Patient to follow up with primary care provider regarding his EKG changes.

## 2021-09-07 ENCOUNTER — Inpatient Hospital Stay: Payer: Medicaid Other | Admitting: Internal Medicine

## 2021-09-07 ENCOUNTER — Inpatient Hospital Stay: Payer: Medicaid Other

## 2021-09-08 ENCOUNTER — Telehealth: Payer: Self-pay | Admitting: Psychiatry

## 2021-09-08 DIAGNOSIS — F411 Generalized anxiety disorder: Secondary | ICD-10-CM

## 2021-09-08 DIAGNOSIS — F321 Major depressive disorder, single episode, moderate: Secondary | ICD-10-CM

## 2021-09-08 DIAGNOSIS — Z9189 Other specified personal risk factors, not elsewhere classified: Secondary | ICD-10-CM

## 2021-09-08 NOTE — Telephone Encounter (Signed)
I have placed a referral in the system for cardiology clearance, recent abnormality on EKG. However not sure if they will cover this patient's health insurance or not. Not sure if they will need anything else. Please assist.

## 2021-09-09 ENCOUNTER — Telehealth: Payer: Self-pay

## 2021-09-09 NOTE — Telephone Encounter (Signed)
Advice - Telephone call with patient to follow up on his appt from 09/05/21 as he reported obtaining paperwork that day by mistake that was not his information.  Pt reported this was picked up with his information and given to him upon leaving he did not  review the information but just later noticed it was not all his when he went to see his physician at the [facility].  Patient reported leaving it with them to return. Also, patient stated speaking with Dr. Shea Evans on 09/08/21 to follow up on recent ECG and will be seeing a cardiologist for follow-up.

## 2021-09-19 ENCOUNTER — Inpatient Hospital Stay: Payer: Medicaid Other

## 2021-09-19 ENCOUNTER — Inpatient Hospital Stay (HOSPITAL_BASED_OUTPATIENT_CLINIC_OR_DEPARTMENT_OTHER): Payer: Medicaid Other | Admitting: Medical Oncology

## 2021-09-19 VITALS — BP 116/77 | HR 53 | Temp 96.9°F | Resp 16 | Wt 120.4 lb

## 2021-09-19 DIAGNOSIS — R531 Weakness: Secondary | ICD-10-CM

## 2021-09-19 DIAGNOSIS — C20 Malignant neoplasm of rectum: Secondary | ICD-10-CM | POA: Diagnosis not present

## 2021-09-19 DIAGNOSIS — F419 Anxiety disorder, unspecified: Secondary | ICD-10-CM | POA: Diagnosis not present

## 2021-09-19 DIAGNOSIS — E611 Iron deficiency: Secondary | ICD-10-CM

## 2021-09-19 DIAGNOSIS — F32A Depression, unspecified: Secondary | ICD-10-CM

## 2021-09-19 DIAGNOSIS — Z95828 Presence of other vascular implants and grafts: Secondary | ICD-10-CM

## 2021-09-19 DIAGNOSIS — R197 Diarrhea, unspecified: Secondary | ICD-10-CM | POA: Diagnosis not present

## 2021-09-19 LAB — CBC WITH DIFFERENTIAL/PLATELET
Abs Immature Granulocytes: 0.02 10*3/uL (ref 0.00–0.07)
Basophils Absolute: 0 10*3/uL (ref 0.0–0.1)
Basophils Relative: 1 %
Eosinophils Absolute: 0.2 10*3/uL (ref 0.0–0.5)
Eosinophils Relative: 4 %
HCT: 39.5 % (ref 39.0–52.0)
Hemoglobin: 13.9 g/dL (ref 13.0–17.0)
Immature Granulocytes: 0 %
Lymphocytes Relative: 15 %
Lymphs Abs: 0.9 10*3/uL (ref 0.7–4.0)
MCH: 35.2 pg — ABNORMAL HIGH (ref 26.0–34.0)
MCHC: 35.2 g/dL (ref 30.0–36.0)
MCV: 100 fL (ref 80.0–100.0)
Monocytes Absolute: 0.6 10*3/uL (ref 0.1–1.0)
Monocytes Relative: 10 %
Neutro Abs: 4.1 10*3/uL (ref 1.7–7.7)
Neutrophils Relative %: 70 %
Platelets: 163 10*3/uL (ref 150–400)
RBC: 3.95 MIL/uL — ABNORMAL LOW (ref 4.22–5.81)
RDW: 12.6 % (ref 11.5–15.5)
WBC: 5.8 10*3/uL (ref 4.0–10.5)
nRBC: 0 % (ref 0.0–0.2)

## 2021-09-19 LAB — COMPREHENSIVE METABOLIC PANEL
ALT: 32 U/L (ref 0–44)
AST: 42 U/L — ABNORMAL HIGH (ref 15–41)
Albumin: 4.3 g/dL (ref 3.5–5.0)
Alkaline Phosphatase: 86 U/L (ref 38–126)
Anion gap: 7 (ref 5–15)
BUN: 13 mg/dL (ref 8–23)
CO2: 26 mmol/L (ref 22–32)
Calcium: 9.2 mg/dL (ref 8.9–10.3)
Chloride: 105 mmol/L (ref 98–111)
Creatinine, Ser: 0.89 mg/dL (ref 0.61–1.24)
GFR, Estimated: >60 mL/min (ref >60)
Glucose, Bld: 110 mg/dL — ABNORMAL HIGH (ref 70–99)
Potassium: 4.2 mmol/L (ref 3.5–5.1)
Sodium: 138 mmol/L (ref 135–145)
Total Bilirubin: 0.6 mg/dL (ref 0.3–1.2)
Total Protein: 7.2 g/dL (ref 6.5–8.1)

## 2021-09-19 MED ORDER — DEXAMETHASONE SODIUM PHOSPHATE 10 MG/ML IJ SOLN
10.0000 mg | Freq: Once | INTRAMUSCULAR | Status: AC
  Administered 2021-09-19: 10 mg via INTRAVENOUS
  Filled 2021-09-19: qty 1

## 2021-09-19 MED ORDER — HEPARIN SOD (PORK) LOCK FLUSH 100 UNIT/ML IV SOLN
500.0000 [IU] | Freq: Once | INTRAVENOUS | Status: AC
  Administered 2021-09-19: 500 [IU] via INTRAVENOUS
  Filled 2021-09-19: qty 5

## 2021-09-19 MED ORDER — SODIUM CHLORIDE 0.9 % IV SOLN
Freq: Once | INTRAVENOUS | Status: AC
  Filled 2021-09-19: qty 250

## 2021-09-19 NOTE — Progress Notes (Signed)
Pt returns for follow-up. He is tearful this morning due to the fact that he was told at [facility] on Friday that he will need both a colostomy and a urostomy.

## 2021-09-20 ENCOUNTER — Other Ambulatory Visit: Payer: Self-pay | Admitting: Radiation Oncology

## 2021-09-25 ENCOUNTER — Other Ambulatory Visit: Payer: Self-pay | Admitting: Internal Medicine

## 2021-09-26 ENCOUNTER — Encounter: Payer: Self-pay | Admitting: Internal Medicine

## 2021-09-28 ENCOUNTER — Other Ambulatory Visit: Payer: Self-pay

## 2021-09-28 MED ORDER — CLONAZEPAM 1 MG PO TABS: 1.0000 mg | ORAL_TABLET | Freq: Two times a day (BID) | ORAL | 0 refills | Status: DC | PRN

## 2021-09-28 NOTE — Telephone Encounter (Signed)
Request sent to Dr. Jacinto Reap.

## 2021-09-28 NOTE — Telephone Encounter (Signed)
Patient called stating that he needs a refill on the klonopin. He states that he is starting to "feel real depressed" about his upcoming surgery. [facility] [address]

## 2021-10-03 ENCOUNTER — Telehealth: Payer: Self-pay | Admitting: Internal Medicine

## 2021-10-03 ENCOUNTER — Ambulatory Visit: Payer: Medicaid Other | Admitting: Psychiatry

## 2021-10-03 NOTE — Telephone Encounter (Signed)
Received notification that the patient was admitted to [facility] this morning.

## 2021-10-04 ENCOUNTER — Encounter: Payer: Self-pay | Admitting: Internal Medicine

## 2021-10-17 ENCOUNTER — Other Ambulatory Visit: Payer: Self-pay

## 2021-10-20 ENCOUNTER — Encounter: Payer: Self-pay | Admitting: Internal Medicine

## 2021-10-20 ENCOUNTER — Encounter: Payer: Self-pay | Admitting: *Deleted

## 2021-10-27 ENCOUNTER — Ambulatory Visit: Payer: Medicaid Other | Admitting: Cardiology

## 2021-10-28 ENCOUNTER — Encounter: Payer: Self-pay | Admitting: Cardiology

## 2021-11-07 ENCOUNTER — Inpatient Hospital Stay: Payer: Medicaid Other

## 2021-11-07 ENCOUNTER — Inpatient Hospital Stay (HOSPITAL_BASED_OUTPATIENT_CLINIC_OR_DEPARTMENT_OTHER): Payer: Medicaid Other | Admitting: Internal Medicine

## 2021-11-07 ENCOUNTER — Telehealth: Payer: Self-pay

## 2021-11-07 ENCOUNTER — Encounter: Payer: Self-pay | Admitting: Internal Medicine

## 2021-11-07 ENCOUNTER — Other Ambulatory Visit: Payer: Self-pay

## 2021-11-07 ENCOUNTER — Inpatient Hospital Stay: Payer: Medicaid Other | Attending: Radiation Oncology

## 2021-11-07 DIAGNOSIS — Z79899 Other long term (current) drug therapy: Secondary | ICD-10-CM | POA: Insufficient documentation

## 2021-11-07 DIAGNOSIS — C20 Malignant neoplasm of rectum: Secondary | ICD-10-CM

## 2021-11-07 DIAGNOSIS — R300 Dysuria: Secondary | ICD-10-CM | POA: Insufficient documentation

## 2021-11-07 DIAGNOSIS — C7800 Secondary malignant neoplasm of unspecified lung: Secondary | ICD-10-CM | POA: Diagnosis present

## 2021-11-07 DIAGNOSIS — R634 Abnormal weight loss: Secondary | ICD-10-CM | POA: Diagnosis not present

## 2021-11-07 DIAGNOSIS — J439 Emphysema, unspecified: Secondary | ICD-10-CM | POA: Insufficient documentation

## 2021-11-07 DIAGNOSIS — Z87891 Personal history of nicotine dependence: Secondary | ICD-10-CM | POA: Diagnosis not present

## 2021-11-07 DIAGNOSIS — Z923 Personal history of irradiation: Secondary | ICD-10-CM | POA: Diagnosis not present

## 2021-11-07 DIAGNOSIS — R197 Diarrhea, unspecified: Secondary | ICD-10-CM | POA: Diagnosis not present

## 2021-11-07 DIAGNOSIS — G629 Polyneuropathy, unspecified: Secondary | ICD-10-CM | POA: Insufficient documentation

## 2021-11-07 DIAGNOSIS — F419 Anxiety disorder, unspecified: Secondary | ICD-10-CM | POA: Diagnosis not present

## 2021-11-07 DIAGNOSIS — Z9221 Personal history of antineoplastic chemotherapy: Secondary | ICD-10-CM | POA: Diagnosis not present

## 2021-11-07 DIAGNOSIS — E611 Iron deficiency: Secondary | ICD-10-CM

## 2021-11-07 DIAGNOSIS — R109 Unspecified abdominal pain: Secondary | ICD-10-CM | POA: Insufficient documentation

## 2021-11-07 LAB — COMPREHENSIVE METABOLIC PANEL
ALT: 25 U/L (ref 0–44)
AST: 29 U/L (ref 15–41)
Albumin: 4.3 g/dL (ref 3.5–5.0)
Alkaline Phosphatase: 91 U/L (ref 38–126)
Anion gap: 9 (ref 5–15)
BUN: 16 mg/dL (ref 8–23)
CO2: 24 mmol/L (ref 22–32)
Calcium: 9.5 mg/dL (ref 8.9–10.3)
Chloride: 100 mmol/L (ref 98–111)
Creatinine, Ser: 0.81 mg/dL (ref 0.61–1.24)
GFR, Estimated: >60 mL/min (ref >60)
Glucose, Bld: 106 mg/dL — ABNORMAL HIGH (ref 70–99)
Potassium: 4.2 mmol/L (ref 3.5–5.1)
Sodium: 133 mmol/L — ABNORMAL LOW (ref 135–145)
Total Bilirubin: 0.3 mg/dL (ref 0.3–1.2)
Total Protein: 7.8 g/dL (ref 6.5–8.1)

## 2021-11-07 LAB — CBC WITH DIFFERENTIAL/PLATELET
Abs Immature Granulocytes: 0.03 10*3/uL (ref 0.00–0.07)
Basophils Absolute: 0.1 10*3/uL (ref 0.0–0.1)
Basophils Relative: 1 %
Eosinophils Absolute: 0.2 10*3/uL (ref 0.0–0.5)
Eosinophils Relative: 3 %
HCT: 36.1 % — ABNORMAL LOW (ref 39.0–52.0)
Hemoglobin: 12.1 g/dL — ABNORMAL LOW (ref 13.0–17.0)
Immature Granulocytes: 0 %
Lymphocytes Relative: 13 %
Lymphs Abs: 1 10*3/uL (ref 0.7–4.0)
MCH: 32.1 pg (ref 26.0–34.0)
MCHC: 33.5 g/dL (ref 30.0–36.0)
MCV: 95.8 fL (ref 80.0–100.0)
Monocytes Absolute: 0.6 10*3/uL (ref 0.1–1.0)
Monocytes Relative: 7 %
Neutro Abs: 5.9 10*3/uL (ref 1.7–7.7)
Neutrophils Relative %: 76 %
Platelets: 189 10*3/uL (ref 150–400)
RBC: 3.77 MIL/uL — ABNORMAL LOW (ref 4.22–5.81)
RDW: 13.4 % (ref 11.5–15.5)
WBC: 7.8 10*3/uL (ref 4.0–10.5)
nRBC: 0 % (ref 0.0–0.2)

## 2021-11-07 LAB — URINALYSIS, COMPLETE (UACMP) WITH MICROSCOPIC
Bacteria, UA: NONE SEEN
Bilirubin Urine: NEGATIVE
Glucose, UA: NEGATIVE mg/dL
Hgb urine dipstick: NEGATIVE
Ketones, ur: NEGATIVE mg/dL
Leukocytes,Ua: NEGATIVE
Nitrite: NEGATIVE
Protein, ur: NEGATIVE mg/dL
Specific Gravity, Urine: 1.018 (ref 1.005–1.030)
Squamous Epithelial / HPF: NONE SEEN (ref 0–5)
pH: 5 (ref 5.0–8.0)

## 2021-11-07 MED ORDER — HYDROCODONE-ACETAMINOPHEN 5-325 MG PO TABS: 1.0000 | ORAL_TABLET | Freq: Every day | ORAL | 0 refills | Status: DC | PRN

## 2021-11-07 MED ORDER — SODIUM CHLORIDE 0.9% FLUSH
10.0000 mL | Freq: Once | INTRAVENOUS | Status: AC | PRN
  Administered 2021-11-07: 10 mL
  Filled 2021-11-07: qty 10

## 2021-11-07 MED ORDER — SODIUM CHLORIDE 0.9 % IV SOLN
Freq: Once | INTRAVENOUS | Status: AC
  Filled 2021-11-07: qty 250

## 2021-11-07 MED ORDER — HEPARIN SOD (PORK) LOCK FLUSH 100 UNIT/ML IV SOLN
500.0000 [IU] | Freq: Once | INTRAVENOUS | Status: AC | PRN
  Administered 2021-11-07: 500 [IU]
  Filled 2021-11-07: qty 5

## 2021-11-07 NOTE — Progress Notes (Signed)
Pt recently diagnosed from [place], reports having surgery and has a new ostomy.  Pt reports taking tylenol without relief for pain in abdomen.  Pt weight down 11 lbs since last documented in flow sheet in June.
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
## 2021-11-07 NOTE — Telephone Encounter (Signed)
Submitted prior auth through cover my meds.

## 2021-11-07 NOTE — Progress Notes (Signed)
Pt received 1 L NS over 1 hour. Pt requesting the steroid. Explained that Dr B does not want him to get a steroid today due to his recent surgery. New colostomy in place. Pt doing well with his own ostomy care. Drinking coffee to help him to provide a urine sample today. Obtained urine specimen. Pt discharged to home

## 2021-11-07 NOTE — Telephone Encounter (Signed)
-----   Message from Secundino Ginger sent at 11/07/2021  1:14 PM EDT ----- Regarding: [place] FPR HYDROCODONE SENT TO CHART.

## 2021-11-07 NOTE — Assessment & Plan Note (Addendum)
#   OCT 2022-Rectal cancer-  locally advanced disease. MRI- OCT 2022- T4bN2; ? M [see below].  s/p  concurrent chemoradiation with Xeloda. Radiation [finished DEC 12th, 2022].  S/p FOLFOX x 8 cycles [may 2023-1st week]. CT May, 2023-  Unchanged circumferential wall thickening of the low rectum, consistent with primary rectal malignancy. Slightly increased adjacent perirectal fat stranding and presacral soft tissue thickening, consistent with developing post treatment change. No evidence of lymphadenopathy or metastatic disease in the abdomen or pelvis.   MRI JUNE 2023-Significantly decreased tumor in the low rectum, although with persistent  there is persistent thickened along the anterior wall the rectum at site of rectal carcinoma. No clear fat plane between the anterior wall the rectum and the posterior wall of the prostate;  No measurable  lymph nodes in the mesial rectal sheath.  # S/p Low anterior resection with hand-sewn coloanal anastomosis, DLI on 10/03/21 by Dr. Maxie Better; ypT2ypN0.  I reviewed the pathology with the patient.   # pain from surgery [July 10th, 2023]- declines tramadol. Will call in Hydrocodone 1 daily prn.   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  # Diarrhea/Ostomy G-2;-  Continue  Lomotil as needed- prn.STABLE.  # PET 2022-LEFT lower lobe nodule with increased metabolic activity suspicious for either metastatic colorectal neoplasm or second primary.  Mildly reduced in size, currently 0.8 by 0.6 cm [ formerly 1.1 by 0.9 cm]-monitor for now. MAY 30th 2023- CT scan- Resolution or decreased size of bilateral upper lobe nodular densities since prior study. Will repeat imaging in SEP-oct 2023.   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  psychiatrist.   # IV Access: port- functional.   Lucianne Lei pt  # DISPOSITION: # check UA/culture today # IVFs over 1 hour #  Follow up  1 monts-MD; labs/port - cbc/cmp; possible IVFs over 1 hour   -Dr.B

## 2021-11-08 ENCOUNTER — Telehealth: Payer: Self-pay | Admitting: *Deleted

## 2021-11-08 LAB — URINE CULTURE: Culture: NO GROWTH

## 2021-11-08 NOTE — Telephone Encounter (Signed)
Patient contacted by myself and advised of doctor recommendation to go to ER, He stated that he does not have a way to get there. I suggested he call an ambulance and he asked if he could just take his Klonopin and I asked if he didn't have a friend or neighbor that could take him to ER because that is what Dr B wants him to do. He said that he may have someone in a little while that can take him

## 2021-11-08 NOTE — Telephone Encounter (Signed)
Patient called stating that he has been having twitching all over his body every 30 mins lasting for 1 minute since last night. He is not sure if he is having mini seizures or what. He is not losing consciousness. He is asking what to do. He is also wondering if it is because he stopped taking his medications we put him on. Please advise

## 2021-11-09 NOTE — Telephone Encounter (Signed)
Additional information submitted via Cover My Meds (Key: [id number])

## 2021-11-09 NOTE — Telephone Encounter (Signed)
PA approved Status: Approved, Coverage Starts on: 11/09/2021 12:00:00 AM, Coverage Ends on: 05/08/2022 12:00:00 AM.  Pharmacy notified.

## 2021-11-10 ENCOUNTER — Other Ambulatory Visit: Payer: Self-pay | Admitting: *Deleted

## 2021-11-10 DIAGNOSIS — F411 Generalized anxiety disorder: Secondary | ICD-10-CM

## 2021-11-10 NOTE — Telephone Encounter (Signed)
Called patient in follow-up. He did not go to the ER as previously instructed to do so yesterday. Pt stated that he 'felt better and his symptoms resolved." He voiced complaints that he is out of klonopin and doesn't have any one to manage his anxiety. He stated that all the doctors' in the past want to put him on depression meds. He doesn't want to be on depression meds. He doesn't know who is psychiatrist is. I reviewed chart. There is a phone note in system by Dr. Shea Evans. I explained to him that Dr. Jacinto Reap wrote in his last note for pt to f/u with psychiatry to manage his anxiety. Referral entered by Dr. Shea Evans.

## 2021-11-24 ENCOUNTER — Encounter: Payer: Self-pay | Admitting: *Deleted

## 2021-12-01 ENCOUNTER — Emergency Department
Admission: EM | Admit: 2021-12-01 | Discharge: 2021-12-01 | Disposition: A | Discharge status: Home / Self Care | Payer: Medicaid Other | Attending: Emergency Medicine | Admitting: Emergency Medicine

## 2021-12-01 ENCOUNTER — Other Ambulatory Visit: Payer: Self-pay

## 2021-12-01 ENCOUNTER — Encounter: Payer: Self-pay | Admitting: Emergency Medicine

## 2021-12-01 ENCOUNTER — Emergency Department: Payer: Medicaid Other

## 2021-12-01 DIAGNOSIS — Z939 Artificial opening status, unspecified: Secondary | ICD-10-CM

## 2021-12-01 DIAGNOSIS — Z933 Colostomy status: Secondary | ICD-10-CM | POA: Diagnosis not present

## 2021-12-01 DIAGNOSIS — C20 Malignant neoplasm of rectum: Secondary | ICD-10-CM | POA: Diagnosis not present

## 2021-12-01 DIAGNOSIS — K5900 Constipation, unspecified: Secondary | ICD-10-CM | POA: Diagnosis not present

## 2021-12-01 DIAGNOSIS — R1030 Lower abdominal pain, unspecified: Secondary | ICD-10-CM | POA: Diagnosis present

## 2021-12-01 LAB — URINALYSIS, ROUTINE W REFLEX MICROSCOPIC
Bilirubin Urine: NEGATIVE
Glucose, UA: NEGATIVE mg/dL
Hgb urine dipstick: NEGATIVE
Ketones, ur: NEGATIVE mg/dL
Leukocytes,Ua: NEGATIVE
Nitrite: NEGATIVE
Protein, ur: NEGATIVE mg/dL
Specific Gravity, Urine: >1.046 — ABNORMAL HIGH (ref 1.005–1.030)
pH: 5 (ref 5.0–8.0)

## 2021-12-01 LAB — CBC
HCT: 45 % (ref 39.0–52.0)
Hemoglobin: 15 g/dL (ref 13.0–17.0)
MCH: 31.4 pg (ref 26.0–34.0)
MCHC: 33.3 g/dL (ref 30.0–36.0)
MCV: 94.1 fL (ref 80.0–100.0)
Platelets: 240 10*3/uL (ref 150–400)
RBC: 4.78 MIL/uL (ref 4.22–5.81)
RDW: 13.2 % (ref 11.5–15.5)
WBC: 13.2 10*3/uL — ABNORMAL HIGH (ref 4.0–10.5)
nRBC: 0 % (ref 0.0–0.2)

## 2021-12-01 LAB — COMPREHENSIVE METABOLIC PANEL
ALT: 32 U/L (ref 0–44)
AST: 33 U/L (ref 15–41)
Albumin: 5.3 g/dL — ABNORMAL HIGH (ref 3.5–5.0)
Alkaline Phosphatase: 96 U/L (ref 38–126)
Anion gap: 11 (ref 5–15)
BUN: 23 mg/dL (ref 8–23)
CO2: 24 mmol/L (ref 22–32)
Calcium: 10.6 mg/dL — ABNORMAL HIGH (ref 8.9–10.3)
Chloride: 101 mmol/L (ref 98–111)
Creatinine, Ser: 0.96 mg/dL (ref 0.61–1.24)
GFR, Estimated: >60 mL/min (ref >60)
Glucose, Bld: 115 mg/dL — ABNORMAL HIGH (ref 70–99)
Potassium: 4.3 mmol/L (ref 3.5–5.1)
Sodium: 136 mmol/L (ref 135–145)
Total Bilirubin: 0.5 mg/dL (ref 0.3–1.2)
Total Protein: 8.7 g/dL — ABNORMAL HIGH (ref 6.5–8.1)

## 2021-12-01 LAB — LIPASE, BLOOD: Lipase: 39 U/L (ref 11–51)

## 2021-12-01 MED ORDER — ONDANSETRON HCL 4 MG/2ML IJ SOLN
4.0000 mg | Freq: Once | INTRAMUSCULAR | Status: AC
  Administered 2021-12-01: 4 mg via INTRAVENOUS
  Filled 2021-12-01: qty 2

## 2021-12-01 MED ORDER — IOHEXOL 300 MG/ML  SOLN
75.0000 mL | Freq: Once | INTRAMUSCULAR | Status: AC | PRN
  Administered 2021-12-01: 75 mL via INTRAVENOUS

## 2021-12-01 MED ORDER — OXYCODONE HCL 5 MG PO TABS: 5.0000 mg | ORAL_TABLET | Freq: Three times a day (TID) | ORAL | 0 refills | Status: AC | PRN

## 2021-12-01 MED ORDER — OXYCODONE HCL 5 MG PO TABS
5.0000 mg | ORAL_TABLET | Freq: Once | ORAL | Status: AC
  Administered 2021-12-01: 5 mg via ORAL
  Filled 2021-12-01: qty 1

## 2021-12-01 MED ORDER — HYDROMORPHONE HCL 1 MG/ML IJ SOLN
0.5000 mg | Freq: Once | INTRAMUSCULAR | Status: AC
  Administered 2021-12-01: 0.5 mg via INTRAVENOUS
  Filled 2021-12-01: qty 0.5

## 2021-12-01 NOTE — ED Provider Notes (Signed)
[facility] Provider Note    Event Date/Time   First MD Initiated Contact with Patient 12/01/21 1717     (approximate)   History   Abdominal Pain   HPI  Ahmon L Sitzer is a 61 y.o. male with history of rectal cancer status post resection now with ostomy bag back in July who comes in with concerns for abdominal pain.  Patient reports sudden onset of abdominal pain in the lower abdomen at 3 AM that was pretty severe with more liquidy stool coming out.  He denies this ever happening previously.  Denies any fevers or other concerns.  Physical Exam   Triage Vital Signs: ED Triage Vitals  Enc Vitals Group     BP 12/01/21 1643 128/86     Pulse Rate 12/01/21 1643 (!) 53     Resp 12/01/21 1643 20     Temp 12/01/21 1643 97.6 F (36.4 C)     Temp Source 12/01/21 1643 Oral     SpO2 12/01/21 1643 98 %     Weight 12/01/21 1644 108 lb 14.5 oz (49.4 kg)     Height 12/01/21 1644 '5\' 7"'$  (1.702 m)     Head Circumference --      Peak Flow --      Pain Score 12/01/21 1644 5     Pain Loc --      Pain Edu? --      Excl. in [place]? --     Most recent vital signs: Vitals:   12/01/21 1643 12/01/21 1728  BP: 128/86 (!) 126/91  Pulse: (!) 53 79  Resp: 20 20  Temp: 97.6 F (36.4 C)   SpO2: 98% 100%     General: Awake, no distress.  CV:  Good peripheral perfusion.  Resp:  Normal effort.  Abd:  No distention.  Patient has tenderness throughout his abdomen.  He has an ostomy bag in place he is got liquid yellowish stool noted. Other:     ED Results / Procedures / Treatments   Labs (all labs ordered are listed, but only abnormal results are displayed) Labs Reviewed  COMPREHENSIVE METABOLIC PANEL - Abnormal; Notable for the following components:      Result Value   Glucose, Bld 115 (*)    Calcium 10.6 (*)    Total Protein 8.7 (*)    Albumin 5.3 (*)    All other components within normal limits  CBC - Abnormal; Notable for the following components:   WBC 13.2  (*)    All other components within normal limits  LIPASE, BLOOD  URINALYSIS, ROUTINE W REFLEX MICROSCOPIC    RADIOLOGY I have reviewed the CT personally and interpreted and patient has no evidence of obstruction but he does have some feces in the small bowel up to the area of the ileostomy site with some mild to moderate dilation.    IMPRESSION: Mild to moderate small bowel dilatation, with feces in the distal small bowel up to the right lower quadrant ileostomy site. No other obstructing etiology visualized.  Perirectal post treatment changes, with decreased rectal wall thickening. No evidence of metastatic carcinoma.  Stable mildly enlarged prostate.  Aortic Atherosclerosis (ICD10-I70.0).   PROCEDURES:  Critical Care performed: No  Procedures   MEDICATIONS ORDERED IN ED: Medications  HYDROmorphone (DILAUDID) injection 0.5 mg (0.5 mg Intravenous Given 12/01/21 1735)  ondansetron (ZOFRAN) injection 4 mg (4 mg Intravenous Given 12/01/21 1734)  iohexol (OMNIPAQUE) 300 MG/ML solution 75 mL (75 mLs Intravenous Contrast Given  12/01/21 1746)     IMPRESSION / MDM / ASSESSMENT AND PLAN / ED COURSE  I reviewed the triage vital signs and the nursing notes.   Patient's presentation is most consistent with acute presentation with potential threat to life or bodily function.   Differential includes small bowel obstruction, diverticulitis, perforation.  Will get CT imaging to further evaluate.  He denies any chest pain shortness of breath therefore unlikely ACS.  CBC shows elevated white count but patient is afebrile no other infectious symptoms.  His lipase is normal.  His CMP shows slightly elevated calcium.  I discussed case with Dr. Peyton Najjar given patient's significant pain with the dilated loops of bowel.  He is reviewing the CT imaging.    Patient was seen by Dr. Peyton Najjar and he does report that he feels that this is related to constipation.  Patient has run out of his pain meds  but we discussed that this could be making the constipation worse.  However he is adamant that he needs a few pain medications.  We discussed using MiraLAX and if this is not getting better then to go follow-up with his [employer].  He expressed understanding felt comfortable with plan.  Patient is adamant that he needs some oxycodone to try to help with his pain.  We discussed that this is should be done by his cancer team or by his team who did the surgery but will give him 3 days in order to help facilitate follow-up.  He expressed understanding.  Understands the oxycodone can worsen constipation and to try to use it very limited    FINAL CLINICAL IMPRESSION(S) / ED DIAGNOSES   Final diagnoses:  Constipation, unspecified constipation type  Rectal cancer ([place])  History of creation of ostomy [facility])     Rx / DC Orders   ED Discharge Orders     None        Note:  This document was prepared using Dragon voice recognition software and may include unintentional dictation errors.   Vanessa Seneca, MD 12/01/21 1944
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
## 2021-12-01 NOTE — ED Notes (Signed)
See triage note. Pt ambulatory to room. Pt reports he had a tumor removed and had an ostomy bag in place for about 77month. Pt reports increased abdominal pain that has become unbearable.

## 2021-12-01 NOTE — ED Notes (Signed)
First nurse-pt brought in via ems from home.  Pt has abd pain for 1 day.  Ems report pt has a colostomy and that stool is now yellow.  Pt ambulatory.  Bp 120/80, p-88, oxygen sats 99% per ems.

## 2021-12-01 NOTE — ED Triage Notes (Signed)
Pt here with abd pain that started last night. Pt states his entire abd hurts. Pt having nausea. Pt had a tumor removed for rectal CA.

## 2021-12-01 NOTE — ED Provider Triage Note (Signed)
Emergency Medicine Provider Triage Evaluation Note  Merton BARON PARMELEE, a 61 y.o. male  was evaluated in triage.  Pt complains of abdominal pain with onset last night.  He reports neurolyse abdominal discomfort with associated nausea.  Patient is a cancer patient with a recent rectal tumor removal..  Review of Systems  Positive: Gen abd pain Negative: FCS  Physical Exam  There were no vitals taken for this visit. Gen:   Awake, no distress  NAD Resp:  Normal effort CTA MSK:   Moves extremities without difficulty  Other:  Soft, nontneder, normal bowel sounds  Medical Decision Making  Medically screening exam initiated at 4:43 PM.  Appropriate orders placed.  Mang L Keltz was informed that the remainder of the evaluation will be completed by another provider, this initial triage assessment does not replace that evaluation, and the importance of remaining in the ED until their evaluation is complete.  Patient to the ED for evaluation of generalized abdominal discomfort.  He is a cancer patient with rectal mass history.   Melvenia Needles, PA-C 12/01/21 1647

## 2021-12-01 NOTE — Discharge Instructions (Addendum)
Give MiraLAX and take 1 capsule in the morning and 1 capsule at nighttime.  Take the oxycodone sparingly for pain as this can cause worsening constipation.  You need to follow-up with your surgical team or oncology team for additional pain medication management.  Return for fevers, worsening pain or any other concerns  IMPRESSION: Mild to moderate small bowel dilatation, with feces in the distal small bowel up to the right lower quadrant ileostomy site. No other obstructing etiology visualized.   Perirectal post treatment changes, with decreased rectal wall thickening. No evidence of metastatic carcinoma.   Stable mildly enlarged prostate.   Aortic Atherosclerosis (ICD10-I70.0).  Take oxycodone as prescribed. Do not drink alcohol, drive or participate in any other potentially dangerous activities while taking this medication as it may make you sleepy. Do not take this medication with any other sedating medications, either prescription or over-the-counter. If you were prescribed Percocet or Vicodin, do not take these with acetaminophen (Tylenol) as it is already contained within these medications.  This medication is an opiate (or narcotic) pain medication and can be habit forming. Use it as little as possible to achieve adequate pain control. Do not use or use it with extreme caution if you have a history of opiate abuse or dependence. If you are on a pain contract with your primary care doctor or a pain specialist, be sure to let them know you were prescribed this medication today from the Emergency Department. This medication is intended for your use only - do not give any to anyone else and keep it in a secure place where nobody else, especially children, have access to it.

## 2021-12-02 ENCOUNTER — Ambulatory Visit: Payer: Medicaid Other | Admitting: Cardiology

## 2021-12-08 ENCOUNTER — Inpatient Hospital Stay: Payer: Medicaid Other

## 2021-12-08 ENCOUNTER — Encounter: Payer: Self-pay | Admitting: Internal Medicine

## 2021-12-08 ENCOUNTER — Inpatient Hospital Stay: Payer: Medicaid Other | Attending: Radiation Oncology

## 2021-12-08 ENCOUNTER — Inpatient Hospital Stay (HOSPITAL_BASED_OUTPATIENT_CLINIC_OR_DEPARTMENT_OTHER): Payer: Medicaid Other | Admitting: Internal Medicine

## 2021-12-08 ENCOUNTER — Telehealth: Payer: Self-pay

## 2021-12-08 VITALS — BP 97/75 | HR 54 | Temp 96.7°F | Ht 67.0 in | Wt 114.6 lb

## 2021-12-08 DIAGNOSIS — R911 Solitary pulmonary nodule: Secondary | ICD-10-CM | POA: Diagnosis not present

## 2021-12-08 DIAGNOSIS — Z95828 Presence of other vascular implants and grafts: Secondary | ICD-10-CM

## 2021-12-08 DIAGNOSIS — R109 Unspecified abdominal pain: Secondary | ICD-10-CM | POA: Insufficient documentation

## 2021-12-08 DIAGNOSIS — C20 Malignant neoplasm of rectum: Secondary | ICD-10-CM | POA: Insufficient documentation

## 2021-12-08 DIAGNOSIS — G629 Polyneuropathy, unspecified: Secondary | ICD-10-CM | POA: Insufficient documentation

## 2021-12-08 DIAGNOSIS — Z87891 Personal history of nicotine dependence: Secondary | ICD-10-CM | POA: Insufficient documentation

## 2021-12-08 DIAGNOSIS — F419 Anxiety disorder, unspecified: Secondary | ICD-10-CM | POA: Insufficient documentation

## 2021-12-08 DIAGNOSIS — E611 Iron deficiency: Secondary | ICD-10-CM

## 2021-12-08 DIAGNOSIS — Z79899 Other long term (current) drug therapy: Secondary | ICD-10-CM | POA: Insufficient documentation

## 2021-12-08 LAB — COMPREHENSIVE METABOLIC PANEL
ALT: 20 U/L (ref 0–44)
AST: 23 U/L (ref 15–41)
Albumin: 4.2 g/dL (ref 3.5–5.0)
Alkaline Phosphatase: 74 U/L (ref 38–126)
Anion gap: 4 — ABNORMAL LOW (ref 5–15)
BUN: 19 mg/dL (ref 8–23)
CO2: 25 mmol/L (ref 22–32)
Calcium: 9.1 mg/dL (ref 8.9–10.3)
Chloride: 106 mmol/L (ref 98–111)
Creatinine, Ser: 0.8 mg/dL (ref 0.61–1.24)
GFR, Estimated: >60 mL/min (ref >60)
Glucose, Bld: 98 mg/dL (ref 70–99)
Potassium: 4 mmol/L (ref 3.5–5.1)
Sodium: 135 mmol/L (ref 135–145)
Total Bilirubin: 0.3 mg/dL (ref 0.3–1.2)
Total Protein: 7.1 g/dL (ref 6.5–8.1)

## 2021-12-08 LAB — CBC WITH DIFFERENTIAL/PLATELET
Abs Immature Granulocytes: 0.03 10*3/uL (ref 0.00–0.07)
Basophils Absolute: 0.1 10*3/uL (ref 0.0–0.1)
Basophils Relative: 1 %
Eosinophils Absolute: 0.4 10*3/uL (ref 0.0–0.5)
Eosinophils Relative: 7 %
HCT: 34.3 % — ABNORMAL LOW (ref 39.0–52.0)
Hemoglobin: 11.6 g/dL — ABNORMAL LOW (ref 13.0–17.0)
Immature Granulocytes: 1 %
Lymphocytes Relative: 17 %
Lymphs Abs: 1 10*3/uL (ref 0.7–4.0)
MCH: 31.7 pg (ref 26.0–34.0)
MCHC: 33.8 g/dL (ref 30.0–36.0)
MCV: 93.7 fL (ref 80.0–100.0)
Monocytes Absolute: 0.5 10*3/uL (ref 0.1–1.0)
Monocytes Relative: 9 %
Neutro Abs: 3.6 10*3/uL (ref 1.7–7.7)
Neutrophils Relative %: 65 %
Platelets: 192 10*3/uL (ref 150–400)
RBC: 3.66 MIL/uL — ABNORMAL LOW (ref 4.22–5.81)
RDW: 13 % (ref 11.5–15.5)
WBC: 5.6 10*3/uL (ref 4.0–10.5)
nRBC: 0 % (ref 0.0–0.2)

## 2021-12-08 MED ORDER — HEPARIN SOD (PORK) LOCK FLUSH 100 UNIT/ML IV SOLN
500.0000 [IU] | Freq: Once | INTRAVENOUS | Status: DC | PRN
  Filled 2021-12-08: qty 5

## 2021-12-08 MED ORDER — HYDROCODONE-ACETAMINOPHEN 5-325 MG PO TABS
1.0000 | ORAL_TABLET | Freq: Every day | ORAL | 0 refills | Status: DC | PRN
Start: 2021-12-08 — End: 2022-04-18

## 2021-12-08 MED ORDER — SODIUM CHLORIDE 0.9% FLUSH
10.0000 mL | Freq: Once | INTRAVENOUS | Status: AC | PRN
  Administered 2021-12-08: 10 mL
  Filled 2021-12-08: qty 10

## 2021-12-08 MED ORDER — HEPARIN SOD (PORK) LOCK FLUSH 100 UNIT/ML IV SOLN
500.0000 [IU] | Freq: Once | INTRAVENOUS | Status: AC
  Administered 2021-12-08: 500 [IU] via INTRAVENOUS
  Filled 2021-12-08: qty 5

## 2021-12-08 NOTE — Assessment & Plan Note (Signed)
#   OCT 2022-Rectal cancer-  locally advanced disease. MRI- OCT 2022- T4bN2; ? M [see below].  s/p  concurrent chemoradiation with Xeloda. Radiation [finished DEC 12th, 2022].  S/p FOLFOX x 8 cycles [may 2023-1st week]. CT May, 2023-  Unchanged circumferential wall thickening of the low rectum, consistent with primary rectal malignancy. Slightly increased adjacent perirectal fat stranding and presacral soft tissue thickening, consistent with developing post treatment change. No evidence of lymphadenopathy or metastatic disease in the abdomen or pelvis.   MRI JUNE 2023-Significantly decreased tumor in the low rectum, although with persistent  there is persistent thickened along the anterior wall the rectum at site of rectal carcinoma. No clear fat plane between the anterior wall the rectum and the posterior wall of the prostate;  No measurable  lymph nodes in the mesial rectal sheath.  # S/p Low anterior resection with hand-sewn coloanal anastomosis, DLI on 10/03/21 by Dr. Maxie Better; [id number].  I reviewed the pathology with the patient. SEP 7th, 2023 [ER- CT]- No evidence of disease; awaiting reversal on 9/20 at [facility].   # pain from surgery [July 10th, 2023]- declines tramadol. Will call in Hydrocodone 1 daily prn. 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  # Diarrhea/Ostomy G-2;-  Continue  Lomotil as needed- prn.STABLE.  # PET 2022-LEFT lower lobe nodule with increased metabolic activity suspicious for either metastatic colorectal neoplasm or second primary.  Mildly reduced in size, currently 0.8 by 0.6 cm [ formerly 1.1 by 0.9 cm]-monitor for now. MAY 30th 2023- CT scan- Resolution or decreased size of bilateral upper lobe nodular densities since prior study. Will repeat imaging for NOv. Ordered CT today.    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  psychiatrist.   # IV Access: port- functional;  discussed re: pro  and cons of keeping the port vs. Explantation. Patient interested in have the port taekn out. I think this could be taken out at the time of the ostomy reversal at [facility] on Sep 20th/[facility]. Informed Barbie Haggis pt  # DISPOSITION: # NO  IVFs over 1 hour; De-access #  Follow up  2  monts-MD; labs/port - cbc/cmp; possible IVFs over 1 hour; CT chest -Dr.B
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
## 2021-12-08 NOTE — Telephone Encounter (Signed)
Pharmacy calling to confirm only medication prescription given to patient today was Hydrocodone because he cut portions of the rx that had "void".  Confirmed to pharmacy that patient was only give rx for Hydrocodone today, no other rx.

## 2021-12-08 NOTE — Progress Notes (Unsigned)
Can he get something for pain?  Went to [facility] to have stoma fixed, was clogged.  Does feel like he needs fluids today.

## 2021-12-08 NOTE — Progress Notes (Signed)
Nutrition Follow-up:  Patient with locally advanced rectal cancer.  Patient s/p surgery at [facility] on 10/03/2021 with ileostomy.    Met with patient following MD visit.  Patient reports that appetite has improved a little bit.  Says that he mainly eats 2 times per day (breakfast and supper).  He and son do not cook and eat out mostly.  Has been drinking 3 boost shakes a day.  Noted ED visit due to constipation.  Patient says that he is using miralax when needed.   Says that he will have reversal on 9/20 at [facility].      Medications: reviewed  Labs: reviewed  Anthropometrics:   Weight 114 lb 9.6 oz today, increased  109 lb on 8/14  124 lb on 5/3 127 lb on 4/19    NUTRITION DIAGNOSIS: Inadequate oral intake improving    INTERVENTION:  Encouraged patient to continue oral nutrition supplements TID Encouraged patient to continue eating high calorie, high protein foods Provided education on Ileostomy Nutrition Therapy.  Handout provided from [facility].     MONITORING, EVALUATION, GOAL: weight trends, intake   NEXT VISIT: Tuesday, Nov 14  Joli B. Allen, RD, LDN Registered Dietitian [phone]   

## 2021-12-09 ENCOUNTER — Encounter: Payer: Self-pay | Admitting: Internal Medicine

## 2021-12-09 ENCOUNTER — Other Ambulatory Visit: Payer: Self-pay

## 2021-12-11 ENCOUNTER — Other Ambulatory Visit: Payer: Self-pay

## 2021-12-12 ENCOUNTER — Other Ambulatory Visit: Payer: Self-pay

## 2021-12-13 ENCOUNTER — Other Ambulatory Visit: Payer: Self-pay

## 2021-12-28 ENCOUNTER — Other Ambulatory Visit: Payer: Self-pay

## 2022-01-12 ENCOUNTER — Ambulatory Visit
Admission: RE | Admit: 2022-01-12 | Discharge: 2022-01-12 | Disposition: A | Discharge status: Home / Self Care | Payer: Medicaid Other | Source: Ambulatory Visit | Attending: Radiation Oncology | Admitting: Radiation Oncology

## 2022-01-12 ENCOUNTER — Inpatient Hospital Stay: Payer: Medicaid Other | Attending: Radiation Oncology

## 2022-01-12 ENCOUNTER — Telehealth: Payer: Self-pay | Admitting: *Deleted

## 2022-01-12 ENCOUNTER — Ambulatory Visit
Admission: RE | Admit: 2022-01-12 | Discharge: 2022-01-12 | Disposition: A | Discharge status: Home / Self Care | Payer: Medicaid Other | Source: Ambulatory Visit | Attending: Internal Medicine | Admitting: Internal Medicine

## 2022-01-12 VITALS — BP 112/77 | HR 67 | Temp 98.0°F | Resp 18 | Ht 67.0 in | Wt 113.7 lb

## 2022-01-12 DIAGNOSIS — Z9221 Personal history of antineoplastic chemotherapy: Secondary | ICD-10-CM | POA: Diagnosis not present

## 2022-01-12 DIAGNOSIS — C20 Malignant neoplasm of rectum: Secondary | ICD-10-CM | POA: Insufficient documentation

## 2022-01-12 DIAGNOSIS — Z933 Colostomy status: Secondary | ICD-10-CM | POA: Insufficient documentation

## 2022-01-12 DIAGNOSIS — Z923 Personal history of irradiation: Secondary | ICD-10-CM | POA: Diagnosis not present

## 2022-01-12 DIAGNOSIS — R911 Solitary pulmonary nodule: Secondary | ICD-10-CM | POA: Diagnosis present

## 2022-01-12 MED ORDER — IOHEXOL 300 MG/ML  SOLN
60.0000 mL | Freq: Once | INTRAMUSCULAR | Status: AC | PRN
  Administered 2022-01-12: 60 mL via INTRAVENOUS

## 2022-01-12 NOTE — Progress Notes (Signed)
Radiation Oncology Follow up Note  Name: [name] [name]   Date:   01/12/2022 MRN:  [id number] DOB: Jan 27, 1961    This 61 y.o. male presents to the clinic today for 38-monthfollow-up status post neoadjuvant chemoradiation therapy for locally advanced low rectal adenocarcinoma.  REFERRING PROVIDER: [facility]  HPI: Patient is a 61year old male now out 10 months having completed neoadjuvant chemoradiation prior to surgical resection..  At the time of surgery he had 2.2 cm residual disease with all margins negative and 12 lymph nodes all negative for metastatic disease he is currently being evaluated for colostomy reversal had some dilatation of his rectum this week which is causing substantial pain I am trying to get him into symptom management.  CT scan back in September showed mild to moderate small bowel dilatation distal to the small bowel up to the right lower quadrant ileostomy site no other obstructing etiology was noted there was perirectal posttreatment changes with decreased rectal wall thickening no evidence of metastatic disease.  COMPLICATIONS OF TREATMENT: none  FOLLOW UP COMPLIANCE: keeps appointments   PHYSICAL EXAM:  BP 112/77   Pulse 67   Temp 98 F (36.7 C)   Resp 18   Ht '5\' 7"'$  (1.702 m)   Wt 113 lb 11.2 oz (51.6 kg)   BMI 17.81 kg/m  Patient's colostomy is functioning well.  Thin frail male in NAD and significant pain.  Well-developed well-nourished patient in NAD. HEENT reveals PERLA, EOMI, discs not visualized.  Oral cavity is clear. No oral mucosal lesions are identified. Neck is clear without evidence of cervical or supraclavicular adenopathy. Lungs are clear to A&P. Cardiac examination is essentially unremarkable with regular rate and rhythm without murmur rub or thrill. Abdomen is benign with no organomegaly or masses noted. Motor sensory and DTR levels are equal and symmetric in the upper and lower extremities. Cranial nerves II through XII are  grossly intact. Proprioception is intact. No peripheral adenopathy or edema is identified. No motor or sensory levels are noted. Crude visual fields are within normal range.  RADIOLOGY RESULTS: CT scan reviewed compatible with above-stated findings  PLAN: Present time patient is doing well with no evidence of disease.  They are trying to reverse his colostomy.  Having significant pain from dilatation this week referring him to symptom management for pain management.  I have asked to see him back in 6 months for follow-up.  Patient is to call anytime with any concerns.  I would like to take this opportunity to thank you for allowing me to participate in the care of your patient..Noreene Filbert MD

## 2022-01-12 NOTE — Progress Notes (Signed)
Patient request to see his MD, but could not be worked in.  Dr. Rogue Bussing messaged that he could see an APP for his concerns.    Mr. Wojdyla had a procedure on 01/11/22 of having his rectum stretched and was told to use tylenol and ibuprofen.  Patient states that it has not worked for him.  That he is still having a a lot of pain rating 8/10.  Patient advised to contact his surgeon for pain medication.    Unable to be seen in [facility] today.

## 2022-01-12 NOTE — Telephone Encounter (Signed)
12/03/21- Received message from radiation therapy rn that patient requested to see Dr. B to be evaluated for "some concerns about his port and some medication." [facility] RN inquired about pt's symptoms. Per Maudie Mercury in radiation pt reports "pain from having his rectum stretched and concerns about his port."  [facility] RN reviewed pt's chart in detail - pt had a dilation of an anorectal stricture ysterday at [facility]. His post d/c instructions were as follows per epic- "Pain control: Use tylenol and ibuprofen over the counter as instructed for mild and moderate pain. Wound care: No wound care needed but keep the area clean and free of stool. For additional pain relief and good perianal hygiene, do warm bath soaks (do not add epsom salts, betadine, bleach etc) several times a day and as needed. Another alternative is to stand in the shower allowing the warm water to hit your backside."  I spoke with Merrily Pew, NP per Merrily Pew- patient needs to contact his surgeon if he is having pain r/t to his procedure yesterday. Per Merrily Pew, - NP talked with Dr. Rogue Bussing who recommended that patient follow up with surgeon - Dr. Maxie Better

## 2022-01-27 ENCOUNTER — Ambulatory Visit: Payer: Medicaid Other | Attending: Cardiology | Admitting: Cardiology

## 2022-01-30 ENCOUNTER — Telehealth: Payer: Self-pay

## 2022-01-30 ENCOUNTER — Other Ambulatory Visit: Payer: Self-pay | Admitting: Medical Oncology

## 2022-01-30 DIAGNOSIS — C20 Malignant neoplasm of rectum: Secondary | ICD-10-CM

## 2022-01-30 DIAGNOSIS — R911 Solitary pulmonary nodule: Secondary | ICD-10-CM

## 2022-01-30 NOTE — Telephone Encounter (Signed)
-----   Message from Cammie Sickle, MD sent at 01/29/2022  2:27 PM EST ----- Please consider ordering PET scan for further evaluation.  Biopsy of possible/review at cancer conference.  Most likely will end up needing SBRT.  Thank you GB

## 2022-01-30 NOTE — Telephone Encounter (Signed)
Please review and advise on how to proceed.

## 2022-01-31 ENCOUNTER — Encounter: Payer: Self-pay | Admitting: Cardiology

## 2022-02-01 ENCOUNTER — Encounter: Payer: Self-pay | Admitting: Internal Medicine

## 2022-02-01 NOTE — Telephone Encounter (Signed)
Auth pending for PET scan. Once authorized, will schedule and notify pt.

## 2022-02-02 ENCOUNTER — Other Ambulatory Visit: Payer: Self-pay

## 2022-02-02 NOTE — Telephone Encounter (Signed)
Pt scheduled for PET on 02/09/22 at 12:30am. Will need to make him aware at his next visit on 11/14.

## 2022-02-07 ENCOUNTER — Other Ambulatory Visit: Payer: Self-pay | Admitting: *Deleted

## 2022-02-07 ENCOUNTER — Ambulatory Visit: Payer: Medicaid Other | Admitting: Internal Medicine

## 2022-02-07 ENCOUNTER — Ambulatory Visit: Payer: Medicaid Other

## 2022-02-07 ENCOUNTER — Inpatient Hospital Stay: Payer: Medicaid Other

## 2022-02-07 ENCOUNTER — Inpatient Hospital Stay (HOSPITAL_BASED_OUTPATIENT_CLINIC_OR_DEPARTMENT_OTHER): Payer: Medicaid Other | Admitting: Oncology

## 2022-02-07 ENCOUNTER — Encounter: Payer: Self-pay | Admitting: Oncology

## 2022-02-07 ENCOUNTER — Other Ambulatory Visit: Payer: Medicaid Other

## 2022-02-07 ENCOUNTER — Inpatient Hospital Stay: Payer: Medicaid Other | Attending: Radiation Oncology

## 2022-02-07 ENCOUNTER — Encounter: Payer: Self-pay | Admitting: *Deleted

## 2022-02-07 VITALS — BP 115/71 | HR 57 | Temp 96.0°F | Resp 16

## 2022-02-07 DIAGNOSIS — R911 Solitary pulmonary nodule: Secondary | ICD-10-CM | POA: Diagnosis not present

## 2022-02-07 DIAGNOSIS — D75839 Thrombocytosis, unspecified: Secondary | ICD-10-CM | POA: Insufficient documentation

## 2022-02-07 DIAGNOSIS — C20 Malignant neoplasm of rectum: Secondary | ICD-10-CM | POA: Diagnosis not present

## 2022-02-07 DIAGNOSIS — Z87891 Personal history of nicotine dependence: Secondary | ICD-10-CM | POA: Diagnosis not present

## 2022-02-07 DIAGNOSIS — D649 Anemia, unspecified: Secondary | ICD-10-CM | POA: Diagnosis not present

## 2022-02-07 DIAGNOSIS — Z85048 Personal history of other malignant neoplasm of rectum, rectosigmoid junction, and anus: Secondary | ICD-10-CM | POA: Diagnosis present

## 2022-02-07 LAB — CBC WITH DIFFERENTIAL/PLATELET
Abs Immature Granulocytes: 0.06 10*3/uL (ref 0.00–0.07)
Basophils Absolute: 0.1 10*3/uL (ref 0.0–0.1)
Basophils Relative: 1 %
Eosinophils Absolute: 0.3 10*3/uL (ref 0.0–0.5)
Eosinophils Relative: 4 %
HCT: 30.9 % — ABNORMAL LOW (ref 39.0–52.0)
Hemoglobin: 10.3 g/dL — ABNORMAL LOW (ref 13.0–17.0)
Immature Granulocytes: 1 %
Lymphocytes Relative: 15 %
Lymphs Abs: 1.1 10*3/uL (ref 0.7–4.0)
MCH: 31.7 pg (ref 26.0–34.0)
MCHC: 33.3 g/dL (ref 30.0–36.0)
MCV: 95.1 fL (ref 80.0–100.0)
Monocytes Absolute: 0.4 10*3/uL (ref 0.1–1.0)
Monocytes Relative: 6 %
Neutro Abs: 5.6 10*3/uL (ref 1.7–7.7)
Neutrophils Relative %: 73 %
Platelets: 411 10*3/uL — ABNORMAL HIGH (ref 150–400)
RBC: 3.25 MIL/uL — ABNORMAL LOW (ref 4.22–5.81)
RDW: 12.5 % (ref 11.5–15.5)
WBC: 7.4 10*3/uL (ref 4.0–10.5)
nRBC: 0 % (ref 0.0–0.2)

## 2022-02-07 LAB — COMPREHENSIVE METABOLIC PANEL
ALT: 13 U/L (ref 0–44)
AST: 21 U/L (ref 15–41)
Albumin: 3.6 g/dL (ref 3.5–5.0)
Alkaline Phosphatase: 64 U/L (ref 38–126)
Anion gap: 8 (ref 5–15)
BUN: 13 mg/dL (ref 8–23)
CO2: 23 mmol/L (ref 22–32)
Calcium: 8.7 mg/dL — ABNORMAL LOW (ref 8.9–10.3)
Chloride: 103 mmol/L (ref 98–111)
Creatinine, Ser: 0.65 mg/dL (ref 0.61–1.24)
GFR, Estimated: >60 mL/min (ref >60)
Glucose, Bld: 155 mg/dL — ABNORMAL HIGH (ref 70–99)
Potassium: 3.7 mmol/L (ref 3.5–5.1)
Sodium: 134 mmol/L — ABNORMAL LOW (ref 135–145)
Total Bilirubin: 0.3 mg/dL (ref 0.3–1.2)
Total Protein: 7.1 g/dL (ref 6.5–8.1)

## 2022-02-07 MED ORDER — OXYCODONE HCL 5 MG PO TABS: 5.0000 mg | ORAL_TABLET | ORAL | 0 refills | Status: DC | PRN

## 2022-02-07 MED ORDER — SODIUM CHLORIDE 0.9% FLUSH
10.0000 mL | Freq: Once | INTRAVENOUS | Status: AC
  Filled 2022-02-07: qty 10

## 2022-02-07 MED ORDER — HEPARIN SOD (PORK) LOCK FLUSH 100 UNIT/ML IV SOLN
500.0000 [IU] | Freq: Once | INTRAVENOUS | Status: AC
  Administered 2022-02-07: 500 [IU] via INTRAVENOUS
  Filled 2022-02-07: qty 5

## 2022-02-07 NOTE — Progress Notes (Signed)
Pt had surgery at [facility] for colostomy reversal.  Pt having severe pain in rectum "from them stretching".  Pt was given oxycodone but has run out and requesting more pain medication and a refill on protonix for "heart burn".

## 2022-02-07 NOTE — Progress Notes (Signed)
Met with patient during follow up visit with Dr. Grayland Ormond to discuss recent CT scan results. All questions answered during visit. Reviewed upcoming appts with patient for PET scan and follow up. Informed pt that will call him with results of his PET scan and next steps if needed. Contact info given and instructed to call with any questions or needs. Pt verbalized understanding. Nothing further needed at this time.

## 2022-02-07 NOTE — Progress Notes (Signed)
Nutrition Follow-up:  Patient with locally advanced rectal cancer.  Patient s/p surgery at [facility] for reversal on 11/2.    Spoke with patient via phone for nutrition follow-up.  Patient reports that he has not been eating too much because he does not want to have a bowel movement due to the pain.  Pain medication was addressed today at MD visit.  Says that he has been drinking boost shakes 3-4 per day and eating cereal, egg sandwich.     Medications: reviewed  Labs: Na 134, glucose 155  Anthropometrics:   No weight taken today in clinic  116 lb on 11/2 at [facility] 113 lb on 10/19 109 lb on 8/14 124 lb on 5/3 127 lb on 4/19   NUTRITION DIAGNOSIS: Inadequate oral intake ongoing    INTERVENTION:  Reviewed bland diet with patient. Encouraged good sources of protein. Encouraged continued use of oral nutrition supplements for added nutrition    MONITORING, EVALUATION, GOAL: weight trends, intake   NEXT VISIT: Tuesday, Dec 19 phone call  Jermani Pund B. Zenia Resides, [place], [place] Registered Dietitian [phone]
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
## 2022-02-08 ENCOUNTER — Other Ambulatory Visit: Payer: Self-pay

## 2022-02-08 ENCOUNTER — Encounter: Payer: Self-pay | Admitting: Internal Medicine

## 2022-02-09 ENCOUNTER — Encounter: Payer: Self-pay | Admitting: *Deleted

## 2022-02-09 ENCOUNTER — Other Ambulatory Visit: Payer: Self-pay | Admitting: *Deleted

## 2022-02-09 ENCOUNTER — Inpatient Hospital Stay: Payer: Medicaid Other

## 2022-02-09 ENCOUNTER — Ambulatory Visit
Admission: RE | Admit: 2022-02-09 | Discharge: 2022-02-09 | Disposition: A | Discharge status: Home / Self Care | Payer: Medicaid Other | Source: Ambulatory Visit | Attending: Oncology | Admitting: Oncology

## 2022-02-09 DIAGNOSIS — R911 Solitary pulmonary nodule: Secondary | ICD-10-CM

## 2022-02-09 DIAGNOSIS — C20 Malignant neoplasm of rectum: Secondary | ICD-10-CM

## 2022-02-09 NOTE — Progress Notes (Signed)
Med list updated after increasing dosage of oxycodone.

## 2022-02-09 NOTE — Progress Notes (Signed)
Received message from PET scan dept that pt was unable to complete PET scan today due to increased pain from recent colostomy reversal. Pt states that he is unable to lay on his back for extended periods of time without being in pain. Pt took pain medication this morning but did not bring with him to his appt. Phone call made to pt to follow up with him to reschedule PET and address pain management. Pt stated that he would like to have his scan after thanksgiving to give him time to heal from recent surgery. PET scan rescheduled to 12/4 at 10:30am. Transportation arranged. Per Dr. Grayland Ormond, may increase pain meds to oxycodone '10mg'$  every 4 hours PRN. Pt instructed to take 2 tablets every 4 hours until needs a refill when at that time will send in prescription for '10mg'$  tablet. Pt verbalized understanding. Nothing further needed at this time.

## 2022-02-20 ENCOUNTER — Telehealth: Payer: Self-pay | Admitting: *Deleted

## 2022-02-21 ENCOUNTER — Encounter: Payer: Self-pay | Admitting: Internal Medicine

## 2022-02-21 NOTE — Telephone Encounter (Signed)
Per Dr. Jacinto Reap, pt will need to seek further refills at [facility] for post op pain or seek further evaluation at the pain clinic. Pt made aware and he stated that he will "deal with the pain". Pt made aware that he can try OTC tylenol and if he changes his mind about the referral to the pain clinic then to let me know. Pt reminded about upcoming appts for PET scan on Monday 12/4 and reiterated that he cannot eat or drink anything except water only Monday morning. Pt verbalized understanding. Nothing further needed at this time.

## 2022-02-23 ENCOUNTER — Ambulatory Visit: Payer: Medicaid Other | Admitting: Cardiology

## 2022-02-27 ENCOUNTER — Ambulatory Visit: Payer: Medicaid Other

## 2022-02-27 ENCOUNTER — Inpatient Hospital Stay: Payer: Medicaid Other

## 2022-03-09 ENCOUNTER — Inpatient Hospital Stay: Payer: Medicaid Other | Attending: Radiation Oncology

## 2022-03-09 ENCOUNTER — Ambulatory Visit
Admission: RE | Admit: 2022-03-09 | Discharge: 2022-03-09 | Disposition: A | Discharge status: Home / Self Care | Payer: Medicaid Other | Source: Ambulatory Visit | Attending: Oncology | Admitting: Oncology

## 2022-03-09 VITALS — Ht 67.0 in | Wt 120.0 lb

## 2022-03-09 DIAGNOSIS — R911 Solitary pulmonary nodule: Secondary | ICD-10-CM | POA: Diagnosis not present

## 2022-03-09 DIAGNOSIS — Z9221 Personal history of antineoplastic chemotherapy: Secondary | ICD-10-CM | POA: Insufficient documentation

## 2022-03-09 DIAGNOSIS — K625 Hemorrhage of anus and rectum: Secondary | ICD-10-CM

## 2022-03-09 DIAGNOSIS — Z87891 Personal history of nicotine dependence: Secondary | ICD-10-CM | POA: Insufficient documentation

## 2022-03-09 DIAGNOSIS — Z85048 Personal history of other malignant neoplasm of rectum, rectosigmoid junction, and anus: Secondary | ICD-10-CM | POA: Insufficient documentation

## 2022-03-09 DIAGNOSIS — J439 Emphysema, unspecified: Secondary | ICD-10-CM | POA: Insufficient documentation

## 2022-03-09 DIAGNOSIS — I700 Atherosclerosis of aorta: Secondary | ICD-10-CM | POA: Diagnosis not present

## 2022-03-09 DIAGNOSIS — K6289 Other specified diseases of anus and rectum: Secondary | ICD-10-CM | POA: Diagnosis not present

## 2022-03-09 LAB — GLUCOSE, CAPILLARY: Glucose-Capillary: 99 mg/dL (ref 70–99)

## 2022-03-09 MED ORDER — FLUDEOXYGLUCOSE F - 18 (FDG) INJECTION
6.2000 | Freq: Once | INTRAVENOUS | Status: AC | PRN
  Administered 2022-03-09: 6.2 via INTRAVENOUS

## 2022-03-10 ENCOUNTER — Encounter: Payer: Self-pay | Admitting: Internal Medicine

## 2022-03-10 ENCOUNTER — Inpatient Hospital Stay (HOSPITAL_BASED_OUTPATIENT_CLINIC_OR_DEPARTMENT_OTHER): Payer: Medicaid Other | Admitting: Internal Medicine

## 2022-03-10 ENCOUNTER — Inpatient Hospital Stay: Payer: Medicaid Other

## 2022-03-10 VITALS — BP 99/66 | HR 61 | Temp 97.5°F | Resp 16 | Wt 119.5 lb

## 2022-03-10 DIAGNOSIS — C20 Malignant neoplasm of rectum: Secondary | ICD-10-CM | POA: Diagnosis not present

## 2022-03-10 DIAGNOSIS — Z87891 Personal history of nicotine dependence: Secondary | ICD-10-CM | POA: Diagnosis not present

## 2022-03-10 DIAGNOSIS — Z9221 Personal history of antineoplastic chemotherapy: Secondary | ICD-10-CM | POA: Diagnosis not present

## 2022-03-10 DIAGNOSIS — Z85048 Personal history of other malignant neoplasm of rectum, rectosigmoid junction, and anus: Secondary | ICD-10-CM | POA: Diagnosis present

## 2022-03-10 MED ORDER — PANTOPRAZOLE SODIUM 40 MG PO TBEC: 40.0000 mg | DELAYED_RELEASE_TABLET | Freq: Every day | ORAL | 1 refills | Status: DC | PRN

## 2022-03-10 MED ORDER — TAMSULOSIN HCL 0.4 MG PO CAPS: ORAL_CAPSULE | ORAL | 0 refills | Status: DC

## 2022-03-10 NOTE — Assessment & Plan Note (Signed)
#   OCT 2022-Rectal cancer-  locally advanced disease. MRI- OCT 2022- [id number]; ? M [see below]; JULY 2023-   # S/p Low anterior resection with hand-sewn coloanal anastomosis, DLI on 10/03/21 by Dr. Maxie Better; [id number].  PET DEC 2023-  Similar rectal wall thickening with perirectal fluid/soft tissue which is minimally metabolic and favored reflect posttreatment change.  No evidence of any recurrent disease.  # PET 2022- LEFT lower lobe nodule with increased metabolic activity suspicious for either metastatic colorectal neoplasm or second primary.  DEC 8329- Hypermetabolic 11 mm solid left upper lobe pulmonary nodule is suspicious for primary bronchogenic neoplasm versus metastatic disease. Suggest more definitive characterization with direct tissue sampling.  Plan discussed at tumor conference; referral to pulmonary for biopsy.  Unclear if patient is a good candidate for lung resection versus SBRT.  Discussed with [facility].  # pain from surgery [July 10th, 2023]-continues to complain of chronic pain.  Given history of narcotic abuse/would not refill any narcotic pain medication.  Offered referral to pain clinic patient declined.  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  Discussed with [employer].  # IV Access: port- functional;  discussed re: pro and cons of keeping the port vs. Explantation.    Lucianne Lei pt  # DISPOSITION: #  Follow up  in 6 weeks- MD; labs/port - cbc/cmp;CEA-  port flush-;Dr.B  # I reviewed the blood work- with the patient in detail; also reviewed the imaging independently [as summarized above]; and with the patient in detail.
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
## 2022-03-10 NOTE — Progress Notes (Unsigned)
Patient needing refills on Pantoprazole and Tamsulosin, pended for MD review.  Rectal pain 4/10 today.    Diarrhea episodes not on a daily basis and does not take medication.   BP 99/66, HR 61

## 2022-03-12 ENCOUNTER — Other Ambulatory Visit: Payer: Self-pay

## 2022-03-13 ENCOUNTER — Encounter: Payer: Self-pay | Admitting: Internal Medicine

## 2022-03-13 ENCOUNTER — Ambulatory Visit: Payer: Medicaid Other | Admitting: Cardiology

## 2022-03-14 ENCOUNTER — Inpatient Hospital Stay: Payer: Medicaid Other

## 2022-03-14 ENCOUNTER — Other Ambulatory Visit: Payer: Self-pay | Admitting: Pharmacist

## 2022-03-14 NOTE — Progress Notes (Signed)
Nutrition Follow-up:   Patient with locally advanced rectal cancer.  Patient s/p surgery at [facility] for reversal on 11/2  Spoke with patient via phone for nutrition follow-up.  Patient reports that appetite is better and eating more.  Having trouble with constipation.  Took miralax last night but has not seen results yet.  Says that he does not want to take a fleet's enema.  Says that he bought a new set of scales and weighed last night and was 121 lb but this morning weighed 115 lb.  Says that his son weighed on scales and was the same as he always is.  Concerned about weight loss.  Also reports that his baby brother was recently diagnosed with colon cancer   Medications: reviewed  Labs: reviewed  Anthropometrics:   Weight 119 lb 7.2 oz on 12/15 116 lb on 11/2 at [facility] 113 lb on 10/19 109 lb on 8/14 124 lb on 5/3 127 lb on 4/19   NUTRITION DIAGNOSIS: Inadequate oral intake improving   INTERVENTION:  Encouraged patient to weigh at same time of day and with same amount of clothing on (ie pajamas, no shoes).  Encouraged him to look at trend in weights. Gave patient number for triage nurse at [facility] if miralax not working for constipation.  Patient wrote this number down Continue oral nutrition supplement    MONITORING, EVALUATION, GOAL: weight trends, intake   NEXT VISIT: as needed  Arianna Haydon B. Zenia Resides, [place], [place] Registered Dietitian [phone]

## 2022-03-16 ENCOUNTER — Encounter: Payer: Self-pay | Admitting: *Deleted

## 2022-03-16 ENCOUNTER — Other Ambulatory Visit: Payer: Medicaid Other

## 2022-03-16 DIAGNOSIS — R911 Solitary pulmonary nodule: Secondary | ICD-10-CM

## 2022-03-16 DIAGNOSIS — C20 Malignant neoplasm of rectum: Secondary | ICD-10-CM

## 2022-03-16 NOTE — Progress Notes (Signed)
Spoke with patient and reviewed recommendations from tumor board. Advised that it is recommended for patient to pursue further workup of lung nodule and obtain tissue sample via bronchoscopy. Referral placed to  pulmonary. Pt informed to expect call from their clinic with an appt. Pt was hesitant to further workup but encouraged patient to at least discuss with pulmonary before making a decision. Pt is agreeable. Informed to keep follow up appt with Dr. B as scheduled and will arrange for transportation for pt to attend pulmonary consult once scheduled. Nothing further needed at this time.

## 2022-03-30 ENCOUNTER — Encounter: Payer: Self-pay | Admitting: Student in an Organized Health Care Education/Training Program

## 2022-03-30 ENCOUNTER — Inpatient Hospital Stay: Payer: Medicaid Other | Attending: Radiation Oncology

## 2022-03-30 ENCOUNTER — Ambulatory Visit: Payer: Medicaid Other | Admitting: Student in an Organized Health Care Education/Training Program

## 2022-03-30 VITALS — BP 110/64 | HR 60 | Temp 97.9°F | Ht 67.0 in | Wt 123.0 lb

## 2022-03-30 DIAGNOSIS — R197 Diarrhea, unspecified: Secondary | ICD-10-CM | POA: Insufficient documentation

## 2022-03-30 DIAGNOSIS — R918 Other nonspecific abnormal finding of lung field: Secondary | ICD-10-CM | POA: Insufficient documentation

## 2022-03-30 DIAGNOSIS — R911 Solitary pulmonary nodule: Secondary | ICD-10-CM

## 2022-03-30 DIAGNOSIS — G620 Drug-induced polyneuropathy: Secondary | ICD-10-CM | POA: Insufficient documentation

## 2022-03-30 DIAGNOSIS — T451X5A Adverse effect of antineoplastic and immunosuppressive drugs, initial encounter: Secondary | ICD-10-CM | POA: Insufficient documentation

## 2022-03-30 DIAGNOSIS — C20 Malignant neoplasm of rectum: Secondary | ICD-10-CM | POA: Diagnosis not present

## 2022-03-30 DIAGNOSIS — Z85048 Personal history of other malignant neoplasm of rectum, rectosigmoid junction, and anus: Secondary | ICD-10-CM | POA: Insufficient documentation

## 2022-03-30 DIAGNOSIS — F419 Anxiety disorder, unspecified: Secondary | ICD-10-CM | POA: Insufficient documentation
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
## 2022-04-04 ENCOUNTER — Other Ambulatory Visit (HOSPITAL_COMMUNITY): Payer: Self-pay

## 2022-04-10 ENCOUNTER — Encounter: Payer: Self-pay | Admitting: Student in an Organized Health Care Education/Training Program

## 2022-04-10 ENCOUNTER — Other Ambulatory Visit: Payer: Self-pay | Admitting: Student in an Organized Health Care Education/Training Program

## 2022-04-10 DIAGNOSIS — R911 Solitary pulmonary nodule: Secondary | ICD-10-CM

## 2022-04-10 NOTE — Progress Notes (Signed)
Ct has been scheduled and letter mailed to patient with appt information

## 2022-04-18 ENCOUNTER — Inpatient Hospital Stay: Payer: Medicaid Other

## 2022-04-18 ENCOUNTER — Ambulatory Visit: Payer: Medicaid Other

## 2022-04-18 NOTE — Progress Notes (Signed)
Patient called to give pre-op instructions. Pt states he has no way to get to the hospital tomorrow and he will need to re-schedule his surgery. Pt given office number for Dr. Genia Harold. Pt states he will call his surgeon's office to re-schedule.

## 2022-04-19 ENCOUNTER — Ambulatory Visit (HOSPITAL_COMMUNITY)
Admission: RE | Admit: 2022-04-19 | Payer: Medicaid Other | Source: Home / Self Care | Admitting: Student in an Organized Health Care Education/Training Program

## 2022-04-19 SURGERY — BRONCHOSCOPY, WITH BIOPSY USING ELECTROMAGNETIC NAVIGATION
Anesthesia: General | Laterality: Bilateral

## 2022-04-19 SURGERY — BRONCHOSCOPY, WITH BIOPSY USING ELECTROMAGNETIC NAVIGATION
Anesthesia: General

## 2022-04-21 ENCOUNTER — Other Ambulatory Visit: Payer: Medicaid Other

## 2022-04-21 ENCOUNTER — Ambulatory Visit: Payer: Medicaid Other | Admitting: Internal Medicine

## 2022-04-26 ENCOUNTER — Inpatient Hospital Stay: Payer: Medicaid Other

## 2022-04-26 ENCOUNTER — Inpatient Hospital Stay (HOSPITAL_BASED_OUTPATIENT_CLINIC_OR_DEPARTMENT_OTHER): Payer: Medicaid Other | Admitting: Internal Medicine

## 2022-04-26 ENCOUNTER — Encounter: Payer: Self-pay | Admitting: *Deleted

## 2022-04-26 ENCOUNTER — Encounter: Payer: Self-pay | Admitting: Internal Medicine

## 2022-04-26 ENCOUNTER — Telehealth: Payer: Self-pay

## 2022-04-26 VITALS — BP 129/78 | HR 64 | Temp 97.4°F | Resp 18 | Wt 121.0 lb

## 2022-04-26 DIAGNOSIS — F419 Anxiety disorder, unspecified: Secondary | ICD-10-CM | POA: Diagnosis not present

## 2022-04-26 DIAGNOSIS — Z95828 Presence of other vascular implants and grafts: Secondary | ICD-10-CM

## 2022-04-26 DIAGNOSIS — C20 Malignant neoplasm of rectum: Secondary | ICD-10-CM

## 2022-04-26 DIAGNOSIS — G620 Drug-induced polyneuropathy: Secondary | ICD-10-CM | POA: Diagnosis not present

## 2022-04-26 DIAGNOSIS — R918 Other nonspecific abnormal finding of lung field: Secondary | ICD-10-CM | POA: Diagnosis not present

## 2022-04-26 DIAGNOSIS — T451X5A Adverse effect of antineoplastic and immunosuppressive drugs, initial encounter: Secondary | ICD-10-CM | POA: Diagnosis not present

## 2022-04-26 DIAGNOSIS — R197 Diarrhea, unspecified: Secondary | ICD-10-CM | POA: Diagnosis not present

## 2022-04-26 DIAGNOSIS — Z85048 Personal history of other malignant neoplasm of rectum, rectosigmoid junction, and anus: Secondary | ICD-10-CM | POA: Diagnosis present

## 2022-04-26 LAB — CBC WITH DIFFERENTIAL/PLATELET
Abs Immature Granulocytes: 0.02 10*3/uL (ref 0.00–0.07)
Basophils Absolute: 0 10*3/uL (ref 0.0–0.1)
Basophils Relative: 1 %
Eosinophils Absolute: 0.2 10*3/uL (ref 0.0–0.5)
Eosinophils Relative: 3 %
HCT: 34.9 % — ABNORMAL LOW (ref 39.0–52.0)
Hemoglobin: 12 g/dL — ABNORMAL LOW (ref 13.0–17.0)
Immature Granulocytes: 0 %
Lymphocytes Relative: 23 %
Lymphs Abs: 1.3 10*3/uL (ref 0.7–4.0)
MCH: 31.8 pg (ref 26.0–34.0)
MCHC: 34.4 g/dL (ref 30.0–36.0)
MCV: 92.6 fL (ref 80.0–100.0)
Monocytes Absolute: 0.5 10*3/uL (ref 0.1–1.0)
Monocytes Relative: 9 %
Neutro Abs: 3.7 10*3/uL (ref 1.7–7.7)
Neutrophils Relative %: 64 %
Platelets: 177 10*3/uL (ref 150–400)
RBC: 3.77 MIL/uL — ABNORMAL LOW (ref 4.22–5.81)
RDW: 13.4 % (ref 11.5–15.5)
WBC: 5.7 10*3/uL (ref 4.0–10.5)
nRBC: 0 % (ref 0.0–0.2)

## 2022-04-26 LAB — COMPREHENSIVE METABOLIC PANEL
ALT: 13 U/L (ref 0–44)
AST: 26 U/L (ref 15–41)
Albumin: 4.1 g/dL (ref 3.5–5.0)
Alkaline Phosphatase: 85 U/L (ref 38–126)
Anion gap: 9 (ref 5–15)
BUN: 11 mg/dL (ref 8–23)
CO2: 25 mmol/L (ref 22–32)
Calcium: 8.9 mg/dL (ref 8.9–10.3)
Chloride: 103 mmol/L (ref 98–111)
Creatinine, Ser: 0.85 mg/dL (ref 0.61–1.24)
GFR, Estimated: >60 mL/min (ref >60)
Glucose, Bld: 123 mg/dL — ABNORMAL HIGH (ref 70–99)
Potassium: 3.5 mmol/L (ref 3.5–5.1)
Sodium: 137 mmol/L (ref 135–145)
Total Bilirubin: 0.6 mg/dL (ref 0.3–1.2)
Total Protein: 6.9 g/dL (ref 6.5–8.1)

## 2022-04-26 MED ORDER — SODIUM CHLORIDE 0.9% FLUSH
10.0000 mL | Freq: Once | INTRAVENOUS | Status: AC
  Administered 2022-04-26: 10 mL via INTRAVENOUS
  Filled 2022-04-26: qty 10

## 2022-04-26 MED ORDER — HEPARIN SOD (PORK) LOCK FLUSH 100 UNIT/ML IV SOLN
500.0000 [IU] | Freq: Once | INTRAVENOUS | Status: AC
  Administered 2022-04-26: 500 [IU] via INTRAVENOUS
  Filled 2022-04-26: qty 5

## 2022-04-26 NOTE — Telephone Encounter (Signed)
Scheduling request for port removal with IR faxed to specialty scheduling.
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
## 2022-04-26 NOTE — Progress Notes (Signed)
Met with patient during follow up visit with Dr. Rogue Bussing to discuss management of suspicious lung nodule. All questions answered during visit. Reviewed upcoming appt to see Dr. Baruch Gouty to discuss radiation treatments. Pt informed that I will call him with his appt for port removal once scheduled. Nothing further needed at this time. Instructed pt to call with any questions or needs. Pt verbalized understanding.

## 2022-04-26 NOTE — Progress Notes (Signed)
Pt reports rectal pain is getting better. He takes laxatives to promote liquid stools. Solid stools are very pain ful for him. Appetite is good. Energy is up and down. Pt canceled his bronchoscopy biopsy because he did not have transportation to [place]. Pt is very concerned because he has new nodules under both axillary areas, these are tender to touch.The only medication he is on is Flomax which he does not take every day. Admits to occasional marijuana joint for his anxiety and pain.

## 2022-04-26 NOTE — Assessment & Plan Note (Addendum)
#  OCT 2022-Rectal cancer-  locally advanced disease. MRI- OCT 2022- T4bN2; ? M [see below]; JULY 2023-  S/p Low anterior resection with hand-sewn coloanal anastomosis, DLI on 10/03/21 by Dr. Maxie Better; ypT2ypN0.  PET DEC 2023-  Similar rectal wall thickening with perirectal fluid/soft tissue which is minimally metabolic and favored reflect posttreatment change.  No evidence of any recurrent disease.  # PET 2022- LEFT lower lobe nodule with increased metabolic activity suspicious for either metastatic colorectal neoplasm or second primary.  DEC 1914- Hypermetabolic 11 mm solid left upper lobe pulmonary nodule is suspicious for primary bronchogenic neoplasm versus metastatic disease.  Patient missed appointment with pulmonary for biopsy because of transportation issues.   # Given the overall patient's performance status/difficulties with transportation keeping appointments-I think patient is a poor candidate for any surgical resection.  Recommend SBRT.  Make a referral to Dr. Donella Stade.  # pain from surgery [facility] 10th, 2023]-continues to complain of chronic pain.  Given history of narcotic abuse/would not refill any narcotic pain medication.  Offered referral to pain clinic patient declined. Stable.   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  Discussed with [employer].Stable.   # IV Access: port- functional; # port/IV access- Stable; discussed re: pro and cons of keeping the port vs. Explantation.      Lucianne Lei pt  # DISPOSITION: # referral to IR re: port explanatation # referral to Dr.Chrystal re: Hx of rectal cancer/ Left upper lung nodule #  Follow up  in 2 months- MD; labs/port - cbc/cmp;CEA-  ;Dr.B  # I reviewed the blood work- with the patient in detail; also reviewed the imaging independently [as summarized above]; and with the patient in detail.

## 2022-04-27 ENCOUNTER — Encounter: Payer: Self-pay | Admitting: Internal Medicine

## 2022-04-27 LAB — CEA: CEA: 4.6 ng/mL (ref 0.0–4.7)

## 2022-04-27 NOTE — Telephone Encounter (Signed)
pt is scheduled on 05/02/22.  Marylen Ponto, RN to notify patient.

## 2022-05-01 ENCOUNTER — Other Ambulatory Visit: Payer: Self-pay | Admitting: Internal Medicine

## 2022-05-01 DIAGNOSIS — C20 Malignant neoplasm of rectum: Secondary | ICD-10-CM

## 2022-05-01 NOTE — Progress Notes (Signed)
Patient for IR Port Removal on Tues 05/02/2022, I called and spoke with the patient on the phone and gave pre-procedure instructions. Pt was made aware to be here at 9:30a at the new entrance. Pt stated understanding. Called 05/01/2022
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
## 2022-05-02 ENCOUNTER — Ambulatory Visit
Admission: RE | Admit: 2022-05-02 | Discharge: 2022-05-02 | Disposition: A | Discharge status: Home / Self Care | Payer: Medicaid Other | Source: Ambulatory Visit | Attending: Internal Medicine | Admitting: Internal Medicine

## 2022-05-02 DIAGNOSIS — Z452 Encounter for adjustment and management of vascular access device: Secondary | ICD-10-CM | POA: Diagnosis present

## 2022-05-02 DIAGNOSIS — Z87891 Personal history of nicotine dependence: Secondary | ICD-10-CM | POA: Diagnosis not present

## 2022-05-02 DIAGNOSIS — Z85048 Personal history of other malignant neoplasm of rectum, rectosigmoid junction, and anus: Secondary | ICD-10-CM | POA: Insufficient documentation

## 2022-05-02 DIAGNOSIS — C20 Malignant neoplasm of rectum: Secondary | ICD-10-CM

## 2022-05-02 HISTORY — PX: IR REMOVAL TUN ACCESS W/ PORT W/O FL MOD SED: IMG2290

## 2022-05-02 MED ORDER — LIDOCAINE-EPINEPHRINE 1 %-1:100000 IJ SOLN
INTRAMUSCULAR | Status: AC
  Administered 2022-05-02: 5 mL
  Filled 2022-05-02: qty 1

## 2022-05-03 ENCOUNTER — Inpatient Hospital Stay: Payer: Medicaid Other | Attending: Radiation Oncology

## 2022-05-03 ENCOUNTER — Ambulatory Visit
Admission: RE | Admit: 2022-05-03 | Discharge: 2022-05-03 | Disposition: A | Discharge status: Home / Self Care | Payer: Medicaid Other | Source: Ambulatory Visit | Attending: Radiation Oncology | Admitting: Radiation Oncology

## 2022-05-03 ENCOUNTER — Encounter: Payer: Self-pay | Admitting: *Deleted

## 2022-05-03 ENCOUNTER — Encounter: Payer: Self-pay | Admitting: Radiation Oncology

## 2022-05-03 VITALS — BP 112/71 | HR 58 | Temp 97.4°F | Resp 16 | Ht 67.0 in | Wt 124.7 lb

## 2022-05-03 DIAGNOSIS — Z85048 Personal history of other malignant neoplasm of rectum, rectosigmoid junction, and anus: Secondary | ICD-10-CM | POA: Diagnosis not present

## 2022-05-03 DIAGNOSIS — R911 Solitary pulmonary nodule: Secondary | ICD-10-CM | POA: Insufficient documentation

## 2022-05-03 DIAGNOSIS — Z510 Encounter for antineoplastic radiation therapy: Secondary | ICD-10-CM | POA: Insufficient documentation

## 2022-05-03 NOTE — Progress Notes (Signed)
Radiation Oncology Follow up Note old patient new area lung mass  Name: [name] [name]   Date:   05/03/2022 MRN:  [id number] DOB: 10/23/1960    This 62 y.o. male presents to the clinic today for evaluation of a posterior left upper lobe hypermetabolic mass consistent with primary stage I bronchogenic carcinoma and patient previously treated.  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  Postoperatively his tumor was staged pT2 pN0 M0) he has done well as well although continues to have some lower abdominal pain where his colostomy was.  He is recently had.  Reversal of his colostomy.  They have been tracking a left upper lobe lung nodule.  This has been progressing in size over time and is also hypermetabolic on PET CT scan.  Patient's had multiple appointments for consideration of navigational bronchoscopy although he declines that at this time.  He specifically denies any pulmonary symptoms specifically denies any change in pulmonary status cough hemoptysis or chest tightness.  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  Well-developed well-nourished patient in NAD. HEENT reveals PERLA, EOMI, discs not visualized.  Oral cavity is clear. No oral mucosal lesions are identified. Neck is clear without evidence of cervical or  supraclavicular adenopathy. Lungs are clear to A&P. Cardiac examination is essentially unremarkable with regular rate and rhythm without murmur rub or thrill. Abdomen is benign with no organomegaly or masses noted. Motor sensory and DTR levels are equal and symmetric in the upper and lower extremities. Cranial nerves II through XII are grossly intact. Proprioception is intact. No peripheral adenopathy or edema is identified. No motor or sensory levels are noted. Crude visual fields are within normal range.  RADIOLOGY RESULTS: CT scans and PET CT scans reviewed compatible with above-stated findings PET CT scan shows no evidence of disease in his pelvic region.  PLAN: This time I have offered SBRT to his left upper lobe lung nodule.  Will plan on delivering 60 Gray in 5 fractions.  Would use for dimensional treatment planning as well as motion restriction for treatment planning.  Risks and benefits and extremely low side effect profile for side effects for SBRT were explained to the patient.  He again is declining any navigational bronchoscopy or biopsy at this time.  I have personally set up and ordered CT simulation.  Patient comprehends my recommendations well.  I would like to take this opportunity to thank you for allowing me to participate in the care of your patient.Noreene Filbert, MD

## 2022-05-03 NOTE — Addendum Note (Signed)
Encounter addended by: Manus Rudd, RN on: 05/03/2022 3:35 PM  Actions taken: Flowsheet accepted

## 2022-05-09 ENCOUNTER — Ambulatory Visit
Admission: RE | Admit: 2022-05-09 | Discharge: 2022-05-09 | Disposition: A | Discharge status: Home / Self Care | Payer: Medicaid Other | Source: Ambulatory Visit | Attending: Radiation Oncology | Admitting: Radiation Oncology

## 2022-05-09 ENCOUNTER — Encounter: Payer: Self-pay | Admitting: *Deleted

## 2022-05-09 ENCOUNTER — Inpatient Hospital Stay: Payer: Medicaid Other

## 2022-05-09 DIAGNOSIS — Z510 Encounter for antineoplastic radiation therapy: Secondary | ICD-10-CM | POA: Diagnosis not present

## 2022-05-09 NOTE — Progress Notes (Signed)
Met with patient during CT simulation appt today. All questions answered during visit. Reviewed upcoming appts. Instructed to call with any questions or needs. Pt verbalized understanding.

## 2022-05-11 DIAGNOSIS — Z510 Encounter for antineoplastic radiation therapy: Secondary | ICD-10-CM | POA: Diagnosis not present

## 2022-05-16 ENCOUNTER — Inpatient Hospital Stay: Payer: Medicaid Other

## 2022-05-16 NOTE — Progress Notes (Signed)
Nutrition Follow-up:  Patient with locally advanced rectal cancer s/p chemotherapy and radiation, s/p low anterior resection.  Noted recent PET new lung nodule.  Planning SBRT  Spoke with patient via phone for nutrition follow-up.  Patient reports that his appetite is better. He is still having trouble with constipation.  Says that he has not received any boost shakes for several months.  He was told that he needed to talk with his doctor about them.  Reports that he feels better when drinking them.  Does not usually eat much breakfast.  Tends to eat more at lunch and supper.      Medications: reviewed  Labs: reviewed from 1/31  Anthropometrics:   Weight 124 lb 11.2 oz on 05/03/22 BMI: 19  119 lb 7.2 oz on 12/15 116 lb on 11/2 at [facility] 124 lb on 07/27/21   NUTRITION DIAGNOSIS: Inadequate oral intake improving   INTERVENTION:  Recommend restarting boost plus (chocolate) shakes 2-3 times per day for added nutrition with BMI of 18 and starting radiation for new lung nodule. RD will reach out to [place]. Encouraged patient to continue bowel regimen for constipation Encouraged patient to continue to consume high calorie, high protein foods to maintain weight    MONITORING, EVALUATION, GOAL: weight trends, intake   NEXT VISIT: as needed  Shannin Naab B. Zenia Resides, [place], [place] Registered Dietitian [phone]

## 2022-05-18 NOTE — Progress Notes (Signed)
Nutrition  Spoke with patient on the phone and informed that [employer] boost plus shakes are on back order and they will send substitute product of boost VHC BID until able to get boost plus back in stock. Patient verbalized understanding.    Ismael Treptow B. Zenia Resides, [place], [place] Registered Dietitian [phone]

## 2022-05-23 ENCOUNTER — Encounter: Payer: Self-pay | Admitting: *Deleted

## 2022-05-23 ENCOUNTER — Ambulatory Visit
Admission: RE | Admit: 2022-05-23 | Discharge: 2022-05-23 | Disposition: A | Discharge status: Home / Self Care | Payer: Medicaid Other | Source: Ambulatory Visit | Attending: Radiation Oncology | Admitting: Radiation Oncology

## 2022-05-23 ENCOUNTER — Inpatient Hospital Stay (HOSPITAL_BASED_OUTPATIENT_CLINIC_OR_DEPARTMENT_OTHER): Payer: Medicaid Other | Admitting: Nurse Practitioner

## 2022-05-23 ENCOUNTER — Inpatient Hospital Stay: Payer: Medicaid Other

## 2022-05-23 ENCOUNTER — Other Ambulatory Visit: Payer: Self-pay

## 2022-05-23 DIAGNOSIS — Z510 Encounter for antineoplastic radiation therapy: Secondary | ICD-10-CM | POA: Diagnosis not present

## 2022-05-23 DIAGNOSIS — C20 Malignant neoplasm of rectum: Secondary | ICD-10-CM

## 2022-05-23 LAB — RAD ONC ARIA SESSION SUMMARY
Course Elapsed Days: 0
Plan Fractions Treated to Date: 1
Plan Prescribed Dose Per Fraction: 12 Gy
Plan Total Fractions Prescribed: 5
Plan Total Prescribed Dose: 60 Gy
Reference Point Dosage Given to Date: 12 Gy
Reference Point Session Dosage Given: 12 Gy
Session Number: 1

## 2022-05-23 NOTE — Progress Notes (Signed)
Patient complained of 'stomach issues' this morning and requested evaluation. I phoned patient for telephone visit x 2 but no answer and unable to leave VM. I've asked scheduling to contact him to reschedule.

## 2022-05-24 ENCOUNTER — Telehealth: Payer: Self-pay | Admitting: Internal Medicine

## 2022-05-24 NOTE — Telephone Encounter (Signed)
I called this patient to reschedule his missed telephone appointment from yesterday. He is very emotional and requested an appointment with a dr tomorrow. He said he doesn't want to talk on the phone. Please advise on schedule. Thank you

## 2022-05-24 NOTE — Telephone Encounter (Signed)
Error

## 2022-05-25 ENCOUNTER — Encounter: Payer: Self-pay | Admitting: Nurse Practitioner

## 2022-05-25 ENCOUNTER — Inpatient Hospital Stay (HOSPITAL_BASED_OUTPATIENT_CLINIC_OR_DEPARTMENT_OTHER): Payer: Medicaid Other | Admitting: Nurse Practitioner

## 2022-05-25 ENCOUNTER — Other Ambulatory Visit: Payer: Self-pay

## 2022-05-25 ENCOUNTER — Ambulatory Visit
Admission: RE | Admit: 2022-05-25 | Discharge: 2022-05-25 | Disposition: A | Discharge status: Home / Self Care | Payer: Medicaid Other | Source: Ambulatory Visit | Attending: Radiation Oncology | Admitting: Radiation Oncology

## 2022-05-25 ENCOUNTER — Encounter: Payer: Self-pay | Admitting: *Deleted

## 2022-05-25 ENCOUNTER — Telehealth: Payer: Self-pay | Admitting: *Deleted

## 2022-05-25 ENCOUNTER — Inpatient Hospital Stay: Payer: Medicaid Other

## 2022-05-25 VITALS — BP 129/89 | HR 81 | Temp 97.1°F | Resp 18 | Ht 67.0 in | Wt 119.0 lb

## 2022-05-25 DIAGNOSIS — R918 Other nonspecific abnormal finding of lung field: Secondary | ICD-10-CM | POA: Diagnosis not present

## 2022-05-25 DIAGNOSIS — K5909 Other constipation: Secondary | ICD-10-CM | POA: Diagnosis not present

## 2022-05-25 DIAGNOSIS — Z87891 Personal history of nicotine dependence: Secondary | ICD-10-CM | POA: Diagnosis not present

## 2022-05-25 DIAGNOSIS — Z85048 Personal history of other malignant neoplasm of rectum, rectosigmoid junction, and anus: Secondary | ICD-10-CM | POA: Diagnosis present

## 2022-05-25 LAB — RAD ONC ARIA SESSION SUMMARY
Course Elapsed Days: 2
Plan Fractions Treated to Date: 2
Plan Prescribed Dose Per Fraction: 12 Gy
Plan Total Fractions Prescribed: 5
Plan Total Prescribed Dose: 60 Gy
Reference Point Dosage Given to Date: 24 Gy
Reference Point Session Dosage Given: 12 Gy
Session Number: 2

## 2022-05-25 MED ORDER — LINACLOTIDE 145 MCG PO CAPS: 145.0000 ug | ORAL_CAPSULE | Freq: Every day | ORAL | 1 refills | Status: DC

## 2022-05-25 NOTE — Patient Instructions (Signed)
Medication is not likely to be covered by your insurance. You can pay out of pocket if you'd like to try it. Otherwise, increase intake and regular use of senna and miralax. Increase intake of fluids and fiber. I can refer you back to GI if you'd like.

## 2022-05-25 NOTE — Telephone Encounter (Signed)
Linzess comes in bottle of 30 and cannot be broken into smaller doses ts the #10 plus 1 refill needs to be changed to disp # 30 Please
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
## 2022-05-26 ENCOUNTER — Encounter: Payer: Self-pay | Admitting: Internal Medicine

## 2022-05-26 MED ORDER — LINACLOTIDE 145 MCG PO CAPS: 145.0000 ug | ORAL_CAPSULE | Freq: Every day | ORAL | 0 refills | Status: DC

## 2022-05-30 ENCOUNTER — Encounter: Payer: Self-pay | Admitting: *Deleted

## 2022-05-30 ENCOUNTER — Ambulatory Visit
Admission: RE | Admit: 2022-05-30 | Discharge: 2022-05-30 | Disposition: A | Discharge status: Home / Self Care | Payer: Medicaid Other | Source: Ambulatory Visit | Attending: Radiation Oncology | Admitting: Radiation Oncology

## 2022-05-30 ENCOUNTER — Other Ambulatory Visit: Payer: Self-pay

## 2022-05-30 ENCOUNTER — Inpatient Hospital Stay: Payer: Medicaid Other | Attending: Radiation Oncology

## 2022-05-30 DIAGNOSIS — Z85048 Personal history of other malignant neoplasm of rectum, rectosigmoid junction, and anus: Secondary | ICD-10-CM | POA: Insufficient documentation

## 2022-05-30 DIAGNOSIS — R911 Solitary pulmonary nodule: Secondary | ICD-10-CM | POA: Diagnosis present

## 2022-05-30 DIAGNOSIS — Z510 Encounter for antineoplastic radiation therapy: Secondary | ICD-10-CM | POA: Diagnosis present

## 2022-05-30 LAB — RAD ONC ARIA SESSION SUMMARY
Course Elapsed Days: 7
Plan Fractions Treated to Date: 3
Plan Prescribed Dose Per Fraction: 12 Gy
Plan Total Fractions Prescribed: 5
Plan Total Prescribed Dose: 60 Gy
Reference Point Dosage Given to Date: 36 Gy
Reference Point Session Dosage Given: 12 Gy
Session Number: 3

## 2022-06-01 ENCOUNTER — Other Ambulatory Visit: Payer: Self-pay

## 2022-06-01 ENCOUNTER — Encounter: Payer: Self-pay | Admitting: *Deleted

## 2022-06-01 ENCOUNTER — Inpatient Hospital Stay: Payer: Medicaid Other

## 2022-06-01 ENCOUNTER — Ambulatory Visit
Admission: RE | Admit: 2022-06-01 | Discharge: 2022-06-01 | Disposition: A | Discharge status: Home / Self Care | Payer: Medicaid Other | Source: Ambulatory Visit | Attending: Radiation Oncology | Admitting: Radiation Oncology

## 2022-06-01 DIAGNOSIS — Z510 Encounter for antineoplastic radiation therapy: Secondary | ICD-10-CM | POA: Diagnosis not present

## 2022-06-01 LAB — RAD ONC ARIA SESSION SUMMARY
Course Elapsed Days: 9
Plan Fractions Treated to Date: 4
Plan Prescribed Dose Per Fraction: 12 Gy
Plan Total Fractions Prescribed: 5
Plan Total Prescribed Dose: 60 Gy
Reference Point Dosage Given to Date: 48 Gy
Reference Point Session Dosage Given: 12 Gy
Session Number: 4

## 2022-06-06 ENCOUNTER — Inpatient Hospital Stay: Payer: Medicaid Other

## 2022-06-06 ENCOUNTER — Ambulatory Visit
Admission: RE | Admit: 2022-06-06 | Discharge: 2022-06-06 | Disposition: A | Discharge status: Home / Self Care | Payer: Medicaid Other | Source: Ambulatory Visit | Attending: Radiation Oncology | Admitting: Radiation Oncology

## 2022-06-06 ENCOUNTER — Encounter: Payer: Self-pay | Admitting: *Deleted

## 2022-06-06 ENCOUNTER — Other Ambulatory Visit: Payer: Self-pay

## 2022-06-06 DIAGNOSIS — Z510 Encounter for antineoplastic radiation therapy: Secondary | ICD-10-CM | POA: Diagnosis not present

## 2022-06-06 LAB — RAD ONC ARIA SESSION SUMMARY
Course Elapsed Days: 14
Plan Fractions Treated to Date: 5
Plan Prescribed Dose Per Fraction: 12 Gy
Plan Total Fractions Prescribed: 5
Plan Total Prescribed Dose: 60 Gy
Reference Point Dosage Given to Date: 60 Gy
Reference Point Session Dosage Given: 12 Gy
Session Number: 5

## 2022-06-26 ENCOUNTER — Inpatient Hospital Stay: Payer: Medicaid Other | Admitting: Internal Medicine

## 2022-06-26 ENCOUNTER — Inpatient Hospital Stay: Payer: Medicaid Other

## 2022-07-11 ENCOUNTER — Inpatient Hospital Stay: Payer: Medicaid Other

## 2022-07-11 ENCOUNTER — Inpatient Hospital Stay: Payer: Medicaid Other | Admitting: Internal Medicine

## 2022-07-12 ENCOUNTER — Ambulatory Visit
Admission: RE | Admit: 2022-07-12 | Discharge: 2022-07-12 | Disposition: A | Discharge status: Home / Self Care | Payer: Medicaid Other | Source: Ambulatory Visit | Attending: Radiation Oncology | Admitting: Radiation Oncology

## 2022-07-12 ENCOUNTER — Inpatient Hospital Stay (HOSPITAL_BASED_OUTPATIENT_CLINIC_OR_DEPARTMENT_OTHER): Payer: Medicaid Other | Admitting: Internal Medicine

## 2022-07-12 ENCOUNTER — Inpatient Hospital Stay: Payer: Medicaid Other | Attending: Radiation Oncology

## 2022-07-12 ENCOUNTER — Encounter: Payer: Self-pay | Admitting: Internal Medicine

## 2022-07-12 ENCOUNTER — Inpatient Hospital Stay: Payer: Medicaid Other

## 2022-07-12 ENCOUNTER — Encounter: Payer: Self-pay | Admitting: Radiation Oncology

## 2022-07-12 VITALS — BP 134/75 | HR 49 | Temp 94.0°F | Resp 18 | Ht 67.0 in | Wt 123.0 lb

## 2022-07-12 VITALS — BP 120/75 | HR 52 | Temp 98.0°F | Resp 16 | Ht 67.0 in | Wt 123.7 lb

## 2022-07-12 DIAGNOSIS — F419 Anxiety disorder, unspecified: Secondary | ICD-10-CM | POA: Diagnosis not present

## 2022-07-12 DIAGNOSIS — R911 Solitary pulmonary nodule: Secondary | ICD-10-CM

## 2022-07-12 DIAGNOSIS — G629 Polyneuropathy, unspecified: Secondary | ICD-10-CM | POA: Diagnosis not present

## 2022-07-12 DIAGNOSIS — R197 Diarrhea, unspecified: Secondary | ICD-10-CM | POA: Diagnosis not present

## 2022-07-12 DIAGNOSIS — G8929 Other chronic pain: Secondary | ICD-10-CM | POA: Insufficient documentation

## 2022-07-12 DIAGNOSIS — Z85048 Personal history of other malignant neoplasm of rectum, rectosigmoid junction, and anus: Secondary | ICD-10-CM | POA: Insufficient documentation

## 2022-07-12 DIAGNOSIS — C3412 Malignant neoplasm of upper lobe, left bronchus or lung: Secondary | ICD-10-CM | POA: Diagnosis not present

## 2022-07-12 DIAGNOSIS — C20 Malignant neoplasm of rectum: Secondary | ICD-10-CM

## 2022-07-12 DIAGNOSIS — Z923 Personal history of irradiation: Secondary | ICD-10-CM | POA: Diagnosis not present

## 2022-07-12 DIAGNOSIS — Z87891 Personal history of nicotine dependence: Secondary | ICD-10-CM | POA: Diagnosis not present

## 2022-07-12 LAB — CBC WITH DIFFERENTIAL/PLATELET
Abs Immature Granulocytes: 0.01 10*3/uL (ref 0.00–0.07)
Basophils Absolute: 0 10*3/uL (ref 0.0–0.1)
Basophils Relative: 1 %
Eosinophils Absolute: 0.2 10*3/uL (ref 0.0–0.5)
Eosinophils Relative: 3 %
HCT: 39 % (ref 39.0–52.0)
Hemoglobin: 13.2 g/dL (ref 13.0–17.0)
Immature Granulocytes: 0 %
Lymphocytes Relative: 18 %
Lymphs Abs: 1.1 10*3/uL (ref 0.7–4.0)
MCH: 32 pg (ref 26.0–34.0)
MCHC: 33.8 g/dL (ref 30.0–36.0)
MCV: 94.7 fL (ref 80.0–100.0)
Monocytes Absolute: 0.5 10*3/uL (ref 0.1–1.0)
Monocytes Relative: 8 %
Neutro Abs: 4.3 10*3/uL (ref 1.7–7.7)
Neutrophils Relative %: 70 %
Platelets: 192 10*3/uL (ref 150–400)
RBC: 4.12 MIL/uL — ABNORMAL LOW (ref 4.22–5.81)
RDW: 13 % (ref 11.5–15.5)
WBC: 6 10*3/uL (ref 4.0–10.5)
nRBC: 0 % (ref 0.0–0.2)

## 2022-07-12 LAB — COMPREHENSIVE METABOLIC PANEL
ALT: 13 U/L (ref 0–44)
AST: 21 U/L (ref 15–41)
Albumin: 4.7 g/dL (ref 3.5–5.0)
Alkaline Phosphatase: 85 U/L (ref 38–126)
Anion gap: 6 (ref 5–15)
BUN: 10 mg/dL (ref 8–23)
CO2: 26 mmol/L (ref 22–32)
Calcium: 9.4 mg/dL (ref 8.9–10.3)
Chloride: 107 mmol/L (ref 98–111)
Creatinine, Ser: 0.85 mg/dL (ref 0.61–1.24)
GFR, Estimated: >60 mL/min (ref >60)
Glucose, Bld: 95 mg/dL (ref 70–99)
Potassium: 3.6 mmol/L (ref 3.5–5.1)
Sodium: 139 mmol/L (ref 135–145)
Total Bilirubin: 0.7 mg/dL (ref 0.3–1.2)
Total Protein: 7.5 g/dL (ref 6.5–8.1)

## 2022-07-12 MED ORDER — PANTOPRAZOLE SODIUM 40 MG PO TBEC: 40.0000 mg | DELAYED_RELEASE_TABLET | Freq: Every day | ORAL | 1 refills | Status: DC

## 2022-07-12 MED ORDER — PREGABALIN 75 MG PO CAPS: 75.0000 mg | ORAL_CAPSULE | Freq: Two times a day (BID) | ORAL | 1 refills | Status: DC
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
## 2022-07-12 NOTE — Assessment & Plan Note (Addendum)
#   OCT 2022-Rectal cancer-  locally advanced disease. MRI- OCT 2022- T4bN2; ? M [see below]; JULY 2023-  S/p Low anterior resection with hand-sewn coloanal anastomosis, DLI on 10/03/21 by Dr. Daryl Eastern; ypT2ypN0.  PET DEC 2023-  Similar rectal wall thickening with perirectal fluid/soft tissue which is minimally metabolic and favored reflect posttreatment change.  No evidence of any recurrent disease. Stable.   # DEC 2023- Hypermetabolic 11 mm solid left upper lobe pulmonary nodule is suspicious for primary bronchogenic neoplasm versus metastatic disease [declined biopsy].  S/p SBRT finished March 12th 2024- will repeat CT scan in 2 months- June 2024; ordered today.   # pain from surgery [July 10th, 2023]-continues to complain of chronic pain.  Given history of narcotic abuse/would not refill any narcotic pain medication.  Offered referral to pain clinic patient declined. Stable.   # Peripheral Neuropathy-/joint pain/ and tingling and numbness in hands and feet- worse-  recommend Lyrica 75 mg BID.    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  Discussed with [employer].Stable.   # IV Access: PIV-  s/p . Explantation.      Zenaida Niece pt  # DISPOSITION: #  Follow up  in 2 months- MD; labs; cbc/cmp;CEA; CT scan prior-Dr.B

## 2022-07-12 NOTE — Progress Notes (Signed)
Radiation Oncology Follow up Note  Name: [name] [name]   Date:   07/12/2022 MRN:  [id number] DOB: 1960/07/24    This 62 y.o. male presents to the clinic today for 1 month follow-up status post SBRT to his left upper lobe for stage I bronchogenic carcinoma and patient treated previously with neoadjuvant chemotherapy and radiation for invasive moderately differentiated adenocarcinoma the rectum.  REFERRING PROVIDER: No ref. provider found  HPI: Patient is a 62 year old male now out 1 month having completed SBRT to his left upper lobe for stage I bronchogenic carcinoma.  He is seen today in follow-up and doing well specifically [place] cough hemoptysis or any change in his pulmonary status.  He is also recovering from his rectal cancer.  He does have some chronic rectal pain his diarrhea is stable.  COMPLICATIONS OF TREATMENT: none  FOLLOW UP COMPLIANCE: keeps appointments   PHYSICAL EXAM:  BP 120/75   Pulse (!) 52   Temp 98 F (36.7 C) (Tympanic)   Resp 16   Ht  (1.702 m)   Wt 123 lb 11.2 oz (56.1 kg)   BMI 19.37 kg/m  Well-developed well-nourished patient in NAD. HEENT reveals PERLA, EOMI, discs not visualized.  Oral cavity is clear. No oral mucosal lesions are identified. Neck is clear without evidence of cervical or supraclavicular adenopathy. Lungs are clear to A&P. Cardiac examination is essentially unremarkable with regular rate and rhythm without murmur rub or thrill. Abdomen is benign with no organomegaly or masses noted. Motor sensory and DTR levels are equal and symmetric in the upper and lower extremities. Cranial nerves II through XII are grossly intact. Proprioception is intact. No peripheral adenopathy or edema is identified. No motor or sensory levels are noted. Crude visual fields are within normal range.  RADIOLOGY RESULTS: No current films for review  PLAN: Present time patient is doing well from a clinical standpoint as far as his lung cancer is concerned.  Of asked  to see him back in 3 months with a CT scan of his chest prior to that visit.  Patient knows to call with any concerns.  I would like to take this opportunity to thank you for allowing me to participate in the care of your patient.Carmina Miller, MD

## 2022-07-14 LAB — CEA: CEA: 4.8 ng/mL — ABNORMAL HIGH (ref 0.0–4.7)

## 2022-07-20 ENCOUNTER — Inpatient Hospital Stay: Payer: Medicaid Other

## 2022-07-20 ENCOUNTER — Ambulatory Visit
Admission: RE | Admit: 2022-07-20 | Discharge: 2022-07-20 | Disposition: A | Discharge status: Home / Self Care | Payer: Medicaid Other | Source: Ambulatory Visit | Attending: Internal Medicine | Admitting: Internal Medicine

## 2022-07-20 DIAGNOSIS — R911 Solitary pulmonary nodule: Secondary | ICD-10-CM | POA: Insufficient documentation

## 2022-07-20 DIAGNOSIS — C20 Malignant neoplasm of rectum: Secondary | ICD-10-CM | POA: Diagnosis not present

## 2022-07-20 MED ORDER — IOHEXOL 300 MG/ML  SOLN
100.0000 mL | Freq: Once | INTRAMUSCULAR | Status: AC | PRN
  Administered 2022-07-20: 100 mL via INTRAVENOUS

## 2022-09-13 ENCOUNTER — Encounter: Payer: Self-pay | Admitting: Internal Medicine

## 2022-09-13 ENCOUNTER — Inpatient Hospital Stay: Payer: Medicaid Other | Attending: Radiation Oncology

## 2022-09-13 ENCOUNTER — Inpatient Hospital Stay (HOSPITAL_BASED_OUTPATIENT_CLINIC_OR_DEPARTMENT_OTHER): Payer: Medicaid Other | Admitting: Internal Medicine

## 2022-09-13 ENCOUNTER — Inpatient Hospital Stay: Payer: Medicaid Other

## 2022-09-13 VITALS — BP 121/84 | HR 53 | Temp 96.1°F | Ht 67.0 in | Wt 118.8 lb

## 2022-09-13 DIAGNOSIS — G8929 Other chronic pain: Secondary | ICD-10-CM | POA: Diagnosis not present

## 2022-09-13 DIAGNOSIS — G629 Polyneuropathy, unspecified: Secondary | ICD-10-CM | POA: Diagnosis not present

## 2022-09-13 DIAGNOSIS — Z87891 Personal history of nicotine dependence: Secondary | ICD-10-CM | POA: Diagnosis not present

## 2022-09-13 DIAGNOSIS — C20 Malignant neoplasm of rectum: Secondary | ICD-10-CM

## 2022-09-13 DIAGNOSIS — Z85048 Personal history of other malignant neoplasm of rectum, rectosigmoid junction, and anus: Secondary | ICD-10-CM | POA: Diagnosis present

## 2022-09-13 DIAGNOSIS — R918 Other nonspecific abnormal finding of lung field: Secondary | ICD-10-CM | POA: Insufficient documentation

## 2022-09-13 DIAGNOSIS — F419 Anxiety disorder, unspecified: Secondary | ICD-10-CM | POA: Insufficient documentation

## 2022-09-13 LAB — CMP (CANCER CENTER ONLY)
ALT: 15 U/L (ref 0–44)
AST: 24 U/L (ref 15–41)
Albumin: 4.4 g/dL (ref 3.5–5.0)
Alkaline Phosphatase: 76 U/L (ref 38–126)
Anion gap: 8 (ref 5–15)
BUN: 7 mg/dL — ABNORMAL LOW (ref 8–23)
CO2: 27 mmol/L (ref 22–32)
Calcium: 9.1 mg/dL (ref 8.9–10.3)
Chloride: 104 mmol/L (ref 98–111)
Creatinine: 0.83 mg/dL (ref 0.61–1.24)
GFR, Estimated: >60 mL/min (ref >60)
Glucose, Bld: 92 mg/dL (ref 70–99)
Potassium: 4.8 mmol/L (ref 3.5–5.1)
Sodium: 139 mmol/L (ref 135–145)
Total Bilirubin: 0.6 mg/dL (ref 0.3–1.2)
Total Protein: 7.2 g/dL (ref 6.5–8.1)

## 2022-09-13 LAB — CBC WITH DIFFERENTIAL (CANCER CENTER ONLY)
Abs Immature Granulocytes: 0.02 10*3/uL (ref 0.00–0.07)
Basophils Absolute: 0 10*3/uL (ref 0.0–0.1)
Basophils Relative: 1 %
Eosinophils Absolute: 0.2 10*3/uL (ref 0.0–0.5)
Eosinophils Relative: 4 %
HCT: 37.7 % — ABNORMAL LOW (ref 39.0–52.0)
Hemoglobin: 12.7 g/dL — ABNORMAL LOW (ref 13.0–17.0)
Immature Granulocytes: 0 %
Lymphocytes Relative: 19 %
Lymphs Abs: 1 10*3/uL (ref 0.7–4.0)
MCH: 32.2 pg (ref 26.0–34.0)
MCHC: 33.7 g/dL (ref 30.0–36.0)
MCV: 95.4 fL (ref 80.0–100.0)
Monocytes Absolute: 0.5 10*3/uL (ref 0.1–1.0)
Monocytes Relative: 9 %
Neutro Abs: 3.6 10*3/uL (ref 1.7–7.7)
Neutrophils Relative %: 67 %
Platelet Count: 164 10*3/uL (ref 150–400)
RBC: 3.95 MIL/uL — ABNORMAL LOW (ref 4.22–5.81)
RDW: 13.1 % (ref 11.5–15.5)
WBC Count: 5.4 10*3/uL (ref 4.0–10.5)
nRBC: 0 % (ref 0.0–0.2)

## 2022-09-13 MED ORDER — CLONAZEPAM 1 MG PO TABS: 1.0000 mg | ORAL_TABLET | Freq: Two times a day (BID) | ORAL | 1 refills | Status: DC
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
## 2022-09-13 NOTE — Progress Notes (Signed)
Pt would like to discuss lyrica, he stopped due to side effects.

## 2022-09-13 NOTE — Assessment & Plan Note (Signed)
#   OCT 2022-Rectal cancer-  locally advanced disease. MRI- OCT 2022- T4bN2; ? M [see below]; JULY 2023-  S/p Low anterior resection with hand-sewn coloanal anastomosis, DLI on 10/03/21 by Dr. Daryl Eastern; ypT2ypN0.  PET DEC 2023-  Similar rectal wall thickening with perirectal fluid/soft tissue which is minimally metabolic and favored reflect posttreatment change.  No evidence of any recurrent disease. Stable; will repeat CT scan in July 2024.   # DEC 2023- Hypermetabolic 11 mm solid left upper lobe pulmonary nodule is suspicious for primary bronchogenic neoplasm versus metastatic disease [declined biopsy].  S/p SBRT finished March 12th 2024- will repeat CT scan in July 2024. Ordered today.   # pain from surgery [July 10th, 2023]-continues to complain of chronic pain.  Given history of narcotic abuse/would not refill any narcotic pain medication.  Offered referral to pain clinic patient declined. Stable.   # Peripheral Neuropathy-/joint pain/ and tingling and numbness in hands and feet- stopped sec to Lyrica 75 mg sec to gait issues.   # Anxiety:[Hx of addiction to Xanax] poorly controlled; now agrees to  Klonipin to 1 mg BID-Will refill Klonipin; Declined evaluation with psychiatry.  Discussed with [employer].   # IV Access: PIV-  s/p . Explantation.      Zenaida Niece pt  # DISPOSITION: #  Follow up  in 5 weeks- - MD; labs; cbc/cmp;CEA; CT scan prior-Dr.B

## 2022-09-15 LAB — CEA: CEA: 4.5 ng/mL (ref 0.0–4.7)

## 2022-09-26 ENCOUNTER — Other Ambulatory Visit: Payer: Self-pay | Admitting: *Deleted

## 2022-09-26 MED ORDER — CLONAZEPAM 1 MG PO TABS: 1.0000 mg | ORAL_TABLET | Freq: Two times a day (BID) | ORAL | 0 refills | Status: DC

## 2022-09-26 NOTE — Telephone Encounter (Signed)
Pt called in to request refill of klonopin. Last prescription sent in was for #30 which was a 15 day supply. Pt will be out of medication in the next few days. Dr. Orlie Dakin agreed to refill at this time.

## 2022-10-18 ENCOUNTER — Inpatient Hospital Stay: Payer: Medicaid Other | Attending: Radiation Oncology

## 2022-10-18 ENCOUNTER — Ambulatory Visit
Admission: RE | Admit: 2022-10-18 | Discharge: 2022-10-18 | Disposition: A | Discharge status: Home / Self Care | Payer: Medicaid Other | Source: Ambulatory Visit | Attending: Internal Medicine | Admitting: Internal Medicine

## 2022-10-18 DIAGNOSIS — F419 Anxiety disorder, unspecified: Secondary | ICD-10-CM | POA: Insufficient documentation

## 2022-10-18 DIAGNOSIS — G629 Polyneuropathy, unspecified: Secondary | ICD-10-CM | POA: Insufficient documentation

## 2022-10-18 DIAGNOSIS — G8929 Other chronic pain: Secondary | ICD-10-CM | POA: Insufficient documentation

## 2022-10-18 DIAGNOSIS — C20 Malignant neoplasm of rectum: Secondary | ICD-10-CM | POA: Diagnosis not present

## 2022-10-18 DIAGNOSIS — K5900 Constipation, unspecified: Secondary | ICD-10-CM | POA: Insufficient documentation

## 2022-10-18 DIAGNOSIS — Z85048 Personal history of other malignant neoplasm of rectum, rectosigmoid junction, and anus: Secondary | ICD-10-CM | POA: Insufficient documentation

## 2022-10-18 DIAGNOSIS — M2550 Pain in unspecified joint: Secondary | ICD-10-CM | POA: Insufficient documentation

## 2022-10-18 DIAGNOSIS — Z87891 Personal history of nicotine dependence: Secondary | ICD-10-CM | POA: Insufficient documentation

## 2022-10-18 DIAGNOSIS — R911 Solitary pulmonary nodule: Secondary | ICD-10-CM | POA: Insufficient documentation

## 2022-10-18 MED ORDER — IOHEXOL 300 MG/ML  SOLN
75.0000 mL | Freq: Once | INTRAMUSCULAR | Status: AC | PRN
  Administered 2022-10-18: 75 mL via INTRAVENOUS

## 2022-10-18 MED ORDER — BARIUM SULFATE 2 % PO SUSP
900.0000 mL | Freq: Once | ORAL | Status: AC
  Administered 2022-10-18: 900 mL via ORAL

## 2022-10-25 ENCOUNTER — Inpatient Hospital Stay (HOSPITAL_BASED_OUTPATIENT_CLINIC_OR_DEPARTMENT_OTHER): Payer: Medicaid Other | Admitting: Internal Medicine

## 2022-10-25 ENCOUNTER — Encounter: Payer: Self-pay | Admitting: Internal Medicine

## 2022-10-25 ENCOUNTER — Inpatient Hospital Stay: Payer: Medicaid Other

## 2022-10-25 ENCOUNTER — Other Ambulatory Visit: Payer: Medicaid Other

## 2022-10-25 VITALS — BP 110/68 | HR 60 | Temp 96.2°F | Ht 67.0 in | Wt 117.8 lb

## 2022-10-25 DIAGNOSIS — C20 Malignant neoplasm of rectum: Secondary | ICD-10-CM | POA: Diagnosis not present

## 2022-10-25 DIAGNOSIS — Z85048 Personal history of other malignant neoplasm of rectum, rectosigmoid junction, and anus: Secondary | ICD-10-CM | POA: Diagnosis not present

## 2022-10-25 DIAGNOSIS — F419 Anxiety disorder, unspecified: Secondary | ICD-10-CM | POA: Diagnosis not present

## 2022-10-25 DIAGNOSIS — Z87891 Personal history of nicotine dependence: Secondary | ICD-10-CM | POA: Diagnosis not present

## 2022-10-25 DIAGNOSIS — R911 Solitary pulmonary nodule: Secondary | ICD-10-CM

## 2022-10-25 DIAGNOSIS — K5909 Other constipation: Secondary | ICD-10-CM | POA: Diagnosis not present

## 2022-10-25 DIAGNOSIS — K5900 Constipation, unspecified: Secondary | ICD-10-CM | POA: Diagnosis not present

## 2022-10-25 DIAGNOSIS — M2550 Pain in unspecified joint: Secondary | ICD-10-CM | POA: Diagnosis not present

## 2022-10-25 DIAGNOSIS — G8929 Other chronic pain: Secondary | ICD-10-CM | POA: Diagnosis not present

## 2022-10-25 DIAGNOSIS — G629 Polyneuropathy, unspecified: Secondary | ICD-10-CM | POA: Diagnosis not present

## 2022-10-25 LAB — CMP (CANCER CENTER ONLY)
ALT: 14 U/L (ref 0–44)
AST: 19 U/L (ref 15–41)
Albumin: 4.3 g/dL (ref 3.5–5.0)
Alkaline Phosphatase: 83 U/L (ref 38–126)
Anion gap: 5 (ref 5–15)
BUN: 14 mg/dL (ref 8–23)
CO2: 27 mmol/L (ref 22–32)
Calcium: 8.9 mg/dL (ref 8.9–10.3)
Chloride: 106 mmol/L (ref 98–111)
Creatinine: 0.94 mg/dL (ref 0.61–1.24)
GFR, Estimated: >60 mL/min (ref >60)
Glucose, Bld: 98 mg/dL (ref 70–99)
Potassium: 3.9 mmol/L (ref 3.5–5.1)
Sodium: 138 mmol/L (ref 135–145)
Total Bilirubin: 0.4 mg/dL (ref 0.3–1.2)
Total Protein: 7.2 g/dL (ref 6.5–8.1)

## 2022-10-25 LAB — CBC WITH DIFFERENTIAL (CANCER CENTER ONLY)
Abs Immature Granulocytes: 0.01 10*3/uL (ref 0.00–0.07)
Basophils Absolute: 0 10*3/uL (ref 0.0–0.1)
Basophils Relative: 1 %
Eosinophils Absolute: 0.3 10*3/uL (ref 0.0–0.5)
Eosinophils Relative: 4 %
HCT: 38.9 % — ABNORMAL LOW (ref 39.0–52.0)
Hemoglobin: 13.1 g/dL (ref 13.0–17.0)
Immature Granulocytes: 0 %
Lymphocytes Relative: 19 %
Lymphs Abs: 1.1 10*3/uL (ref 0.7–4.0)
MCH: 31.9 pg (ref 26.0–34.0)
MCHC: 33.7 g/dL (ref 30.0–36.0)
MCV: 94.6 fL (ref 80.0–100.0)
Monocytes Absolute: 0.4 10*3/uL (ref 0.1–1.0)
Monocytes Relative: 8 %
Neutro Abs: 3.9 10*3/uL (ref 1.7–7.7)
Neutrophils Relative %: 68 %
Platelet Count: 165 10*3/uL (ref 150–400)
RBC: 4.11 MIL/uL — ABNORMAL LOW (ref 4.22–5.81)
RDW: 12.9 % (ref 11.5–15.5)
WBC Count: 5.7 10*3/uL (ref 4.0–10.5)
nRBC: 0 % (ref 0.0–0.2)

## 2022-10-25 MED ORDER — CLONAZEPAM 1 MG PO TABS: 1.0000 mg | ORAL_TABLET | Freq: Two times a day (BID) | ORAL | 0 refills | Status: DC

## 2022-10-25 MED ORDER — LINACLOTIDE 145 MCG PO CAPS: 145.0000 ug | ORAL_CAPSULE | Freq: Every day | ORAL | 0 refills | Status: DC

## 2022-10-25 NOTE — Progress Notes (Signed)
C/o dizziness and fainted, checked BP at [facility] 70/60, x2 weeks ago.  Wants to discuss having a procedure at [facility] to stretch his rectum.

## 2022-10-25 NOTE — Assessment & Plan Note (Addendum)
#   OCT 2022-Rectal cancer-  locally advanced disease. MRI- OCT 2022- T4bN2; ? M [see below]; JULY 2023-  S/p Low anterior resection with hand-sewn coloanal anastomosis, DLI on 10/03/21 by Dr. Daryl Eastern; ypT2ypN0.  CT scan in July 25th 2024- Unchanged appearance of the low rectum with mild circumferential wall thickening. No discretely visualized rectal mass. No evidence of lymphadenopathy or other metastatic disease in the chest, abdomen, or pelvis. stable.  # DEC 2023- Hypermetabolic 11 mm solid left upper lobe pulmonary nodule is suspicious for primary bronchogenic neoplasm versus metastatic disease [declined biopsy].  S/p SBRT finished March 12th 2024. CT July 2024- CT stable- Unchanged nodule of the posterior left upper lobe measuring 0.9 x 0.5 cm. New adjacent ground-glass and irregular consolidation, consistent with developing radiation pneumonitis and fibrosis- stable.   # pain from surgery [July 10th, 2023]-continues to complain of chronic pain.  Given history of narcotic abuse/would not refill any narcotic pain medication.  Offered referral to pain clinic patient declined. stable.   # Peripheral Neuropathy-/joint pain/ and tingling and numbness in hands and feet- stopped sec to Lyrica 75 mg sec to gait issues- stable.   # Anxiety:[Hx of addiction to Xanax] poorly controlled; now agrees to  Klonipin to 1 mg BID-Will refill Klonipin; Declined evaluation with psychiatry. Defer to PCP re: psyche evaluation.   # Constipation: will refill linzess.   # IV Access: PIV-  s/p . Explantation.      Zenaida Niece pt  # DISPOSITION: # refer to [facility] re: surveillance colonoscopy/ Hx of rectal cancer #  Follow up in 6 months- MD; labs- cbc/cmp; CEA: CT CAP prior-Dr.B  # I reviewed the blood work- with the patient in detail; also reviewed the imaging independently [as summarized above]; and with the patient in detail.
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
## 2022-11-25 ENCOUNTER — Other Ambulatory Visit: Payer: Self-pay | Admitting: Oncology

## 2022-11-28 ENCOUNTER — Encounter: Payer: Self-pay | Admitting: Internal Medicine

## 2023-01-09 ENCOUNTER — Other Ambulatory Visit: Payer: Self-pay | Admitting: Internal Medicine

## 2023-01-09 DIAGNOSIS — F32A Depression, unspecified: Secondary | ICD-10-CM

## 2023-01-10 ENCOUNTER — Other Ambulatory Visit: Payer: Self-pay | Admitting: *Deleted

## 2023-01-10 ENCOUNTER — Other Ambulatory Visit: Payer: Self-pay

## 2023-01-10 ENCOUNTER — Other Ambulatory Visit: Payer: Self-pay | Admitting: Internal Medicine

## 2023-01-10 DIAGNOSIS — F32A Depression, unspecified: Secondary | ICD-10-CM

## 2023-01-10 DIAGNOSIS — F419 Anxiety disorder, unspecified: Secondary | ICD-10-CM

## 2023-01-10 MED ORDER — CLONAZEPAM 1 MG PO TABS: 1.0000 mg | ORAL_TABLET | Freq: Two times a day (BID) | ORAL | 1 refills | Status: DC

## 2023-01-10 MED ORDER — CLONAZEPAM 1 MG PO TABS
1.0000 mg | ORAL_TABLET | Freq: Two times a day (BID) | ORAL | 1 refills | Status: DC
Start: 2023-01-10 — End: 2023-01-10

## 2023-01-10 MED ORDER — CLONAZEPAM 1 MG PO TABS
1.0000 mg | ORAL_TABLET | Freq: Two times a day (BID) | ORAL | 0 refills | Status: DC
Start: 2023-01-10 — End: 2023-01-10

## 2023-01-10 NOTE — Telephone Encounter (Signed)
Refill request for klonipin

## 2023-02-01 ENCOUNTER — Encounter: Payer: Self-pay | Admitting: Gastroenterology

## 2023-02-19 ENCOUNTER — Encounter: Payer: Self-pay | Admitting: Gastroenterology

## 2023-02-19 ENCOUNTER — Ambulatory Visit: Admission: RE | Admit: 2023-02-19 | Payer: Medicaid Other | Source: Home / Self Care | Admitting: Gastroenterology

## 2023-02-19 ENCOUNTER — Encounter: Payer: Self-pay | Admitting: General Practice

## 2023-02-19 HISTORY — DX: Adjustment disorder, unspecified: F43.20

## 2023-02-19 HISTORY — DX: Malignant neoplasm of rectum: C20

## 2023-02-19 HISTORY — DX: Suicidal ideations: R45.851

## 2023-02-19 HISTORY — DX: Rider (driver) (passenger) of other motorcycle injured in unspecified traffic accident, initial encounter: V29.99XA

## 2023-02-19 SURGERY — COLONOSCOPY WITH PROPOFOL
Anesthesia: General

## 2023-03-13 ENCOUNTER — Other Ambulatory Visit: Payer: Self-pay | Admitting: *Deleted

## 2023-03-13 DIAGNOSIS — K5909 Other constipation: Secondary | ICD-10-CM

## 2023-03-13 DIAGNOSIS — F419 Anxiety disorder, unspecified: Secondary | ICD-10-CM

## 2023-03-13 MED ORDER — CLONAZEPAM 1 MG PO TABS: 1.0000 mg | ORAL_TABLET | Freq: Two times a day (BID) | ORAL | 1 refills | Status: DC

## 2023-03-13 MED ORDER — LINACLOTIDE 145 MCG PO CAPS: 145.0000 ug | ORAL_CAPSULE | Freq: Every day | ORAL | 1 refills | Status: DC

## 2023-04-27 ENCOUNTER — Inpatient Hospital Stay: Payer: Medicaid Other | Attending: Internal Medicine

## 2023-04-27 ENCOUNTER — Ambulatory Visit: Admission: RE | Admit: 2023-04-27 | Payer: Medicaid Other | Source: Ambulatory Visit

## 2023-05-04 ENCOUNTER — Telehealth: Payer: Self-pay | Admitting: Internal Medicine

## 2023-05-04 ENCOUNTER — Inpatient Hospital Stay: Payer: Medicaid Other | Attending: Internal Medicine

## 2023-05-04 ENCOUNTER — Inpatient Hospital Stay: Payer: Medicaid Other

## 2023-05-04 ENCOUNTER — Inpatient Hospital Stay: Payer: Medicaid Other | Admitting: Internal Medicine

## 2023-05-04 ENCOUNTER — Encounter: Payer: Self-pay | Admitting: Internal Medicine

## 2023-05-04 NOTE — Telephone Encounter (Signed)
 Called patient, his mother, and son to see if patient wanted to reschedule appointments- Mr. Kolbeck phone and his mother's phone were both not in service. Left voicemail for his son to have him call back if he wanted to reschedule

## 2023-05-14 ENCOUNTER — Other Ambulatory Visit: Payer: Self-pay | Admitting: *Deleted

## 2023-05-14 DIAGNOSIS — F32A Depression, unspecified: Secondary | ICD-10-CM

## 2023-05-14 MED ORDER — CLONAZEPAM 1 MG PO TABS: 1.0000 mg | ORAL_TABLET | Freq: Two times a day (BID) | ORAL | 1 refills | Status: DC

## 2023-05-14 NOTE — Telephone Encounter (Signed)
 Message left with pt to make him aware that refill has been sent into his pharmacy.

## 2023-05-28 ENCOUNTER — Inpatient Hospital Stay: Payer: Medicaid Other | Attending: Hospice and Palliative Medicine | Admitting: Hospice and Palliative Medicine

## 2023-05-28 DIAGNOSIS — H9192 Unspecified hearing loss, left ear: Secondary | ICD-10-CM | POA: Insufficient documentation

## 2023-05-28 DIAGNOSIS — F32A Depression, unspecified: Secondary | ICD-10-CM

## 2023-05-28 DIAGNOSIS — C20 Malignant neoplasm of rectum: Secondary | ICD-10-CM | POA: Insufficient documentation

## 2023-05-28 DIAGNOSIS — F419 Anxiety disorder, unspecified: Secondary | ICD-10-CM | POA: Insufficient documentation

## 2023-05-28 DIAGNOSIS — G629 Polyneuropathy, unspecified: Secondary | ICD-10-CM | POA: Insufficient documentation

## 2023-05-28 DIAGNOSIS — Z87891 Personal history of nicotine dependence: Secondary | ICD-10-CM | POA: Insufficient documentation

## 2023-05-28 DIAGNOSIS — K5900 Constipation, unspecified: Secondary | ICD-10-CM | POA: Insufficient documentation

## 2023-05-28 MED ORDER — GABAPENTIN 100 MG PO CAPS: 100.0000 mg | ORAL_CAPSULE | Freq: Two times a day (BID) | ORAL | 0 refills | Status: DC

## 2023-05-28 NOTE — Progress Notes (Signed)
 Virtual Visit via Telephone Note  I connected with [name] [name] on 05/28/23 at  3:00 PM EST by telephone and verified that I am speaking with the correct person using two identifiers.  Location: Patient: Home Provider: Clinic   I discussed the limitations, risks, security and privacy concerns of performing an evaluation and management service by telephone and the availability of in person appointments. I also discussed with the patient that there may be a patient responsible charge related to this service. The patient expressed understanding and agreed to proceed.   History of Present Illness: Maxair send is a 63 year old male with multiple medical problems including locally advanced rectal cancer with hypermetabolic left upper lobe pulmonary nodule suspicious for primary bronchogenic cancer versus metastatic disease.  Patient status post SBRT.  Patient is status post anterior resection and coloanal anastomosis.  Status post chemoradiation.   Observations/Objective: Patient was an add-on telephone visit to discuss symptom management.  Patient has complicated history of anxiety/depression with reported intolerance of previously tried SSRIs/SNRIs.  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  Currently on clonazepam for anxiety prescribed by Dr. Donneta Romberg.  Patient has been previously referred to the pain clinic and psychiatry but refused both.  Today, he endorses poorly controlled numbness and tingling/pain in his hands.  Likely neuropathic pain secondary to previous FOLFOX chemotherapy.  I note that he was previously tried on pregabalin but did not tolerate due to gait instability.  Patient tells me that he felt delusional on this medication.  However, patient is interested in trial of gabapentin.  Assessment and Plan: Colorectal cancer -status post previous surgeries/chemoradiation.  Followed by Dr. Donneta Romberg  Anxiety -on clonazepam.  Referral to [facility]/social  work  Peripheral neuropathy -intolerant of pregabalin.  Will trial low-dose gabapentin.  Will avoid opioids given patient's history of SUD. We can refer him to a pain clinic if ineffective.  Follow Up Instructions: Follow-up telephone visit 1 to 2 months   I discussed the assessment and treatment plan with the patient. The patient was provided an opportunity to ask questions and all were answered. The patient agreed with the plan and demonstrated an understanding of the instructions.   The patient was advised to call back or seek an in-person evaluation if the symptoms worsen or if the condition fails to improve as anticipated.  I provided 10 minutes of non-face-to-face time during this encounter.   Malachy Moan, NP

## 2023-05-29 ENCOUNTER — Inpatient Hospital Stay
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
## 2023-06-05 ENCOUNTER — Inpatient Hospital Stay: Payer: Medicaid Other

## 2023-06-05 ENCOUNTER — Inpatient Hospital Stay (HOSPITAL_BASED_OUTPATIENT_CLINIC_OR_DEPARTMENT_OTHER): Payer: Medicaid Other | Admitting: Internal Medicine

## 2023-06-05 ENCOUNTER — Encounter: Payer: Self-pay | Admitting: Internal Medicine

## 2023-06-05 VITALS — BP 109/75 | HR 58 | Temp 97.8°F | Resp 16 | Wt 117.0 lb

## 2023-06-05 DIAGNOSIS — H9192 Unspecified hearing loss, left ear: Secondary | ICD-10-CM

## 2023-06-05 DIAGNOSIS — C20 Malignant neoplasm of rectum: Secondary | ICD-10-CM | POA: Diagnosis not present

## 2023-06-05 DIAGNOSIS — K5900 Constipation, unspecified: Secondary | ICD-10-CM | POA: Diagnosis not present

## 2023-06-05 DIAGNOSIS — G629 Polyneuropathy, unspecified: Secondary | ICD-10-CM | POA: Diagnosis not present

## 2023-06-05 DIAGNOSIS — Z87891 Personal history of nicotine dependence: Secondary | ICD-10-CM | POA: Diagnosis not present

## 2023-06-05 DIAGNOSIS — F419 Anxiety disorder, unspecified: Secondary | ICD-10-CM | POA: Diagnosis not present

## 2023-06-05 LAB — CMP (CANCER CENTER ONLY)
ALT: 18 U/L (ref 0–44)
AST: 29 U/L (ref 15–41)
Albumin: 4.1 g/dL (ref 3.5–5.0)
Alkaline Phosphatase: 64 U/L (ref 38–126)
Anion gap: 7 (ref 5–15)
BUN: 9 mg/dL (ref 8–23)
CO2: 25 mmol/L (ref 22–32)
Calcium: 8.9 mg/dL (ref 8.9–10.3)
Chloride: 102 mmol/L (ref 98–111)
Creatinine: 0.97 mg/dL (ref 0.61–1.24)
GFR, Estimated: >60 mL/min (ref >60)
Glucose, Bld: 91 mg/dL (ref 70–99)
Potassium: 3.9 mmol/L (ref 3.5–5.1)
Sodium: 134 mmol/L — ABNORMAL LOW (ref 135–145)
Total Bilirubin: 0.7 mg/dL (ref 0.0–1.2)
Total Protein: 6.6 g/dL (ref 6.5–8.1)

## 2023-06-05 LAB — CBC WITH DIFFERENTIAL (CANCER CENTER ONLY)
Abs Immature Granulocytes: 0.01 10*3/uL (ref 0.00–0.07)
Basophils Absolute: 0 10*3/uL (ref 0.0–0.1)
Basophils Relative: 1 %
Eosinophils Absolute: 0.1 10*3/uL (ref 0.0–0.5)
Eosinophils Relative: 2 %
HCT: 34.4 % — ABNORMAL LOW (ref 39.0–52.0)
Hemoglobin: 11.8 g/dL — ABNORMAL LOW (ref 13.0–17.0)
Immature Granulocytes: 0 %
Lymphocytes Relative: 22 %
Lymphs Abs: 1.2 10*3/uL (ref 0.7–4.0)
MCH: 33.1 pg (ref 26.0–34.0)
MCHC: 34.3 g/dL (ref 30.0–36.0)
MCV: 96.4 fL (ref 80.0–100.0)
Monocytes Absolute: 0.4 10*3/uL (ref 0.1–1.0)
Monocytes Relative: 7 %
Neutro Abs: 3.7 10*3/uL (ref 1.7–7.7)
Neutrophils Relative %: 68 %
Platelet Count: 185 10*3/uL (ref 150–400)
RBC: 3.57 MIL/uL — ABNORMAL LOW (ref 4.22–5.81)
RDW: 12.2 % (ref 11.5–15.5)
WBC Count: 5.4 10*3/uL (ref 4.0–10.5)
nRBC: 0 % (ref 0.0–0.2)

## 2023-06-05 NOTE — Assessment & Plan Note (Addendum)
#   OCT 2022-Rectal cancer-  locally advanced disease. MRI- OCT 2022- T4bN2; ? M [see below]; JULY 2023-  S/p Low anterior resection with hand-sewn coloanal anastomosis, DLI on 10/03/21 by Dr. Daryl Eastern; ypT2ypN0.  CT scan in July 25th 2024- Unchanged appearance of the low rectum with mild circumferential wall thickening. No discretely visualized rectal mass. No evidence of lymphadenopathy or other metastatic disease in the chest, abdomen, or pelvis. Did not get his CT done.  Patient s/p [facility] evaluation- did not get colonoscopy as planned on 02/19/23. Recommend calling [facility] re: re-scheduling.   # DEC 2023- Hypermetabolic 11 mm solid left upper lobe pulmonary nodule is suspicious for primary bronchogenic neoplasm versus metastatic disease [declined biopsy].  S/p SBRT finished March 12th 2024. CT July 2024- CT stable- Unchanged nodule of the posterior left upper lobe measuring 0.9 x 0.5 cm. New adjacent ground-glass and irregular consolidation, consistent with developing radiation pneumonitis and fibrosis- stable. Await CT scan.   # pain from surgery [July 10th, 2023]-continues to complain of chronic pain.  Given history of narcotic abuse/would not refill any narcotic pain medication.  Offered referral to pain clinic patient declined. stable.   # Peripheral Neuropathy-/joint pain/ and tingling and numbness in hands and feet- stopped sec to Lyrica 75 mg sec to gait issues- stable.   # Anxiety:[Hx of addiction to Xanax] poorly controlled; now agrees to  Klonipin to 1 mg BID-Will refill Klonipin; Declined evaluation with psychiatry.  Stable.   # Left ear hearing loss- refer to [facility]  # Constipation: will refill linzess.   # IV Access: PIV-  s/p . Explantation.      Zenaida Niece pt  # DISPOSITION: # needs to be rescheduled- CT scan CAP -  # Left ear hearing loss- refer to [facility] #  Follow up in 6 months- MD; labs- cbc/cmp; CEA; -Dr.B

## 2023-06-05 NOTE — Progress Notes (Signed)
 Pt was given neurontin 100mg  bid for burning and aching in hands.This is not working for him. He quit taking it. Appetite is good. Rectal pain is better. Takes 1 ex lax per day works for him. Has some nausea in the mornings. Left ear is stopped up, he has 99% loss of hearing, all he can hear is his heart beating. Wonders if he has wax in it. Pt never had his CT scan. No appt  for a CT.
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
## 2023-06-06 LAB — CEA: CEA: 3.7 ng/mL (ref 0.0–4.7)

## 2023-06-11 ENCOUNTER — Ambulatory Visit

## 2023-06-11 ENCOUNTER — Inpatient Hospital Stay

## 2023-06-17 ENCOUNTER — Encounter: Payer: Self-pay | Admitting: Internal Medicine

## 2023-06-17 NOTE — Progress Notes (Signed)
 Patient enrolled in [facility] services on 05/29/23. Patient scheduled for initial behavioral health evaluation on 06/07/23.

## 2023-06-25 ENCOUNTER — Ambulatory Visit
Admission: RE | Admit: 2023-06-25 | Discharge: 2023-06-25 | Disposition: A | Discharge status: Home / Self Care | Source: Ambulatory Visit | Attending: Internal Medicine | Admitting: Internal Medicine

## 2023-06-25 ENCOUNTER — Inpatient Hospital Stay

## 2023-06-25 DIAGNOSIS — C20 Malignant neoplasm of rectum: Secondary | ICD-10-CM | POA: Diagnosis not present

## 2023-06-25 DIAGNOSIS — F419 Anxiety disorder, unspecified: Secondary | ICD-10-CM | POA: Diagnosis not present

## 2023-06-25 DIAGNOSIS — R911 Solitary pulmonary nodule: Secondary | ICD-10-CM | POA: Diagnosis present

## 2023-06-25 DIAGNOSIS — F332 Major depressive disorder, recurrent severe without psychotic features: Secondary | ICD-10-CM | POA: Diagnosis not present

## 2023-06-25 MED ORDER — IOHEXOL 300 MG/ML  SOLN
100.0000 mL | Freq: Once | INTRAMUSCULAR | Status: AC | PRN
  Administered 2023-06-25: 100 mL via INTRAVENOUS

## 2023-07-10 ENCOUNTER — Telehealth: Payer: Self-pay | Admitting: *Deleted

## 2023-07-10 NOTE — Telephone Encounter (Signed)
 I called the pt and no answer and I told him that I do not see a note for any times in April. I did say that the computer does auto messages about appts. His next appt is telephone with Josh on 4/22 at 3:20 and it is telephone  appt, he can call me back if he has issues

## 2023-07-11 ENCOUNTER — Other Ambulatory Visit: Payer: Self-pay | Admitting: *Deleted

## 2023-07-11 DIAGNOSIS — F419 Anxiety disorder, unspecified: Secondary | ICD-10-CM

## 2023-07-11 MED ORDER — CLONAZEPAM 1 MG PO TABS: 1.0000 mg | ORAL_TABLET | Freq: Two times a day (BID) | ORAL | 1 refills | Status: DC

## 2023-07-11 MED ORDER — PANTOPRAZOLE SODIUM 40 MG PO TBEC: 40.0000 mg | DELAYED_RELEASE_TABLET | Freq: Every day | ORAL | 1 refills | Status: DC

## 2023-07-17 ENCOUNTER — Inpatient Hospital Stay: Attending: Hospice and Palliative Medicine | Admitting: Hospice and Palliative Medicine

## 2023-07-17 DIAGNOSIS — C20 Malignant neoplasm of rectum: Secondary | ICD-10-CM

## 2023-07-17 NOTE — Progress Notes (Signed)
 Voicemail left.  Will reschedule

## 2023-07-20 ENCOUNTER — Telehealth: Payer: Self-pay | Admitting: *Deleted

## 2023-07-20 NOTE — Telephone Encounter (Signed)
 Called pt with results from recent CT scan per pt request. Pt did not answer. LVM and instructed to call back with any questions or needs.

## 2023-07-25 DIAGNOSIS — F419 Anxiety disorder, unspecified: Secondary | ICD-10-CM | POA: Diagnosis not present

## 2023-07-25 DIAGNOSIS — C20 Malignant neoplasm of rectum: Secondary | ICD-10-CM | POA: Diagnosis not present

## 2023-07-25 DIAGNOSIS — F332 Major depressive disorder, recurrent severe without psychotic features: Secondary | ICD-10-CM | POA: Diagnosis not present

## 2023-09-11 ENCOUNTER — Other Ambulatory Visit: Payer: Self-pay | Admitting: *Deleted

## 2023-09-11 DIAGNOSIS — F32A Depression, unspecified: Secondary | ICD-10-CM

## 2023-09-11 MED ORDER — CLONAZEPAM 1 MG PO TABS: 1.0000 mg | ORAL_TABLET | Freq: Two times a day (BID) | ORAL | 1 refills | Status: DC

## 2023-09-11 MED ORDER — PANTOPRAZOLE SODIUM 40 MG PO TBEC: 40.0000 mg | DELAYED_RELEASE_TABLET | Freq: Every day | ORAL | 1 refills | Status: AC

## 2023-09-11 NOTE — Telephone Encounter (Signed)
 Pt requesting refill of klonopin  and protonix .

## 2023-09-29 ENCOUNTER — Observation Stay
Admission: EM | Admit: 2023-09-29 | Discharge: 2023-09-30 | Disposition: A | Discharge status: Home / Self Care | Attending: Obstetrics and Gynecology | Admitting: Obstetrics and Gynecology

## 2023-09-29 ENCOUNTER — Emergency Department

## 2023-09-29 ENCOUNTER — Encounter: Payer: Self-pay | Admitting: Internal Medicine

## 2023-09-29 DIAGNOSIS — R0781 Pleurodynia: Secondary | ICD-10-CM | POA: Insufficient documentation

## 2023-09-29 DIAGNOSIS — Z85118 Personal history of other malignant neoplasm of bronchus and lung: Secondary | ICD-10-CM | POA: Diagnosis not present

## 2023-09-29 DIAGNOSIS — R079 Chest pain, unspecified: Secondary | ICD-10-CM | POA: Diagnosis present

## 2023-09-29 DIAGNOSIS — J189 Pneumonia, unspecified organism: Secondary | ICD-10-CM | POA: Diagnosis not present

## 2023-09-29 DIAGNOSIS — Z85048 Personal history of other malignant neoplasm of rectum, rectosigmoid junction, and anus: Secondary | ICD-10-CM | POA: Insufficient documentation

## 2023-09-29 DIAGNOSIS — E43 Unspecified severe protein-calorie malnutrition: Secondary | ICD-10-CM | POA: Diagnosis present

## 2023-09-29 DIAGNOSIS — F411 Generalized anxiety disorder: Secondary | ICD-10-CM | POA: Diagnosis not present

## 2023-09-29 DIAGNOSIS — Z1152 Encounter for screening for COVID-19: Secondary | ICD-10-CM | POA: Insufficient documentation

## 2023-09-29 LAB — BASIC METABOLIC PANEL WITH GFR
Anion gap: 11 (ref 5–15)
BUN: 12 mg/dL (ref 8–23)
CO2: 27 mmol/L (ref 22–32)
Calcium: 9.2 mg/dL (ref 8.9–10.3)
Chloride: 98 mmol/L (ref 98–111)
Creatinine, Ser: 0.99 mg/dL (ref 0.61–1.24)
GFR, Estimated: >60 mL/min (ref >60)
Glucose, Bld: 112 mg/dL — ABNORMAL HIGH (ref 70–99)
Potassium: 3.6 mmol/L (ref 3.5–5.1)
Sodium: 136 mmol/L (ref 135–145)

## 2023-09-29 LAB — CBC
HCT: 38.3 % — ABNORMAL LOW (ref 39.0–52.0)
Hemoglobin: 13.4 g/dL (ref 13.0–17.0)
MCH: 33.3 pg (ref 26.0–34.0)
MCHC: 35 g/dL (ref 30.0–36.0)
MCV: 95 fL (ref 80.0–100.0)
Platelets: 233 K/uL (ref 150–400)
RBC: 4.03 MIL/uL — ABNORMAL LOW (ref 4.22–5.81)
RDW: 12 % (ref 11.5–15.5)
WBC: 9.6 K/uL (ref 4.0–10.5)
nRBC: 0 % (ref 0.0–0.2)

## 2023-09-29 LAB — TROPONIN I (HIGH SENSITIVITY)
Troponin I (High Sensitivity): 6 ng/L (ref <18)
Troponin I (High Sensitivity): 6 ng/L (ref <18)

## 2023-09-29 LAB — RESP PANEL BY RT-PCR (RSV, FLU A&B, COVID)  RVPGX2
Influenza A by PCR: NEGATIVE
Influenza B by PCR: NEGATIVE
Resp Syncytial Virus by PCR: NEGATIVE
SARS Coronavirus 2 by RT PCR: NEGATIVE

## 2023-09-29 LAB — PROCALCITONIN: Procalcitonin: 0.21 ng/mL

## 2023-09-29 LAB — LACTIC ACID, PLASMA: Lactic Acid, Venous: 1.3 mmol/L (ref 0.5–1.9)

## 2023-09-29 MED ORDER — SODIUM CHLORIDE 0.9 % IV SOLN
2.0000 g | Freq: Once | INTRAVENOUS | Status: AC
  Administered 2023-09-29: 2 g via INTRAVENOUS
  Filled 2023-09-29: qty 20

## 2023-09-29 MED ORDER — LACTATED RINGERS IV SOLN: INTRAVENOUS | Status: AC

## 2023-09-29 MED ORDER — MORPHINE SULFATE (PF) 4 MG/ML IV SOLN
4.0000 mg | Freq: Once | INTRAVENOUS | Status: AC
  Administered 2023-09-29: 4 mg via INTRAVENOUS
  Filled 2023-09-29: qty 1

## 2023-09-29 MED ORDER — TAMSULOSIN HCL 0.4 MG PO CAPS
0.4000 mg | ORAL_CAPSULE | Freq: Every day | ORAL | Status: DC
  Administered 2023-09-30: 0.4 mg via ORAL
  Filled 2023-09-29 (×2): qty 1

## 2023-09-29 MED ORDER — ALBUTEROL SULFATE (2.5 MG/3ML) 0.083% IN NEBU: 2.5000 mg | INHALATION_SOLUTION | RESPIRATORY_TRACT | Status: DC | PRN

## 2023-09-29 MED ORDER — OXYCODONE HCL 5 MG PO TABS
5.0000 mg | ORAL_TABLET | ORAL | Status: DC | PRN
  Administered 2023-09-29 – 2023-09-30 (×2): 5 mg via ORAL
  Filled 2023-09-29 (×2): qty 1

## 2023-09-29 MED ORDER — SODIUM CHLORIDE 0.9 % IV SOLN
500.0000 mg | INTRAVENOUS | Status: DC
  Administered 2023-09-30: 500 mg via INTRAVENOUS
  Filled 2023-09-29: qty 5

## 2023-09-29 MED ORDER — SODIUM CHLORIDE 0.9 % IV SOLN
500.0000 mg | Freq: Once | INTRAVENOUS | Status: AC
  Administered 2023-09-29: 500 mg via INTRAVENOUS
  Filled 2023-09-29: qty 5

## 2023-09-29 MED ORDER — PANTOPRAZOLE SODIUM 40 MG PO TBEC
40.0000 mg | DELAYED_RELEASE_TABLET | Freq: Every day | ORAL | Status: DC
  Administered 2023-09-30: 40 mg via ORAL
  Filled 2023-09-29 (×2): qty 1

## 2023-09-29 MED ORDER — IOHEXOL 350 MG/ML SOLN
75.0000 mL | Freq: Once | INTRAVENOUS | Status: AC | PRN
  Administered 2023-09-29: 75 mL via INTRAVENOUS

## 2023-09-29 MED ORDER — CLONAZEPAM 1 MG PO TABS
1.0000 mg | ORAL_TABLET | Freq: Two times a day (BID) | ORAL | Status: DC
  Administered 2023-09-29 – 2023-09-30 (×2): 1 mg via ORAL
  Filled 2023-09-29: qty 1
  Filled 2023-09-29: qty 2

## 2023-09-29 MED ORDER — SODIUM CHLORIDE 0.9 % IV SOLN
2.0000 g | INTRAVENOUS | Status: DC
  Administered 2023-09-30: 2 g via INTRAVENOUS
  Filled 2023-09-29: qty 20

## 2023-09-29 MED ORDER — GUAIFENESIN ER 600 MG PO TB12
1200.0000 mg | ORAL_TABLET | Freq: Two times a day (BID) | ORAL | Status: DC
  Administered 2023-09-30: 1200 mg via ORAL
  Filled 2023-09-29: qty 2

## 2023-09-29 NOTE — Assessment & Plan Note (Signed)
 The patient has had external beam radiation to his chest. CT of the chest performed on admission demonstrates LUL nodule. It will need follow up imaging to rule out recurrent malignancy. There is also enlarged left hilar and AP window lymph nodes that are likely reactive, but will also require follow up imaging to ensure resolution following treatment.

## 2023-09-29 NOTE — Code Documentation (Signed)
 CODE SEPSIS - PHARMACY COMMUNICATION  **Broad Spectrum Antibiotics should be administered within 1 hour of Sepsis diagnosis**  Time Code Sepsis Called/Page Received: 1124  Antibiotics Ordered: ceftriaxone , azithromycin   Time of 1st antibiotic administration: 1201  Additional action taken by pharmacy: none required  If necessary, Name of Provider/Nurse Contacted: N/A    [name] [name] ,PharmD Clinical Pharmacist  09/29/2023  11:33 AM

## 2023-09-29 NOTE — Assessment & Plan Note (Signed)
 The patient states that he takes clonazepam  as a seizure medication. 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 He tells me that he has seizures if he does not take clonazepam . I have continued it as prescribed for him at home.

## 2023-09-29 NOTE — ED Provider Notes (Signed)
 [facility] Provider Note    Event Date/Time   First MD Initiated Contact with Patient 09/29/23 [phone]     (approximate)   History   Chest Pain and Shortness of Breath   HPI  [name] [name] is a 63 y.o. male history of lung nodule/radiation therapy as well as cancer.  3 to 4 days of feeling fatigued cough and slightly ill, then today developing pain he is reports when he takes a deep breath it really hurts over his left chest and has been coughing quite a bit.  No radiating pain.  Follows with Dr. Rennie  No recent hospitalizations.  No abdominal pain.     Physical Exam   Triage Vital Signs: ED Triage Vitals  Encounter Vitals Group     BP 09/29/23 0825 123/86     Girls Systolic BP Percentile --      Girls Diastolic BP Percentile --      Boys Systolic BP Percentile --      Boys Diastolic BP Percentile --      Pulse Rate 09/29/23 0825 67     Resp 09/29/23 0825 18     Temp 09/29/23 0825 98.3 F (36.8 C)     Temp Source 09/29/23 0825 Oral     SpO2 09/29/23 0825 96 %     Weight 09/29/23 0826 117 lb (53.1 kg)     Height 09/29/23 0826 5' 7 (1.702 m)     Head Circumference --      Peak Flow --      Pain Score 09/29/23 0826 9     Pain Loc --      Pain Education --      Exclude from Growth Chart --     Most recent vital signs: Vitals:   09/29/23 1500 09/29/23 1630  BP: 98/61 103/64  Pulse: (!) 53 73  Resp: 16 (!) 25  Temp:    SpO2: 96% 94%     General: Awake, no distress except with obvious pleuritic pain over the left chest with inspiration CV:  Good peripheral perfusion.  Normal tones and rate Resp:  Normal effort no pleuritic pain is notable.  No hypoxia.  Diminished left lung sounds especially left lower lobe mid lungs with but no obvious rales crackles or wheezing.  Right lung clear Abd:  No distention.  Soft nontender Other:  No edema   ED Results / Procedures / Treatments   Labs (all labs ordered are listed, but only  abnormal results are displayed) Labs Reviewed  BASIC METABOLIC PANEL WITH GFR - Abnormal; Notable for the following components:      Result Value   Glucose, Bld 112 (*)    All other components within normal limits  CBC - Abnormal; Notable for the following components:   RBC 4.03 (*)    HCT 38.3 (*)    All other components within normal limits  RESP PANEL BY RT-PCR (RSV, FLU A&B, COVID)  [id number]  CULTURE, BLOOD (ROUTINE X 2)  CULTURE, BLOOD (ROUTINE X 2)  LACTIC ACID, PLASMA  TROPONIN I (HIGH SENSITIVITY)  TROPONIN I (HIGH SENSITIVITY)     EKG  And interpreted by me at 830 heart rate 70 QRS 90 QTc 430 Normal sinus rhythm no evidence of acute ischemia or ectopy   RADIOLOGY  CT Angio Chest PE W and/or Wo Contrast Result Date: 09/29/2023 CLINICAL DATA:  Evaluate for pulmonary embolism.  High probability. EXAM: CT ANGIOGRAPHY CHEST WITH CONTRAST TECHNIQUE: Multidetector CT  imaging of the chest was performed using the standard protocol during bolus administration of intravenous contrast. Multiplanar CT image reconstructions and MIPs were obtained to evaluate the vascular anatomy. RADIATION DOSE REDUCTION: This exam was performed according to the departmental dose-optimization program which includes automated exposure control, adjustment of the mA and/or kV according to patient size and/or use of iterative reconstruction technique. CONTRAST:  75mL OMNIPAQUE  IOHEXOL  350 MG/ML SOLN COMPARISON:  06/25/2023 FINDINGS: Cardiovascular: Satisfactory opacification of the pulmonary arteries to the segmental level. No evidence of pulmonary embolism. Normal heart size. No pericardial effusion. Aortic atherosclerosis and coronary artery calcifications. Mediastinum/Nodes: Trachea is patent and midline. Frothy debris is noted along the dependent portion of the trachea and extending into the right mainstem bronchus. The esophagus and thyroid gland demonstrate no significant findings. No mediastinal adenopathy.  Left hilar lymph node measures 1.1 cm, image 147/5. Previously 0.7 cm. AP window lymph node measures 0.8 cm, image 116/5. Previously 0.5 cm. Lungs/Pleura: Emphysema with diffuse bronchial wall thickening. Small left pleural effusion is new from previous exam. Airspace disease is noted within the lingula, left lower lobe and right middle lobe. Focal area of nodular consolidation within the inferior right middle lobe measures 1 cm, image 236/5. New from previous exam. Patchy airspace density within the posterolateral right upper lobe. There is been progressive masslike architectural distortion with thickening of the oblique fissure in the region of the previously treated posterolateral left upper lobe lung nodule. This area measures 3.0 by 1.6 cm, image 138/5. The underlying treated lung lesion is indistinguishable from these changes. Upper Abdomen: No acute abnormality within the imaged portions of the upper abdomen. Musculoskeletal: No acute or suspicious osseous findings. Review of the MIP images confirms the above findings. IMPRESSION: 1. No evidence for acute pulmonary embolus. 2. Progressive masslike architectural distortion with thickening of the oblique fissure in the region of the previously treated left upper lobe lung nodule. The underlying treated lung lesion is indistinguishable from these changes. These findings likely reflect progressive changes due to external beam radiation. Recurrent malignancy would be difficult to exclude with a high degree of probability and follow-up imaging is advised to ensure resolution or stability. 3. Airspace disease is noted within the lingula, left lower lobe and right middle lobe. Findings are favored to represent multifocal pneumonia. 4. New focal area of nodular consolidation within the inferior right middle lobe measures 1 cm. This is favored to represent a focal area of infection or inflammation. Attention on follow-up imaging is advised to ensure resolution and to  rule out new pulmonary metastasis. 5. Mildly enlarged left hilar and AP window lymph nodes are favored to be reactive. 6. Coronary artery calcifications. 7. Aortic Atherosclerosis (ICD10-I70.0) and Emphysema (ICD10-J43.9). Electronically Signed   By: Waddell Calk M.D.   On: 09/29/2023 11:10   DG Chest 2 View Result Date: 09/29/2023 CLINICAL DATA:  Chest pain and shortness of breath. EXAM: CHEST - 2 VIEW COMPARISON:  06/25/2023 FINDINGS: New asymmetric opacities identified within the left mid and left lower lung as well as the right lung base. No signs of pleural effusion or edema. The visualized osseous structures are notable for a healed left clavicle fracture. IMPRESSION: New asymmetric opacities within the left mid and left lower lung as well as the right lung base. Findings are concerning for multifocal pneumonia. Electronically Signed   By: Waddell Calk M.D.   On: 09/29/2023 09:28    Chest x-ray imaging interpreted by me is concerning for pneumonia, also would consider however  potential for cancer recurrence based on chest x-ray  PROCEDURES:  Critical Care performed: No  Procedures   MEDICATIONS ORDERED IN ED: Medications  lactated ringers  infusion ( Intravenous New Bag/Given 09/29/23 1241)  pantoprazole  (PROTONIX ) EC tablet 40 mg (has no administration in time range)  tamsulosin  (FLOMAX ) capsule 0.4 mg (has no administration in time range)  clonazePAM  (KLONOPIN ) tablet 1 mg (has no administration in time range)  morphine  (PF) 4 MG/ML injection 4 mg (4 mg Intravenous Given 09/29/23 0929)  iohexol  (OMNIPAQUE ) 350 MG/ML injection 75 mL (75 mLs Intravenous Contrast Given 09/29/23 1015)  cefTRIAXone  (ROCEPHIN ) 2 g in sodium chloride  0.9 % 100 mL IVPB (0 g Intravenous Stopped 09/29/23 1239)  azithromycin  (ZITHROMAX ) 500 mg in sodium chloride  0.9 % 250 mL IVPB (0 mg Intravenous Stopped 09/29/23 1524)  morphine  (PF) 4 MG/ML injection 4 mg (4 mg Intravenous Given 09/29/23 1240)     IMPRESSION / MDM  / ASSESSMENT AND PLAN / ED COURSE  I reviewed the triage vital signs and the nursing notes.                              Differential diagnosis includes, but is not limited to, ACS, aortic dissection, pulmonary embolism, cardiac tamponade, pneumothorax, pneumonia, pericarditis, myocarditis, GI-related causes including esophagitis/gastritis, and musculoskeletal chest wall pain.     Based on workup, most likely community-acquired pneumonia.  No obvious risk factors for healthcare associated pneumonia noted at this time.  Will start on antibiotic therapy, code sepsis initiated, lactic acid normal.  Pain improving with morphine .  Thus far no evidence of acute cardiac or cause such as pulmonary embolism.  Patient understanding agreeable plan for admission based on concerns for symptomatology as well as multifocal pneumonia  Patient's presentation is most consistent with acute complicated illness / injury requiring diagnostic workup.   The patient is on the cardiac monitor to evaluate for evidence of arrhythmia and/or significant heart rate changes.   Clinical Course as of 09/29/23 1700  Sat Sep 29, 2023  1520 Consulted with and patient accepted to hospital service by Dr. Soledad [MQ]  [phone] Patient understanding agreeable with plan for admission [MQ]    Clinical Course User Index [MQ] Dicky Anes, MD     FINAL CLINICAL IMPRESSION(S) / ED DIAGNOSES   Final diagnoses:  Community acquired pneumonia of left lung, unspecified part of lung     Rx / DC Orders   ED Discharge Orders     None        Note:  This document was prepared using Dragon voice recognition software and may include unintentional dictation errors.   Dicky Anes, MD 09/29/23 1700

## 2023-09-29 NOTE — Assessment & Plan Note (Signed)
 The patient is found to have multifocal pneumonia with infiltrates in the Left lingula, LLL, and RML. He has been started on Ceftriaxone  and Azithromycin .
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
## 2023-09-29 NOTE — Assessment & Plan Note (Signed)
 Left sided. Pt will receive oxycodone  for pain control.

## 2023-09-29 NOTE — Assessment & Plan Note (Signed)
 The patient's BMI is 18.32. He has cachectic limbs and has temporal wasting. He likely has moderate protein calorie malnutrition. Will consult nutrition and start protein shakes.

## 2023-09-29 NOTE — Sepsis Progress Note (Signed)
 Elink following code sepsis  1131- provider and bedside Rn notified no lactic acids are ordered, requested them to be ordered

## 2023-09-29 NOTE — ED Triage Notes (Addendum)
 Pt c/o L sided chest pain and SOB x3 days.  Pain score 9/10.  Hx of Lung CA and anxiety, but has not received treatment in 2 years.  Pt is hard of hearing.

## 2023-09-30 ENCOUNTER — Other Ambulatory Visit: Payer: Self-pay

## 2023-09-30 ENCOUNTER — Encounter: Payer: Self-pay | Admitting: Internal Medicine

## 2023-09-30 DIAGNOSIS — J189 Pneumonia, unspecified organism: Secondary | ICD-10-CM | POA: Diagnosis not present

## 2023-09-30 DIAGNOSIS — Z85048 Personal history of other malignant neoplasm of rectum, rectosigmoid junction, and anus: Secondary | ICD-10-CM | POA: Insufficient documentation

## 2023-09-30 LAB — HIV ANTIBODY (ROUTINE TESTING W REFLEX): HIV Screen 4th Generation wRfx: NONREACTIVE

## 2023-09-30 MED ORDER — HYDROCODONE-ACETAMINOPHEN 5-325 MG PO TABS: 1.0000 | ORAL_TABLET | Freq: Four times a day (QID) | ORAL | 0 refills | Status: DC | PRN

## 2023-09-30 MED ORDER — AMOXICILLIN-POT CLAVULANATE 875-125 MG PO TABS: 1.0000 | ORAL_TABLET | Freq: Two times a day (BID) | ORAL | 0 refills | Status: DC

## 2023-09-30 MED ORDER — AZITHROMYCIN 250 MG PO TABS: 500.0000 mg | ORAL_TABLET | Freq: Once | ORAL | 0 refills | Status: AC

## 2023-09-30 NOTE — ED Notes (Signed)
 Pt was noted to start becoming hypotensive even though LR infusion had finished.  Erminio HERO. Np was notified of same at this time  Advocated for pt to receive 500ml bolus.

## 2023-09-30 NOTE — ED Notes (Signed)
 Pt state that he has been In pain for hours. The pt did not call out for the past 6 hours due to being asleep. When brought to his attention he said  so   you have done nothing for my pain. The pt was informed that he has been receiving pain medication since he has been in the ED. Even though it is no longer Morphine  that he is receiving and wants his pain is still being treated approprietly.

## 2023-09-30 NOTE — Care Management CC44 (Signed)
 Condition Code 44 Documentation Completed  Patient Details  Name: [name] [name] MRN: [id number] Date of Birth: 1960/07/06   Condition Code 44 given:   Yes  Patient signature on Condition Code 44 notice:    Documentation of 2 MD's agreement:   Yes  Code 44 added to claim:   Yes     Marinda Cooks, RN 09/30/2023, 1:10 PM
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
## 2023-09-30 NOTE — ED Notes (Signed)
 Did not hear back with regards to another fluid bolus.  Pt's MAP has been stable throughout the night.

## 2023-10-05 LAB — CULTURE, BLOOD (ROUTINE X 2)
Culture: NO GROWTH
Culture: NO GROWTH

## 2023-10-08 ENCOUNTER — Telehealth: Payer: Self-pay | Admitting: *Deleted

## 2023-10-08 NOTE — Telephone Encounter (Signed)
 Pt called in to report that he tolerates the brand name Klonopin  better than the generic and requests future refills be written for Klonopin  if possible. Informed pt to call back when next refill is due to refill prescription for brand name Klonopin  instead of clonazepam . Pt verbalized understanding.

## 2023-10-15 ENCOUNTER — Telehealth: Payer: Self-pay | Admitting: *Deleted

## 2023-10-15 NOTE — Telephone Encounter (Signed)
 Pt left message requesting call back to discuss recent concerns. Attempted to contact pt but pt did not answer and unable to leave a message at this time. Will try again later.

## 2023-10-16 ENCOUNTER — Inpatient Hospital Stay: Attending: Hospice and Palliative Medicine | Admitting: Hospice and Palliative Medicine

## 2023-10-16 DIAGNOSIS — C20 Malignant neoplasm of rectum: Secondary | ICD-10-CM

## 2023-10-16 NOTE — Progress Notes (Signed)
Unable to reach patient by phone.  Will reschedule.

## 2023-11-02 ENCOUNTER — Inpatient Hospital Stay (HOSPITAL_BASED_OUTPATIENT_CLINIC_OR_DEPARTMENT_OTHER): Admitting: Hospice and Palliative Medicine

## 2023-11-02 ENCOUNTER — Other Ambulatory Visit: Payer: Self-pay | Admitting: *Deleted

## 2023-11-02 ENCOUNTER — Telehealth: Payer: Self-pay | Admitting: *Deleted

## 2023-11-02 ENCOUNTER — Inpatient Hospital Stay

## 2023-11-02 ENCOUNTER — Inpatient Hospital Stay: Attending: Hospice and Palliative Medicine

## 2023-11-02 ENCOUNTER — Encounter: Payer: Self-pay | Admitting: Hospice and Palliative Medicine

## 2023-11-02 ENCOUNTER — Other Ambulatory Visit: Payer: Self-pay

## 2023-11-02 VITALS — BP 119/76 | HR 55 | Temp 97.5°F | Resp 16 | Wt 115.0 lb

## 2023-11-02 DIAGNOSIS — C20 Malignant neoplasm of rectum: Secondary | ICD-10-CM

## 2023-11-02 DIAGNOSIS — R197 Diarrhea, unspecified: Secondary | ICD-10-CM | POA: Insufficient documentation

## 2023-11-02 DIAGNOSIS — F32A Depression, unspecified: Secondary | ICD-10-CM | POA: Diagnosis present

## 2023-11-02 DIAGNOSIS — R911 Solitary pulmonary nodule: Secondary | ICD-10-CM | POA: Diagnosis not present

## 2023-11-02 DIAGNOSIS — Z8701 Personal history of pneumonia (recurrent): Secondary | ICD-10-CM | POA: Insufficient documentation

## 2023-11-02 LAB — CBC WITH DIFFERENTIAL/PLATELET
Abs Immature Granulocytes: 0.01 K/uL (ref 0.00–0.07)
Basophils Absolute: 0 K/uL (ref 0.0–0.1)
Basophils Relative: 1 %
Eosinophils Absolute: 0.2 K/uL (ref 0.0–0.5)
Eosinophils Relative: 3 %
HCT: 36 % — ABNORMAL LOW (ref 39.0–52.0)
Hemoglobin: 12.2 g/dL — ABNORMAL LOW (ref 13.0–17.0)
Immature Granulocytes: 0 %
Lymphocytes Relative: 25 %
Lymphs Abs: 1.5 K/uL (ref 0.7–4.0)
MCH: 32.6 pg (ref 26.0–34.0)
MCHC: 33.9 g/dL (ref 30.0–36.0)
MCV: 96.3 fL (ref 80.0–100.0)
Monocytes Absolute: 0.4 K/uL (ref 0.1–1.0)
Monocytes Relative: 6 %
Neutro Abs: 3.8 K/uL (ref 1.7–7.7)
Neutrophils Relative %: 65 %
Platelets: 144 K/uL — ABNORMAL LOW (ref 150–400)
RBC: 3.74 MIL/uL — ABNORMAL LOW (ref 4.22–5.81)
RDW: 12.7 % (ref 11.5–15.5)
WBC: 5.9 K/uL (ref 4.0–10.5)
nRBC: 0 % (ref 0.0–0.2)

## 2023-11-02 LAB — COMPREHENSIVE METABOLIC PANEL WITH GFR
ALT: 12 U/L (ref 0–44)
AST: 19 U/L (ref 15–41)
Albumin: 4.2 g/dL (ref 3.5–5.0)
Alkaline Phosphatase: 73 U/L (ref 38–126)
Anion gap: 6 (ref 5–15)
BUN: 18 mg/dL (ref 8–23)
CO2: 27 mmol/L (ref 22–32)
Calcium: 9.4 mg/dL (ref 8.9–10.3)
Chloride: 107 mmol/L (ref 98–111)
Creatinine, Ser: 0.77 mg/dL (ref 0.61–1.24)
GFR, Estimated: >60 mL/min (ref >60)
Glucose, Bld: 93 mg/dL (ref 70–99)
Potassium: 3.9 mmol/L (ref 3.5–5.1)
Sodium: 140 mmol/L (ref 135–145)
Total Bilirubin: 0.6 mg/dL (ref 0.0–1.2)
Total Protein: 7 g/dL (ref 6.5–8.1)

## 2023-11-02 LAB — SAMPLE TO BLOOD BANK

## 2023-11-02 LAB — MAGNESIUM: Magnesium: 2 mg/dL (ref 1.7–2.4)

## 2023-11-02 NOTE — Telephone Encounter (Signed)
 Patient called and said he had been in the hospital he got 2 antibiotics and ever since then he is having stomach upset black stools 10 times a day sometimes.  I spoke to [employer] and he said that he could come in and get labs see Josh and if he needs IV fluids.  I called the patient at 12:15 and told him he could be here at 1:00 and he says yes
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
## 2023-11-05 ENCOUNTER — Other Ambulatory Visit: Payer: Self-pay | Admitting: *Deleted

## 2023-11-05 ENCOUNTER — Telehealth: Payer: Self-pay | Admitting: *Deleted

## 2023-11-05 DIAGNOSIS — F419 Anxiety disorder, unspecified: Secondary | ICD-10-CM

## 2023-11-05 DIAGNOSIS — F32A Depression, unspecified: Secondary | ICD-10-CM

## 2023-11-05 MED ORDER — CLONAZEPAM 1 MG PO TABS: 1.0000 mg | ORAL_TABLET | Freq: Two times a day (BID) | ORAL | 1 refills | Status: DC

## 2023-11-05 NOTE — Telephone Encounter (Signed)
 Pt requesting refill of klonopin  to [facility] on [address]. States will be due for refill this weekend and would like rx sent into pharmacy so pharmacy can order prescription.

## 2023-11-05 NOTE — Telephone Encounter (Signed)
 I spoke to the patient and let him know that only 1 that has the red stuff in it that he has to have enough stool to go to the orange line of the sticker and on the other specimen he just needs to put some of the diarrhea he has been having into the other cup.  I told him that he would have to have a date and a time of that being done and if he cannot write it on the bottle just to put a sticky note or a piece of paper with the date and time he collected it.  And he thinks he is going to bring it over today

## 2023-11-06 ENCOUNTER — Inpatient Hospital Stay

## 2023-11-06 ENCOUNTER — Other Ambulatory Visit: Payer: Self-pay

## 2023-11-06 ENCOUNTER — Inpatient Hospital Stay (HOSPITAL_BASED_OUTPATIENT_CLINIC_OR_DEPARTMENT_OTHER): Admitting: Hospice and Palliative Medicine

## 2023-11-06 VITALS — BP 137/73 | HR 65 | Temp 98.1°F | Resp 16 | Wt 116.0 lb

## 2023-11-06 DIAGNOSIS — F419 Anxiety disorder, unspecified: Secondary | ICD-10-CM

## 2023-11-06 DIAGNOSIS — F32A Depression, unspecified: Secondary | ICD-10-CM

## 2023-11-06 DIAGNOSIS — C20 Malignant neoplasm of rectum: Secondary | ICD-10-CM

## 2023-11-06 DIAGNOSIS — R197 Diarrhea, unspecified: Secondary | ICD-10-CM | POA: Diagnosis not present

## 2023-11-06 LAB — GASTROINTESTINAL PANEL BY PCR, STOOL (REPLACES STOOL CULTURE)

## 2023-11-06 LAB — C DIFFICILE QUICK SCREEN W PCR REFLEX
C Diff antigen: NEGATIVE
C Diff interpretation: NOT DETECTED
C Diff toxin: NEGATIVE

## 2023-11-06 NOTE — Progress Notes (Signed)
 [facility] CSW Progress Note  Clinical Child psychotherapist contacted patient by phone to follow-up on emotional support.    Interventions: Patient interviewed and appropriate assessments performed: brief mental health assessment   Patient denied SI.  He complained of not being able to sleep and that nothing helps.  Attempted to provide tools to help cope with anxiety/depression.    Received a callback from [employer] who had met with patient.  He reported that patient denied SI and wanted to live.  Officer stated he will make a report regarding concern from CSW.      Follow Up Plan:  CSW will follow-up with patient by phone     [name] CHRISTELLA Au, LCSW Clinical Social Worker [facility]    Patient is participating in a Managed Medicaid Plan:  Yes

## 2023-11-06 NOTE — Progress Notes (Signed)
 [facility] CSW Progress Note  Clinical Child psychotherapist received request from [employer] to assess patient for safety.  Patient refused to wait for CSW and left the cancer center.  Spoke with NP regarding concerns.  Attempted to contact patient by phone and left a voicemail with contact information.  Also spoke with [facility] in [place] and requested a wellness check.  Informed them that patient denied SI and did not have a plan, but made reference to his brothers suicide two years ago.   Follow Up Plan:  CSW will follow-up with patient by phone     Macario CHRISTELLA Au, LCSW Clinical Social Worker [facility]    Patient is participating in a Managed Medicaid Plan:  Yes

## 2023-11-06 NOTE — Progress Notes (Signed)
 Patient was re-referred to [facility] on 11/06/2023.  Patient is enrolled in services and initial behavioral health evaluation was conducted on 06/14/2023.  Patient was a no-show for the latest scheduled follow-up visit on 10/08/2023.  We are reaching out to the patient to get him rescheduled.
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
## 2023-12-03 ENCOUNTER — Encounter: Payer: Self-pay | Admitting: Internal Medicine

## 2023-12-03 ENCOUNTER — Inpatient Hospital Stay

## 2023-12-03 ENCOUNTER — Inpatient Hospital Stay (HOSPITAL_BASED_OUTPATIENT_CLINIC_OR_DEPARTMENT_OTHER): Admitting: Internal Medicine

## 2023-12-03 ENCOUNTER — Inpatient Hospital Stay: Attending: Hospice and Palliative Medicine

## 2023-12-03 VITALS — BP 127/79 | HR 69 | Temp 98.6°F | Resp 20 | Ht 67.0 in | Wt 117.9 lb

## 2023-12-03 DIAGNOSIS — C20 Malignant neoplasm of rectum: Secondary | ICD-10-CM

## 2023-12-03 DIAGNOSIS — G629 Polyneuropathy, unspecified: Secondary | ICD-10-CM | POA: Diagnosis not present

## 2023-12-03 DIAGNOSIS — Z87891 Personal history of nicotine dependence: Secondary | ICD-10-CM | POA: Insufficient documentation

## 2023-12-03 DIAGNOSIS — R911 Solitary pulmonary nodule: Secondary | ICD-10-CM

## 2023-12-03 DIAGNOSIS — R309 Painful micturition, unspecified: Secondary | ICD-10-CM | POA: Diagnosis not present

## 2023-12-03 DIAGNOSIS — R39198 Other difficulties with micturition: Secondary | ICD-10-CM | POA: Diagnosis not present

## 2023-12-03 DIAGNOSIS — K5900 Constipation, unspecified: Secondary | ICD-10-CM | POA: Insufficient documentation

## 2023-12-03 DIAGNOSIS — F419 Anxiety disorder, unspecified: Secondary | ICD-10-CM | POA: Diagnosis not present

## 2023-12-03 DIAGNOSIS — R918 Other nonspecific abnormal finding of lung field: Secondary | ICD-10-CM | POA: Insufficient documentation

## 2023-12-03 LAB — CMP (CANCER CENTER ONLY)
ALT: 15 U/L (ref 0–44)
AST: 25 U/L (ref 15–41)
Albumin: 4.7 g/dL (ref 3.5–5.0)
Alkaline Phosphatase: 78 U/L (ref 38–126)
Anion gap: 11 (ref 5–15)
BUN: 14 mg/dL (ref 8–23)
CO2: 25 mmol/L (ref 22–32)
Calcium: 9.4 mg/dL (ref 8.9–10.3)
Chloride: 100 mmol/L (ref 98–111)
Creatinine: 0.97 mg/dL (ref 0.61–1.24)
GFR, Estimated: >60 mL/min (ref >60)
Glucose, Bld: 108 mg/dL — ABNORMAL HIGH (ref 70–99)
Potassium: 3.6 mmol/L (ref 3.5–5.1)
Sodium: 136 mmol/L (ref 135–145)
Total Bilirubin: 1.2 mg/dL (ref 0.0–1.2)
Total Protein: 7.7 g/dL (ref 6.5–8.1)

## 2023-12-03 LAB — CBC (CANCER CENTER ONLY)
HCT: 41.4 % (ref 39.0–52.0)
Hemoglobin: 13.8 g/dL (ref 13.0–17.0)
MCH: 32.1 pg (ref 26.0–34.0)
MCHC: 33.3 g/dL (ref 30.0–36.0)
MCV: 96.3 fL (ref 80.0–100.0)
Platelet Count: 176 K/uL (ref 150–400)
RBC: 4.3 MIL/uL (ref 4.22–5.81)
RDW: 13.2 % (ref 11.5–15.5)
WBC Count: 10.4 K/uL (ref 4.0–10.5)
nRBC: 0 % (ref 0.0–0.2)

## 2023-12-03 NOTE — Assessment & Plan Note (Addendum)
#   OCT 2022-Rectal cancer-  locally advanced disease. MRI- OCT 2022- T4bN2; ? M [see below]; JULY 2023-  S/p Low anterior resection with hand-sewn coloanal anastomosis, DLI on 10/03/21 by Dr. Mariea; [id number].  CT scan in July 25th 2024- Unchanged appearance of the low rectum with mild circumferential wall thickening. No discretely visualized rectal mass. No evidence of lymphadenopathy or other metastatic disease in the chest, abdomen, or pelvis. Did not get his CT done.  Patient s/p [facility] evaluation- did not get colonoscopy as planned on 02/19/23. Recommend calling [facility] re: re-scheduling.   # DEC 2023- LUL- Hypermetabolic 11 mm solid [primary bronchogenic neoplasm versus metastatic disease [declined biopsy].  S/p SBRT finished March 12th 2024. JULY 2025- [ER] Progressive masslike architectural distortion with thickening of the oblique fissure in the region of the previously treated left upper lobe lung nodule. The underlying treated lung lesion is indistinguishable from these changes. These findings likely reflect progressive changes due to external beam radiation. New focal area of nodular consolidation within the inferior right middle lobe measures 1 cm. Recurrent malignancy would be difficult to exclude with a high degree of probability and follow-up imaging is advised to ensure resolution or stability.  Recommend a follow-up CT scan in 4 months.  # JULY 2025- CT 2025- Airspace disease is noted within the lingula, left lower lobe and right middle lobe.s/p Zithromax -   # pain from surgery [July 10th, 2023]-continues to complain of chronic pain.  Given history of narcotic abuse/would not refill any narcotic pain medication.  Offered referral to pain clinic patient declined. stable.   # Peripheral Neuropathy-/joint pain/ and tingling and numbness in hands and feet- stopped sec to Lyrica  75 mg sec to gait issues- stable.   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  Stable.   # Difficulty with urination- refer to urology  # Constipation: will refill linzess .   # IV Access: PIV-  s/p . Explantation.      Fleeta pt  # DISPOSITION: #  Difficulty with urination- refer to urology #  Follow up in 4 months- MD; labs- cbc/cmp; CEA- CT chest prior-Dr.B  # I reviewed the blood work- with the patient in detail; also reviewed the imaging independently [as summarized above]; and with the patient in detail.
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
## 2023-12-03 NOTE — Progress Notes (Signed)
 Patient has no concerns

## 2023-12-04 LAB — CEA: CEA: 4.7 ng/mL (ref 0.0–4.7)

## 2023-12-07 ENCOUNTER — Encounter: Payer: Self-pay | Admitting: Urology

## 2023-12-20 ENCOUNTER — Ambulatory Visit: Admitting: Urology

## 2023-12-31 ENCOUNTER — Other Ambulatory Visit: Payer: Self-pay | Admitting: *Deleted

## 2023-12-31 MED ORDER — TAMSULOSIN HCL 0.4 MG PO CAPS: 0.4000 mg | ORAL_CAPSULE | Freq: Every day | ORAL | 2 refills | Status: AC

## 2023-12-31 NOTE — Telephone Encounter (Signed)
 Patient called saying that he needs a refill of his tamsulosin  Dr. Rennie was fine but that.

## 2024-01-09 ENCOUNTER — Other Ambulatory Visit: Payer: Self-pay | Admitting: Hospice and Palliative Medicine

## 2024-01-09 ENCOUNTER — Telehealth: Payer: Self-pay | Admitting: *Deleted

## 2024-01-09 DIAGNOSIS — F32A Depression, unspecified: Secondary | ICD-10-CM

## 2024-01-09 DIAGNOSIS — R399 Unspecified symptoms and signs involving the genitourinary system: Secondary | ICD-10-CM | POA: Insufficient documentation

## 2024-01-09 DIAGNOSIS — R972 Elevated prostate specific antigen [PSA]: Secondary | ICD-10-CM | POA: Insufficient documentation

## 2024-01-09 NOTE — Telephone Encounter (Signed)
 RN called pt to discern when he ran out of clonazepam . He states he just ran out yesterday. I voiced concern that he should have been out in September as the last time we refilled that prescription was in August, pt raised his voice and said he got them from [facility] . I stated we cannot keep filling  these medications as he is no longer on active treatment. He does not have a PCP. Provider is attempting to make a referral to Psychiatry or an addiction program. Pt began shouting that he will have a seizure and die without this medication. He states the cancer center has been filling this medication for 3 years and by tokelau they are gonna keep giving them to me. They have to give them to me. Pt asks who he needs to make an appt with at the [facility] since Dr B is out of the country as that is not the patients problem. Pt promptly hung up the phone.
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
## 2024-01-09 NOTE — Assessment & Plan Note (Deleted)
 PSA 18.4 (Nov 2022)   - collected during pre-op for advanced rectal Ca   - no follow up PSA data  - repeat PSA today  - if elevated, proceed with dedicated prostate MRI

## 2024-01-09 NOTE — Assessment & Plan Note (Deleted)
 25-30g gland (via CT estimation Mar 2025)    - possible posterior median lobe  Hx of rectal Ca s/p chemoXRT + LAR (2023)

## 2024-01-10 ENCOUNTER — Inpatient Hospital Stay: Attending: Hospice and Palliative Medicine | Admitting: Hospice and Palliative Medicine

## 2024-01-10 ENCOUNTER — Encounter: Payer: Self-pay | Admitting: Hospice and Palliative Medicine

## 2024-01-10 ENCOUNTER — Other Ambulatory Visit: Payer: Self-pay | Admitting: *Deleted

## 2024-01-10 ENCOUNTER — Encounter: Payer: Self-pay | Admitting: Internal Medicine

## 2024-01-10 ENCOUNTER — Telehealth: Payer: Self-pay

## 2024-01-10 VITALS — BP 124/87 | HR 81 | Temp 97.3°F | Resp 16 | Wt 116.8 lb

## 2024-01-10 DIAGNOSIS — C801 Malignant (primary) neoplasm, unspecified: Secondary | ICD-10-CM

## 2024-01-10 DIAGNOSIS — C20 Malignant neoplasm of rectum: Secondary | ICD-10-CM | POA: Diagnosis present

## 2024-01-10 DIAGNOSIS — Z85048 Personal history of other malignant neoplasm of rectum, rectosigmoid junction, and anus: Secondary | ICD-10-CM

## 2024-01-10 DIAGNOSIS — Z79899 Other long term (current) drug therapy: Secondary | ICD-10-CM | POA: Diagnosis not present

## 2024-01-10 DIAGNOSIS — F419 Anxiety disorder, unspecified: Secondary | ICD-10-CM | POA: Insufficient documentation

## 2024-01-10 DIAGNOSIS — R569 Unspecified convulsions: Secondary | ICD-10-CM | POA: Insufficient documentation

## 2024-01-10 DIAGNOSIS — R911 Solitary pulmonary nodule: Secondary | ICD-10-CM | POA: Diagnosis not present

## 2024-01-10 DIAGNOSIS — G629 Polyneuropathy, unspecified: Secondary | ICD-10-CM | POA: Insufficient documentation

## 2024-01-10 DIAGNOSIS — F319 Bipolar disorder, unspecified: Secondary | ICD-10-CM

## 2024-01-10 DIAGNOSIS — F32A Depression, unspecified: Secondary | ICD-10-CM | POA: Insufficient documentation

## 2024-01-10 DIAGNOSIS — F1190 Opioid use, unspecified, uncomplicated: Secondary | ICD-10-CM

## 2024-01-10 DIAGNOSIS — F332 Major depressive disorder, recurrent severe without psychotic features: Secondary | ICD-10-CM

## 2024-01-10 LAB — URINE DRUG SCREEN, QUALITATIVE (ARMC ONLY)
Amphetamines, Ur Screen: NOT DETECTED
Barbiturates, Ur Screen: NOT DETECTED
Benzodiazepine, Ur Scrn: NOT DETECTED
Cannabinoid 50 Ng, Ur ~~LOC~~: POSITIVE — AB
Cocaine Metabolite,Ur ~~LOC~~: NOT DETECTED
MDMA (Ecstasy)Ur Screen: NOT DETECTED
Methadone Scn, Ur: NOT DETECTED
Opiate, Ur Screen: NOT DETECTED
Phencyclidine (PCP) Ur S: NOT DETECTED
Tricyclic, Ur Screen: NOT DETECTED

## 2024-01-10 MED ORDER — CLONAZEPAM 1 MG PO TABS: ORAL_TABLET | ORAL | 0 refills | Status: AC
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
## 2024-01-14 ENCOUNTER — Ambulatory Visit: Admitting: Urology

## 2024-01-14 ENCOUNTER — Inpatient Hospital Stay

## 2024-01-14 DIAGNOSIS — R399 Unspecified symptoms and signs involving the genitourinary system: Secondary | ICD-10-CM

## 2024-01-14 DIAGNOSIS — R972 Elevated prostate specific antigen [PSA]: Secondary | ICD-10-CM

## 2024-01-14 LAB — MISC LABCORP TEST (SEND OUT): Labcorp test code: 791194

## 2024-01-18 ENCOUNTER — Encounter: Payer: Self-pay | Admitting: Internal Medicine

## 2024-01-18 NOTE — Telephone Encounter (Signed)
  error

## 2024-01-22 ENCOUNTER — Encounter: Payer: Self-pay | Admitting: Urology

## 2024-01-25 ENCOUNTER — Encounter: Payer: Self-pay | Admitting: Internal Medicine

## 2024-02-19 ENCOUNTER — Ambulatory Visit: Admitting: Student

## 2024-03-28 ENCOUNTER — Telehealth: Payer: Self-pay

## 2024-03-28 NOTE — Telephone Encounter (Signed)
 Unable to reach patient to confirm fleeta pick up for appt. Patients vm was full unable to leave message.

## 2024-03-31 ENCOUNTER — Ambulatory Visit: Admission: RE | Admit: 2024-03-31 | Source: Ambulatory Visit

## 2024-03-31 ENCOUNTER — Inpatient Hospital Stay: Attending: Hospice and Palliative Medicine

## 2024-04-01 ENCOUNTER — Telehealth: Payer: Self-pay | Admitting: Internal Medicine

## 2024-04-01 NOTE — Telephone Encounter (Signed)
 Called to r/s CT - left vm for return call - [facility]

## 2024-04-04 ENCOUNTER — Telehealth: Payer: Self-pay | Admitting: Internal Medicine

## 2024-04-04 NOTE — Telephone Encounter (Signed)
 Called pt to r/s CT - vm is full - [facility]

## 2024-04-09 ENCOUNTER — Telehealth: Payer: Self-pay

## 2024-04-09 NOTE — Telephone Encounter (Signed)
 Attempted to contact patient to confirm fleeta transportation for 1/15 appointment. No answer; voicemail was full, unable to leave message.

## 2024-04-10 ENCOUNTER — Inpatient Hospital Stay: Attending: Hospice and Palliative Medicine

## 2024-04-10 ENCOUNTER — Inpatient Hospital Stay

## 2024-04-10 ENCOUNTER — Telehealth: Payer: Self-pay

## 2024-04-10 ENCOUNTER — Encounter: Payer: Self-pay | Admitting: Internal Medicine

## 2024-04-10 ENCOUNTER — Inpatient Hospital Stay: Admitting: Internal Medicine

## 2024-04-10 NOTE — Assessment & Plan Note (Signed)
#   OCT 2022-Rectal cancer-  locally advanced disease. MRI- OCT 2022- T4bN2; ? M [see below]; JULY 2023-  S/p Low anterior resection with hand-sewn coloanal anastomosis, DLI on 10/03/21 by Dr. Mariea; [id number].  CT scan in July 25th 2024- Unchanged appearance of the low rectum with mild circumferential wall thickening. No discretely visualized rectal mass. No evidence of lymphadenopathy or other metastatic disease in the chest, abdomen, or pelvis. Did not get his CT done.  Patient s/p [facility] evaluation- did not get colonoscopy as planned on 02/19/23. Recommend calling [facility] re: re-scheduling.   # DEC 2023- LUL- Hypermetabolic 11 mm solid [primary bronchogenic neoplasm versus metastatic disease [declined biopsy].  S/p SBRT finished March 12th 2024. JULY 2025- [ER] Progressive masslike architectural distortion with thickening of the oblique fissure in the region of the previously treated left upper lobe lung nodule. The underlying treated lung lesion is indistinguishable from these changes. These findings likely reflect progressive changes due to external beam radiation. New focal area of nodular consolidation within the inferior right middle lobe measures 1 cm. Recurrent malignancy would be difficult to exclude with a high degree of probability and follow-up imaging is advised to ensure resolution or stability.  Recommend a follow-up CT scan in 4 months.  # JULY 2025- CT 2025- Airspace disease is noted within the lingula, left lower lobe and right middle lobe.s/p Zithromax -   # pain from surgery [July 10th, 2023]-continues to complain of chronic pain.  Given history of narcotic abuse/would not refill any narcotic pain medication.  Offered referral to pain clinic patient declined. stable.   # Peripheral Neuropathy-/joint pain/ and tingling and numbness in hands and feet- stopped sec to Lyrica  75 mg sec to gait issues- stable.   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  Stable.   # Difficulty with urination- refer to urology  # Constipation: will refill linzess .   # IV Access: PIV-  s/p . Explantation.      Fleeta pt  # DISPOSITION: #  Difficulty with urination- refer to urology #  Follow up in 4 months- MD; labs- cbc/cmp; CEA- CT chest prior-Dr.B  # I reviewed the blood work- with the patient in detail; also reviewed the imaging independently [as summarized above]; and with the patient in detail.

## 2024-04-10 NOTE — Telephone Encounter (Signed)
 Attempted to contact patient again this morning to confirm fleeta transportation for today's appointment. No answer; voicemail was full and unable to leave a message. Attempted to contact patients son; no answer.

## 2024-04-18 ENCOUNTER — Telehealth: Payer: Self-pay | Admitting: Internal Medicine

## 2024-04-18 ENCOUNTER — Encounter: Payer: Self-pay | Admitting: Internal Medicine

## 2024-04-18 NOTE — Telephone Encounter (Signed)
 Pt called to r/s missed appts - pt confirmed new date/time/location for appts - [facility]

## 2024-04-29 ENCOUNTER — Telehealth: Payer: Self-pay

## 2024-04-29 NOTE — Telephone Encounter (Signed)
 Attempted to contact patient to confirm appointment and fleeta pickup for tomorrow. Patients phone number is disconnected and unable to leave a voicemail. Son was also called; no answer and voicemail was left to contact clinic to confirm patients appointment.

## 2024-04-30 ENCOUNTER — Telehealth: Payer: Self-pay

## 2024-04-30 ENCOUNTER — Inpatient Hospital Stay

## 2024-04-30 ENCOUNTER — Ambulatory Visit

## 2024-04-30 ENCOUNTER — Ambulatory Visit: Admission: RE | Admit: 2024-04-30 | Source: Ambulatory Visit

## 2024-04-30 NOTE — Telephone Encounter (Signed)
 Patient called this morning. Contact number was updated in the chart. Patient also requested to reschedule todays appointment.

## 2024-05-07 ENCOUNTER — Inpatient Hospital Stay

## 2024-05-07 ENCOUNTER — Inpatient Hospital Stay: Admitting: Internal Medicine
